# Patient Record
Sex: Female | Born: 1980 | Race: Black or African American | Hispanic: No | Marital: Married | Smoking: Never smoker
Health system: Southern US, Community
[De-identification: ages and names within clinical notes are randomized; demographics above are authoritative.]

## PROBLEM LIST (undated history)

## (undated) DIAGNOSIS — C50919 Malignant neoplasm of unspecified site of unspecified female breast: Secondary | ICD-10-CM

## (undated) DIAGNOSIS — K219 Gastro-esophageal reflux disease without esophagitis: Secondary | ICD-10-CM

## (undated) DIAGNOSIS — T148XXA Other injury of unspecified body region, initial encounter: Secondary | ICD-10-CM

## (undated) DIAGNOSIS — B019 Varicella without complication: Secondary | ICD-10-CM

## (undated) DIAGNOSIS — R112 Nausea with vomiting, unspecified: Secondary | ICD-10-CM

## (undated) DIAGNOSIS — D649 Anemia, unspecified: Secondary | ICD-10-CM

## (undated) DIAGNOSIS — B009 Herpesviral infection, unspecified: Secondary | ICD-10-CM

## (undated) DIAGNOSIS — Z923 Personal history of irradiation: Secondary | ICD-10-CM

## (undated) DIAGNOSIS — Z9889 Other specified postprocedural states: Secondary | ICD-10-CM

## (undated) DIAGNOSIS — C801 Malignant (primary) neoplasm, unspecified: Secondary | ICD-10-CM

## (undated) HISTORY — DX: Varicella without complication: B01.9

## (undated) HISTORY — PX: WISDOM TOOTH EXTRACTION: SHX21

---

## 2000-04-10 ENCOUNTER — Emergency Department (HOSPITAL_COMMUNITY): Admission: EM | Admit: 2000-04-10 | Discharge: 2000-04-10 | Payer: Self-pay | Admitting: Emergency Medicine

## 2000-04-11 ENCOUNTER — Emergency Department (HOSPITAL_COMMUNITY): Admission: EM | Admit: 2000-04-11 | Discharge: 2000-04-11 | Payer: Self-pay | Admitting: Emergency Medicine

## 2000-05-17 ENCOUNTER — Encounter: Payer: Self-pay | Admitting: Emergency Medicine

## 2000-05-17 ENCOUNTER — Emergency Department (HOSPITAL_COMMUNITY): Admission: EM | Admit: 2000-05-17 | Discharge: 2000-05-18 | Payer: Self-pay | Admitting: Emergency Medicine

## 2000-12-07 ENCOUNTER — Emergency Department (HOSPITAL_COMMUNITY): Admission: EM | Admit: 2000-12-07 | Discharge: 2000-12-07 | Payer: Self-pay

## 2000-12-07 ENCOUNTER — Encounter: Payer: Self-pay | Admitting: Emergency Medicine

## 2000-12-15 ENCOUNTER — Emergency Department (HOSPITAL_COMMUNITY): Admission: EM | Admit: 2000-12-15 | Discharge: 2000-12-15 | Payer: Self-pay | Admitting: Emergency Medicine

## 2000-12-18 ENCOUNTER — Encounter: Payer: Self-pay | Admitting: Orthopedic Surgery

## 2000-12-18 ENCOUNTER — Ambulatory Visit (HOSPITAL_COMMUNITY): Admission: RE | Admit: 2000-12-18 | Discharge: 2000-12-18 | Payer: Self-pay | Admitting: Orthopedic Surgery

## 2001-02-26 ENCOUNTER — Encounter: Admission: RE | Admit: 2001-02-26 | Discharge: 2001-05-27 | Payer: Self-pay | Admitting: Orthopedic Surgery

## 2003-03-30 ENCOUNTER — Other Ambulatory Visit: Admission: RE | Admit: 2003-03-30 | Discharge: 2003-03-30 | Payer: Self-pay | Admitting: Obstetrics and Gynecology

## 2003-07-12 ENCOUNTER — Emergency Department (HOSPITAL_COMMUNITY): Admission: EM | Admit: 2003-07-12 | Discharge: 2003-07-13 | Payer: Self-pay | Admitting: Emergency Medicine

## 2004-04-12 ENCOUNTER — Emergency Department (HOSPITAL_COMMUNITY): Admission: EM | Admit: 2004-04-12 | Discharge: 2004-04-12 | Payer: Self-pay

## 2006-04-09 ENCOUNTER — Emergency Department (HOSPITAL_COMMUNITY): Admission: EM | Admit: 2006-04-09 | Discharge: 2006-04-09 | Payer: Self-pay | Admitting: Family Medicine

## 2007-06-02 ENCOUNTER — Other Ambulatory Visit: Admission: RE | Admit: 2007-06-02 | Discharge: 2007-06-02 | Payer: Self-pay | Admitting: *Deleted

## 2009-09-19 ENCOUNTER — Emergency Department (HOSPITAL_COMMUNITY): Admission: EM | Admit: 2009-09-19 | Discharge: 2009-09-19 | Payer: Self-pay | Admitting: Emergency Medicine

## 2011-10-15 ENCOUNTER — Other Ambulatory Visit (HOSPITAL_COMMUNITY): Payer: Self-pay | Admitting: Obstetrics and Gynecology

## 2011-10-15 DIAGNOSIS — N979 Female infertility, unspecified: Secondary | ICD-10-CM

## 2011-10-21 ENCOUNTER — Ambulatory Visit (HOSPITAL_COMMUNITY)
Admission: RE | Admit: 2011-10-21 | Discharge: 2011-10-21 | Disposition: A | Discharge status: Home / Self Care | Payer: 59 | Source: Ambulatory Visit | Attending: Obstetrics and Gynecology | Admitting: Obstetrics and Gynecology

## 2011-10-21 DIAGNOSIS — N979 Female infertility, unspecified: Secondary | ICD-10-CM | POA: Insufficient documentation

## 2011-10-21 MED ORDER — IOHEXOL 300 MG/ML  SOLN
20.0000 mL | Freq: Once | INTRAMUSCULAR | Status: AC | PRN
  Administered 2011-10-21: 20 mL

## 2012-03-19 ENCOUNTER — Encounter (HOSPITAL_COMMUNITY): Payer: Self-pay | Admitting: *Deleted

## 2012-03-19 ENCOUNTER — Emergency Department (INDEPENDENT_AMBULATORY_CARE_PROVIDER_SITE_OTHER)
Admission: EM | Admit: 2012-03-19 | Discharge: 2012-03-19 | Disposition: A | Discharge status: Home / Self Care | Payer: 59 | Source: Home / Self Care | Attending: Emergency Medicine | Admitting: Emergency Medicine

## 2012-03-19 DIAGNOSIS — J209 Acute bronchitis, unspecified: Secondary | ICD-10-CM

## 2012-03-19 DIAGNOSIS — J019 Acute sinusitis, unspecified: Secondary | ICD-10-CM

## 2012-03-19 MED ORDER — BENZONATATE 200 MG PO CAPS: 200.0000 mg | ORAL_CAPSULE | Freq: Three times a day (TID) | ORAL | Status: DC | PRN

## 2012-03-19 MED ORDER — AMOXICILLIN 500 MG PO CAPS: 1000.0000 mg | ORAL_CAPSULE | Freq: Three times a day (TID) | ORAL | Status: DC

## 2012-03-19 NOTE — ED Notes (Signed)
Pt reports productive cough (yellow sputum), body aches, sinus pain and pressure - other family members sick with similar symptoms.

## 2012-03-19 NOTE — ED Provider Notes (Signed)
Chief Complaint  Patient presents with  . URI    History of Present Illness:   The patient is a 32 year old female who presents with a one half week history of cough productive yellow sputum, wheezing, nasal congestion, rhinorrhea, yellowish nasal drainage, headache, maxillary pressure, watering of her eyes, chills, and sore throat. She has not have fever and has had no GI complaints.  Review of Systems:  Other than noted above, the patient denies any of the following symptoms. Systemic:  No fever, chills, sweats, fatigue, myalgias, headache, or anorexia. Eye:  No redness, pain or drainage. ENT:  No earache, ear congestion, nasal congestion, sneezing, rhinorrhea, sinus pressure, sinus pain, post nasal drip, or sore throat. Lungs:  No cough, sputum production, wheezing, shortness of breath, or chest pain. GI:  No abdominal pain, nausea, vomiting, or diarrhea.  PMFSH:  Past medical history, family history, social history, meds, and allergies were reviewed.  Physical Exam:   Vital signs:  BP 109/78  Pulse 86  Temp 99.8 F (37.7 C) (Oral)  Resp 16  SpO2 100%  LMP 03/05/2012 General:  Alert, in no distress. Eye:  No conjunctival injection or drainage. Lids were normal. ENT:  TMs and canals were normal, without erythema or inflammation.  Nasal mucosa was clear and uncongested, without drainage.  Mucous membranes were moist.  Pharynx was clear, without exudate or drainage.  There were no oral ulcerations or lesions. Neck:  Supple, no adenopathy, tenderness or mass. Lungs:  No respiratory distress.  Lungs were clear to auscultation, without wheezes, rales or rhonchi.  Breath sounds were clear and equal bilaterally.  Heart:  Regular rhythm, without gallops, murmers or rubs. Skin:  Clear, warm, and dry, without rash or lesions.  Assessment:  The primary encounter diagnosis was Acute bronchitis. A diagnosis of Acute sinusitis was also pertinent to this visit.  Plan:   1.  The following meds  were prescribed:   New Prescriptions   AMOXICILLIN (AMOXIL) 500 MG CAPSULE    Take 2 capsules (1,000 mg total) by mouth 3 (three) times daily.   BENZONATATE (TESSALON) 200 MG CAPSULE    Take 1 capsule (200 mg total) by mouth 3 (three) times daily as needed for cough.   2.  The patient was instructed in symptomatic care and handouts were given. 3.  The patient was told to return if becoming worse in any way, if no better in 3 or 4 days, and given some red flag symptoms that would indicate earlier return.   Reuben Likes, MD 03/19/12 [id number]

## 2013-12-27 LAB — OB RESULTS CONSOLE HIV ANTIBODY (ROUTINE TESTING): HIV: NONREACTIVE

## 2013-12-27 LAB — OB RESULTS CONSOLE GC/CHLAMYDIA
CHLAMYDIA, DNA PROBE: NEGATIVE
GC PROBE AMP, GENITAL: NEGATIVE

## 2013-12-27 LAB — OB RESULTS CONSOLE RPR: RPR: NONREACTIVE

## 2013-12-27 LAB — OB RESULTS CONSOLE HEPATITIS B SURFACE ANTIGEN: HEP B S AG: NEGATIVE

## 2013-12-27 LAB — OB RESULTS CONSOLE ABO/RH: RH Type: POSITIVE

## 2013-12-27 LAB — OB RESULTS CONSOLE ANTIBODY SCREEN: ANTIBODY SCREEN: NEGATIVE

## 2013-12-27 LAB — OB RESULTS CONSOLE RUBELLA ANTIBODY, IGM: Rubella: IMMUNE

## 2014-03-18 NOTE — L&D Delivery Note (Signed)
Delivery Note At 11:42 PM a viable and healthy female was delivered via Vaginal, Spontaneous Delivery (Presentation: Right Occiput Anterior). Compound left hand  APGAR: 7, 9; weight 8 lb 4.6 oz (3760 g).   Placenta status: Intact, Spontaneous. Not sent Cord: 3 vessels with the following complications: .  Cord pH: n/a  Anesthesia: Epidural  Episiotomy: None Lacerations: 4th degree perineal; bilateral vaginal Sulcus, Suture Repair: 2.0 3.0 chromic vicryl Est. Blood Loss (mL): 1492 Distal fourth deg laceration repaired with )-O- vicryl x 4 figure of eight for sphincter, 4-0 Vicryl for serosa, 3-0 chromic for remaining perineal repair,  2-0 chromic for deep tissue. 3-0 chromic for remain skin closure  rectal exam intact mucosa Mom to postpartum.  Baby to skin to skin.  Hilton Saephan A 07/25/2014, 2:07 AM

## 2014-06-21 LAB — OB RESULTS CONSOLE GBS: GBS: NEGATIVE

## 2014-07-24 ENCOUNTER — Inpatient Hospital Stay (HOSPITAL_COMMUNITY): Payer: 59 | Admitting: Anesthesiology

## 2014-07-24 ENCOUNTER — Encounter (HOSPITAL_COMMUNITY): Payer: Self-pay | Admitting: *Deleted

## 2014-07-24 ENCOUNTER — Inpatient Hospital Stay (HOSPITAL_COMMUNITY)
Admission: AD | Admit: 2014-07-24 | Discharge: 2014-07-27 | DRG: 774 | Disposition: A | Discharge status: Home / Self Care | Payer: 59 | Source: Ambulatory Visit | Attending: Obstetrics and Gynecology | Admitting: Obstetrics and Gynecology

## 2014-07-24 DIAGNOSIS — O9832 Other infections with a predominantly sexual mode of transmission complicating childbirth: Secondary | ICD-10-CM | POA: Diagnosis present

## 2014-07-24 DIAGNOSIS — Z3A40 40 weeks gestation of pregnancy: Secondary | ICD-10-CM | POA: Diagnosis present

## 2014-07-24 DIAGNOSIS — O9081 Anemia of the puerperium: Secondary | ICD-10-CM | POA: Diagnosis not present

## 2014-07-24 DIAGNOSIS — D62 Acute posthemorrhagic anemia: Secondary | ICD-10-CM | POA: Diagnosis not present

## 2014-07-24 DIAGNOSIS — A6000 Herpesviral infection of urogenital system, unspecified: Secondary | ICD-10-CM | POA: Diagnosis present

## 2014-07-24 DIAGNOSIS — O480 Post-term pregnancy: Principal | ICD-10-CM | POA: Diagnosis present

## 2014-07-24 HISTORY — DX: Herpesviral infection, unspecified: B00.9

## 2014-07-24 LAB — TYPE AND SCREEN
ABO/RH(D): A POS
Antibody Screen: NEGATIVE

## 2014-07-24 LAB — CBC
HCT: 38.1 % (ref 36.0–46.0)
HEMOGLOBIN: 13 g/dL (ref 12.0–15.0)
MCH: 28 pg (ref 26.0–34.0)
MCHC: 34.1 g/dL (ref 30.0–36.0)
MCV: 81.9 fL (ref 78.0–100.0)
Platelets: 241 10*3/uL (ref 150–400)
RBC: 4.65 MIL/uL (ref 3.87–5.11)
RDW: 15.2 % (ref 11.5–15.5)
WBC: 15 10*3/uL — ABNORMAL HIGH (ref 4.0–10.5)

## 2014-07-24 LAB — ABO/RH: ABO/RH(D): A POS

## 2014-07-24 MED ORDER — DIPHENHYDRAMINE HCL 50 MG/ML IJ SOLN: 12.5000 mg | INTRAMUSCULAR | Status: DC | PRN

## 2014-07-24 MED ORDER — OXYCODONE-ACETAMINOPHEN 5-325 MG PO TABS: 1.0000 | ORAL_TABLET | ORAL | Status: DC | PRN

## 2014-07-24 MED ORDER — FLEET ENEMA 7-19 GM/118ML RE ENEM: 1.0000 | ENEMA | RECTAL | Status: DC | PRN

## 2014-07-24 MED ORDER — PHENYLEPHRINE 40 MCG/ML (10ML) SYRINGE FOR IV PUSH (FOR BLOOD PRESSURE SUPPORT)
PREFILLED_SYRINGE | INTRAVENOUS | Status: AC
  Filled 2014-07-24: qty 20

## 2014-07-24 MED ORDER — TERBUTALINE SULFATE 1 MG/ML IJ SOLN: 0.2500 mg | Freq: Once | INTRAMUSCULAR | Status: AC | PRN

## 2014-07-24 MED ORDER — ACETAMINOPHEN 325 MG PO TABS
650.0000 mg | ORAL_TABLET | ORAL | Status: DC | PRN
Start: 2014-07-24 — End: 2014-07-25

## 2014-07-24 MED ORDER — OXYTOCIN 40 UNITS IN LACTATED RINGERS INFUSION - SIMPLE MED
62.5000 mL/h | INTRAVENOUS | Status: DC
  Filled 2014-07-24: qty 1000

## 2014-07-24 MED ORDER — FENTANYL 2.5 MCG/ML BUPIVACAINE 1/10 % EPIDURAL INFUSION (WH - ANES)
INTRAMUSCULAR | Status: AC
  Filled 2014-07-24: qty 125

## 2014-07-24 MED ORDER — LACTATED RINGERS IV SOLN
INTRAVENOUS | Status: DC
  Administered 2014-07-24 (×3): via INTRAVENOUS

## 2014-07-24 MED ORDER — PHENYLEPHRINE 40 MCG/ML (10ML) SYRINGE FOR IV PUSH (FOR BLOOD PRESSURE SUPPORT): 80.0000 ug | PREFILLED_SYRINGE | INTRAVENOUS | Status: DC | PRN

## 2014-07-24 MED ORDER — EPHEDRINE 5 MG/ML INJ: 10.0000 mg | INTRAVENOUS | Status: DC | PRN

## 2014-07-24 MED ORDER — LACTATED RINGERS IV SOLN
INTRAVENOUS | Status: DC
  Administered 2014-07-24 (×2): via INTRAUTERINE

## 2014-07-24 MED ORDER — BUPIVACAINE HCL (PF) 0.25 % IJ SOLN
INTRAMUSCULAR | Status: DC | PRN
  Administered 2014-07-24 (×2): 4 mL

## 2014-07-24 MED ORDER — CITRIC ACID-SODIUM CITRATE 334-500 MG/5ML PO SOLN
30.0000 mL | ORAL | Status: DC | PRN
Start: 2014-07-24 — End: 2014-07-25

## 2014-07-24 MED ORDER — ONDANSETRON HCL 4 MG/2ML IJ SOLN
4.0000 mg | Freq: Four times a day (QID) | INTRAMUSCULAR | Status: DC | PRN
  Administered 2014-07-25: 4 mg via INTRAVENOUS
  Filled 2014-07-24: qty 2

## 2014-07-24 MED ORDER — LIDOCAINE-EPINEPHRINE (PF) 2 %-1:200000 IJ SOLN
INTRAMUSCULAR | Status: DC | PRN
  Administered 2014-07-24: 3 mL

## 2014-07-24 MED ORDER — OXYTOCIN BOLUS FROM INFUSION
500.0000 mL | INTRAVENOUS | Status: DC
  Administered 2014-07-24 – 2014-07-25 (×2): 500 mL via INTRAVENOUS
  Administered 2014-07-25: 200 mL via INTRAVENOUS

## 2014-07-24 MED ORDER — MISOPROSTOL 25 MCG QUARTER TABLET: 25.0000 ug | ORAL_TABLET | ORAL | Status: DC | PRN

## 2014-07-24 MED ORDER — LACTATED RINGERS IV SOLN
500.0000 mL | INTRAVENOUS | Status: DC | PRN
  Administered 2014-07-24 (×3): 500 mL via INTRAVENOUS

## 2014-07-24 MED ORDER — FENTANYL 2.5 MCG/ML BUPIVACAINE 1/10 % EPIDURAL INFUSION (WH - ANES)
14.0000 mL/h | INTRAMUSCULAR | Status: DC | PRN
  Administered 2014-07-24: 14 mL/h via EPIDURAL
  Administered 2014-07-24: 12 mL/h via EPIDURAL
  Administered 2014-07-25: 14 mL/h via EPIDURAL
  Filled 2014-07-24: qty 125

## 2014-07-24 MED ORDER — OXYTOCIN 40 UNITS IN LACTATED RINGERS INFUSION - SIMPLE MED
1.0000 m[IU]/min | INTRAVENOUS | Status: DC
  Administered 2014-07-24: 4 m[IU]/min via INTRAVENOUS
  Administered 2014-07-24: 2 m[IU]/min via INTRAVENOUS
  Filled 2014-07-24: qty 1000

## 2014-07-24 MED ORDER — OXYCODONE-ACETAMINOPHEN 5-325 MG PO TABS: 2.0000 | ORAL_TABLET | ORAL | Status: DC | PRN

## 2014-07-24 MED ORDER — LIDOCAINE HCL (PF) 1 % IJ SOLN
30.0000 mL | INTRAMUSCULAR | Status: DC | PRN
  Administered 2014-07-24: 30 mL via SUBCUTANEOUS
  Filled 2014-07-24: qty 30

## 2014-07-24 NOTE — Progress Notes (Signed)
S. No complaint  O:  Pitocin Epidural  VE LOP 5/90/-1 Mod mec (+) bloody show  Tracing: cat 1 baseline 130's Ctx 1 1/2- 48mins Up to 170 MVU  IMP protracted active phase due to sub optimal ctx and posterior presentation P) cont pitocin cont exaggerated sims position

## 2014-07-24 NOTE — H&P (Signed)
Brianna James is a 34 y.o. female presenting @ term in active labor. GBS cx neg. Intact membrane  Maternal Medical History:  Reason for admission: Contractions.   Contractions: Onset was 6-12 hours ago.   Frequency: regular.   Perceived severity is moderate.    Fetal activity: Perceived fetal activity is normal.    Prenatal complications: no prenatal complications   OB History    Gravida Para Term Preterm AB TAB SAB Ectopic Multiple Living   1              Past Medical History  Diagnosis Date  . HSV infection    Past Surgical History  Procedure Laterality Date  . Wisdom tooth extraction     Family History: Family history is unknown by patient. Social History:  reports that she has never smoked. She does not have any smokeless tobacco history on file. She reports that she does not drink alcohol or use illicit drugs.   Prenatal Transfer Tool  Maternal Diabetes: No Genetic Screening: Normal Maternal Ultrasounds/Referrals: Abnormal:  Findings:   Isolated EIF (echogenic intracardiac focus) Fetal Ultrasounds or other Referrals:  None Maternal Substance Abuse:  No Significant Maternal Medications:  Meds include: Other: valtrex Significant Maternal Lab Results:  Lab values include: Group B Strep negative Other Comments:  None  Review of Systems  All other systems reviewed and are negative.   Dilation: 4.5 Effacement (%): 80 Station: -1, -2 Exam by:: Millie Forde Blood pressure 113/64, pulse 104, temperature 98.5 F (36.9 C), temperature source Oral, resp. rate 22, height 5\' 7"  (1.702 m), weight 110.678 kg (244 lb). Maternal Exam:  Uterine Assessment: Contraction strength is moderate.  Abdomen: Patient reports no abdominal tenderness. Estimated fetal weight is 8 lb.   Fetal presentation: vertex  Introitus: Normal vulva.   Physical Exam  Vitals reviewed. Constitutional: She is oriented to person, place, and time. She appears well-developed and well-nourished.  HENT:   Head: Atraumatic.  Eyes: EOM are normal.  Neck: Neck supple.  Cardiovascular: Regular rhythm.   Musculoskeletal: She exhibits edema.  Neurological: She is alert and oriented to person, place, and time.  Skin: Skin is warm and dry.  Psychiatric: She has a normal mood and affect.    Prenatal labs: ABO, Rh: --/--/A POS (05/08 1640) Antibody: NEG (05/08 1640) Rubella: Immune (10/12 0000) RPR: Nonreactive (10/12 0000)  HBsAg: Negative (10/12 0000)  HIV: Non-reactive (10/12 0000)  GBS: Negative (04/05 0000)   Assessment/Plan: Active labor Term gestation P) admit routine labs. Epidural. Pitocin prn. amniotomy   Jamon Hayhurst A 07/24/2014, 5:44 PM

## 2014-07-24 NOTE — Progress Notes (Signed)
S: breathing with ctx Epidural completed  O: VE 4-5/80/-2/-1 AROM  Med mec IUPC placed LOP/LOt  Tracing: baseline 130 (+) accel to 145 Ctx q 2-3 mins( 20-40 mvu  IMP: Active labor P) right exaggerated sims. Amnioinfusion. Pitocin prn

## 2014-07-24 NOTE — Anesthesia Procedure Notes (Signed)
Epidural Patient location during procedure: OB  Staffing Anesthesiologist: Deddrick Saindon, CHRIS Performed by: anesthesiologist   Preanesthetic Checklist Completed: patient identified, surgical consent, pre-op evaluation, timeout performed, IV checked, risks and benefits discussed and monitors and equipment checked  Epidural Patient position: sitting Prep: site prepped and draped and DuraPrep Patient monitoring: heart rate, cardiac monitor, continuous pulse ox and blood pressure Approach: midline Location: L3-L4 Injection technique: LOR saline  Needle:  Needle type: Tuohy  Needle gauge: 17 G Needle length: 9 cm Needle insertion depth: 6 cm Catheter type: closed end flexible Catheter size: 19 Gauge Catheter at skin depth: 13 cm Test dose: negative and 2% lidocaine with Epi 1:200 K  Assessment Events: blood not aspirated, injection not painful, no injection resistance, negative IV test and no paresthesia  Additional Notes H+P and labs checked, risks and benefits discussed with the patient, consent obtained, procedure tolerated well and without complications.  Reason for block:procedure for pain

## 2014-07-24 NOTE — Anesthesia Preprocedure Evaluation (Signed)
Anesthesia Evaluation  Patient identified by MRN, date of birth, ID band Patient awake    Reviewed: Allergy & Precautions, NPO status , Patient's Chart, lab work & pertinent test results  History of Anesthesia Complications Negative for: history of anesthetic complications  Airway Mallampati: II  TM Distance: >3 FB Neck ROM: Full    Dental  (+) Teeth Intact   Pulmonary neg pulmonary ROS,  breath sounds clear to auscultation        Cardiovascular negative cardio ROS  Rhythm:Regular     Neuro/Psych negative neurological ROS  negative psych ROS   GI/Hepatic negative GI ROS, Neg liver ROS,   Endo/Other  Morbid obesity  Renal/GU negative Renal ROS     Musculoskeletal   Abdominal   Peds  Hematology   Anesthesia Other Findings   Reproductive/Obstetrics (+) Pregnancy                             Anesthesia Physical Anesthesia Plan  ASA: II  Anesthesia Plan: Epidural   Post-op Pain Management:    Induction:   Airway Management Planned:   Additional Equipment:   Intra-op Plan:   Post-operative Plan:   Informed Consent: I have reviewed the patients History and Physical, chart, labs and discussed the procedure including the risks, benefits and alternatives for the proposed anesthesia with the patient or authorized representative who has indicated his/her understanding and acceptance.     Plan Discussed with: Anesthesiologist  Anesthesia Plan Comments:         Anesthesia Quick Evaluation

## 2014-07-25 ENCOUNTER — Encounter (HOSPITAL_COMMUNITY): Payer: Self-pay | Admitting: *Deleted

## 2014-07-25 LAB — CBC WITH DIFFERENTIAL/PLATELET
BASOS ABS: 0 10*3/uL (ref 0.0–0.1)
BASOS PCT: 0 % (ref 0–1)
Eosinophils Absolute: 0.1 10*3/uL (ref 0.0–0.7)
Eosinophils Relative: 0 % (ref 0–5)
HCT: 30.3 % — ABNORMAL LOW (ref 36.0–46.0)
Hemoglobin: 10 g/dL — ABNORMAL LOW (ref 12.0–15.0)
Lymphocytes Relative: 15 % (ref 12–46)
Lymphs Abs: 2.9 10*3/uL (ref 0.7–4.0)
MCH: 27 pg (ref 26.0–34.0)
MCHC: 33 g/dL (ref 30.0–36.0)
MCV: 81.7 fL (ref 78.0–100.0)
Monocytes Absolute: 1.1 10*3/uL — ABNORMAL HIGH (ref 0.1–1.0)
Monocytes Relative: 6 % (ref 3–12)
Neutro Abs: 14.6 10*3/uL — ABNORMAL HIGH (ref 1.7–7.7)
Neutrophils Relative %: 79 % — ABNORMAL HIGH (ref 43–77)
PLATELETS: 198 10*3/uL (ref 150–400)
RBC: 3.71 MIL/uL — AB (ref 3.87–5.11)
RDW: 15 % (ref 11.5–15.5)
WBC: 18.6 10*3/uL — ABNORMAL HIGH (ref 4.0–10.5)

## 2014-07-25 LAB — CBC
HEMATOCRIT: 34.7 % — AB (ref 36.0–46.0)
Hemoglobin: 11.6 g/dL — ABNORMAL LOW (ref 12.0–15.0)
MCH: 27.1 pg (ref 26.0–34.0)
MCHC: 33.4 g/dL (ref 30.0–36.0)
MCV: 81.1 fL (ref 78.0–100.0)
Platelets: 219 10*3/uL (ref 150–400)
RBC: 4.28 MIL/uL (ref 3.87–5.11)
RDW: 15.1 % (ref 11.5–15.5)
WBC: 22.8 10*3/uL — AB (ref 4.0–10.5)

## 2014-07-25 LAB — RPR: RPR Ser Ql: NONREACTIVE

## 2014-07-25 MED ORDER — METHYLERGONOVINE MALEATE 0.2 MG/ML IJ SOLN
0.2000 mg | Freq: Once | INTRAMUSCULAR | Status: AC
  Administered 2014-07-25: 0.2 mg via INTRAMUSCULAR

## 2014-07-25 MED ORDER — METHYLERGONOVINE MALEATE 0.2 MG/ML IJ SOLN
INTRAMUSCULAR | Status: AC
  Administered 2014-07-25: 0.2 mg via INTRAMUSCULAR
  Filled 2014-07-25: qty 1

## 2014-07-25 MED ORDER — SODIUM CHLORIDE 0.9 % IV SOLN: 250.0000 mL | INTRAVENOUS | Status: DC | PRN

## 2014-07-25 MED ORDER — METHYLERGONOVINE MALEATE 0.2 MG/ML IJ SOLN: 0.2000 mg | Freq: Four times a day (QID) | INTRAMUSCULAR | Status: DC

## 2014-07-25 MED ORDER — DIPHENHYDRAMINE HCL 25 MG PO CAPS: 25.0000 mg | ORAL_CAPSULE | Freq: Four times a day (QID) | ORAL | Status: DC | PRN

## 2014-07-25 MED ORDER — MAGNESIUM OXIDE 400 (241.3 MG) MG PO TABS
400.0000 mg | ORAL_TABLET | Freq: Every day | ORAL | Status: DC
  Administered 2014-07-25 – 2014-07-27 (×3): 400 mg via ORAL
  Filled 2014-07-25 (×5): qty 1

## 2014-07-25 MED ORDER — OXYCODONE-ACETAMINOPHEN 5-325 MG PO TABS
2.0000 | ORAL_TABLET | ORAL | Status: DC | PRN
  Administered 2014-07-25 – 2014-07-27 (×10): 2 via ORAL
  Filled 2014-07-25 (×10): qty 2

## 2014-07-25 MED ORDER — ACETAMINOPHEN 325 MG PO TABS: 650.0000 mg | ORAL_TABLET | ORAL | Status: DC | PRN

## 2014-07-25 MED ORDER — DOCUSATE SODIUM 100 MG PO CAPS
100.0000 mg | ORAL_CAPSULE | Freq: Two times a day (BID) | ORAL | Status: DC
  Administered 2014-07-25 – 2014-07-27 (×5): 100 mg via ORAL
  Filled 2014-07-25 (×5): qty 1

## 2014-07-25 MED ORDER — SODIUM CHLORIDE 0.9 % IJ SOLN: 3.0000 mL | INTRAMUSCULAR | Status: DC | PRN

## 2014-07-25 MED ORDER — SODIUM CHLORIDE 0.9 % IJ SOLN: 3.0000 mL | Freq: Two times a day (BID) | INTRAMUSCULAR | Status: DC

## 2014-07-25 MED ORDER — WITCH HAZEL-GLYCERIN EX PADS: 1.0000 "application " | MEDICATED_PAD | CUTANEOUS | Status: DC | PRN

## 2014-07-25 MED ORDER — ONDANSETRON HCL 4 MG/2ML IJ SOLN: 4.0000 mg | INTRAMUSCULAR | Status: DC | PRN

## 2014-07-25 MED ORDER — ZOLPIDEM TARTRATE 5 MG PO TABS: 5.0000 mg | ORAL_TABLET | Freq: Every evening | ORAL | Status: DC | PRN

## 2014-07-25 MED ORDER — LANOLIN HYDROUS EX OINT: TOPICAL_OINTMENT | CUTANEOUS | Status: DC | PRN

## 2014-07-25 MED ORDER — POLYSACCHARIDE IRON COMPLEX 150 MG PO CAPS: 150.0000 mg | ORAL_CAPSULE | Freq: Two times a day (BID) | ORAL | Status: DC

## 2014-07-25 MED ORDER — OXYCODONE-ACETAMINOPHEN 5-325 MG PO TABS
1.0000 | ORAL_TABLET | ORAL | Status: DC | PRN
  Administered 2014-07-25 (×2): 1 via ORAL
  Filled 2014-07-25 (×2): qty 1

## 2014-07-25 MED ORDER — BENZOCAINE-MENTHOL 20-0.5 % EX AERO
1.0000 "application " | INHALATION_SPRAY | CUTANEOUS | Status: DC | PRN
  Administered 2014-07-26 – 2014-07-27 (×2): 1 via TOPICAL
  Filled 2014-07-25 (×4): qty 56

## 2014-07-25 MED ORDER — IBUPROFEN 600 MG PO TABS
600.0000 mg | ORAL_TABLET | Freq: Four times a day (QID) | ORAL | Status: DC
  Administered 2014-07-25 – 2014-07-27 (×10): 600 mg via ORAL
  Filled 2014-07-25 (×10): qty 1

## 2014-07-25 MED ORDER — PRENATAL MULTIVITAMIN CH
1.0000 | ORAL_TABLET | Freq: Every day | ORAL | Status: DC
  Administered 2014-07-25 – 2014-07-27 (×3): 1 via ORAL
  Filled 2014-07-25 (×3): qty 1

## 2014-07-25 MED ORDER — POLYSACCHARIDE IRON COMPLEX 150 MG PO CAPS
150.0000 mg | ORAL_CAPSULE | Freq: Every day | ORAL | Status: DC
  Administered 2014-07-26 – 2014-07-27 (×2): 150 mg via ORAL
  Filled 2014-07-25 (×2): qty 1

## 2014-07-25 MED ORDER — SENNOSIDES-DOCUSATE SODIUM 8.6-50 MG PO TABS
2.0000 | ORAL_TABLET | ORAL | Status: DC
  Administered 2014-07-26 (×2): 2 via ORAL
  Filled 2014-07-25 (×2): qty 2

## 2014-07-25 MED ORDER — SIMETHICONE 80 MG PO CHEW
80.0000 mg | CHEWABLE_TABLET | ORAL | Status: DC | PRN
  Administered 2014-07-25 – 2014-07-26 (×2): 80 mg via ORAL
  Filled 2014-07-25: qty 1

## 2014-07-25 MED ORDER — DIBUCAINE 1 % RE OINT
1.0000 "application " | TOPICAL_OINTMENT | RECTAL | Status: DC | PRN
  Filled 2014-07-25: qty 28

## 2014-07-25 MED ORDER — ONDANSETRON HCL 4 MG PO TABS: 4.0000 mg | ORAL_TABLET | ORAL | Status: DC | PRN

## 2014-07-25 MED ORDER — METHYLERGONOVINE MALEATE 0.2 MG PO TABS
0.2000 mg | ORAL_TABLET | Freq: Four times a day (QID) | ORAL | Status: DC
  Administered 2014-07-25: 0.2 mg via ORAL
  Filled 2014-07-25: qty 1

## 2014-07-25 NOTE — Progress Notes (Signed)
Pt's Epidural cath still in. Artelia Laroche CNM called. Message left to call RN to see if we could remove it.  Marland Kitchen

## 2014-07-25 NOTE — Lactation Note (Signed)
This note was copied from the chart of [place]. Lactation Consultation Note; Lactation Brochure given with information on [facility] services. Assist mother with latching infant . Infant sustained latch for 25 mins and then latched to alternate breast in football hold for another 10 mins. Observed audible swallows. Basic teaching from [place] and me book. Advised mother to feed infant 8-12 times in 24 hours. Reviewed cue card and cluster feeding. Mother taught hand expression. Observed colostrum. Mother is very sleepy. She has had pain meds. Father of baby at bedside for teaching. Mother had a PP blood loss of 1400 cc. Will discuss possible  post pumping to protect mother's milk supply.   Patient Name: Brianna James [id number] Date: 07/25/2014 Reason for consult: Initial assessment   Maternal Data    Feeding Feeding Type: Breast Fed Length of feed: 25 min  LATCH Score/Interventions Latch: Grasps breast easily, tongue down, lips flanged, rhythmical sucking.  Audible Swallowing: Spontaneous and intermittent  Type of Nipple: Everted at rest and after stimulation  Comfort (Breast/Nipple): Soft / non-tender     Hold (Positioning): Assistance needed to correctly position infant at breast and maintain latch. Intervention(s): Breastfeeding basics reviewed;Support Pillows;Position options;Skin to skin  LATCH Score: 9  Lactation Tools Discussed/Used     Consult Status Consult Status: Follow-up Date: 07/25/14 Follow-up type: In-patient    Jess Barters [facility] 07/25/2014, 3:44 PM

## 2014-07-25 NOTE — Anesthesia Postprocedure Evaluation (Signed)
Anesthesia Post Note  Patient: Brianna James  Procedure(s) Performed: * No procedures listed *  Anesthesia type: Epidural  Patient location: Mother/Baby  Post pain: Pain level controlled  Post assessment: Post-op Vital signs reviewed  Last Vitals:  Filed Vitals:   07/25/14 1812  BP: 99/46  Pulse: 78  Temp: 36.9 C  Resp: 18    Post vital signs: Reviewed  Level of consciousness:alert  Complications: No apparent anesthesia complications

## 2014-07-25 NOTE — Addendum Note (Signed)
Addendum  created 07/25/14 2051 by Flossie Dibble, CRNA   Modules edited: Charges VN, Notes Section   Notes Section:  File: [id number]

## 2014-07-25 NOTE — Anesthesia Postprocedure Evaluation (Signed)
  Anesthesia Post-op Note  Patient: Brianna James  Procedure(s) Performed: * No procedures listed *  Patient Location: Mother/Baby  Anesthesia Type:Epidural  Level of Consciousness: awake, alert , oriented and patient cooperative  Airway and Oxygen Therapy: Patient Spontanous Breathing  Post-op Pain: moderate  Post-op Assessment: Post-op Vital signs reviewed, Patient's Cardiovascular Status Stable, Respiratory Function Stable, Patent Airway, No headache, No backache, No residual numbness and No residual motor weakness  Post-op Vital Signs: Reviewed and stable  Last Vitals:  Filed Vitals:   07/25/14 0930  BP: 112/57  Pulse: 97  Temp: 36.9 C  Resp: 18    Complications: No apparent anesthesia complications

## 2014-07-26 NOTE — Progress Notes (Signed)
PPD #1- SVD  Subjective:   Reports feeling ok, bottom sore Tolerating po/ No nausea or vomiting Bleeding is light Pain controlled with Motrin and Percocet Up ad lib / ambulatory / voiding without problems Newborn: breastfeeding  / Circumcision: done   Objective:   VS:  VS:  Filed Vitals:   07/25/14 0550 07/25/14 0930 07/25/14 1812 07/26/14 0524  BP: 112/64 112/57 99/46 107/53  Pulse: 106 97 78 78  Temp: 98.6 F (37 C) 98.4 F (36.9 C) 98.4 F (36.9 C) 97.9 F (36.6 C)  TempSrc: Oral Oral Oral Oral  Resp: 18 18 18 18   Height:      Weight:      SpO2:        LABS:  Recent Labs  07/25/14 0214 07/25/14 0822  WBC 22.8* 18.6*  HGB 11.6* 10.0*  PLT 219 198   Blood type: --/--/A POS, A POS (05/08 1640) Rubella: Immune (10/12 0000)   I&O: Intake/Output      05/09 0701 - 05/10 0700 05/10 0701 - 05/11 0700   Urine (mL/kg/hr)     Emesis/NG output     Blood     Total Output       Net              Physical Exam: Alert and oriented x3 Abdomen: soft, non-tender, non-distended  Fundus: firm, non-tender, U-2 Perineum: Well approximated, no significant erythema, edema, or drainage; healing well. Ice pk in place. Lochia: small Extremities: 1+ BLE edema, no calf pain or tenderness; TEDs on    Assessment:  PPD #1 G1P1001/ S/P:spontaneous vaginal, 4th degree laceration PPH-stable  ABL anemia Doing well    Plan: Increase water intake, avoid cafeinated beverages, increase dietary fiber (fruits and veggies) Continue routine post partum orders Anticipate D/C home tomorrow Bowel regimen x8 wks   Denaly Gatling, N MSN, CNM 07/26/2014, 9:21 AM

## 2014-07-26 NOTE — Progress Notes (Signed)
Called Brianna James, CNM requesting information about if epidural can be removed yet. Patient stated this morning that she was told that decision would be made once Dr. Garwin Brothers came in to see the patient; however, Dr. Garwin Brothers did the circumcision on the baby this morning but has not yet seen the patient. Patient asking about when it could come out. [name] stated that epidural and NSL could be removed since the patient is having no issues or need for them any longer. [name], Leretha Dykes [place]

## 2014-07-26 NOTE — Lactation Note (Signed)
This note was copied from the chart of Brianna James. Lactation Consultation Note: Mother states she is slightly sore. She is asking for lanolin. Advised mother in the use of hand expressed colostrum after each feeding and the use of comfort gels.  Discussed use of a hand pump or an electric pump after breastfeeding to protect mother's milk supply due to mother's loss of blood at delivery. Mother receptive to teaching. Staff nurse to sat up DEBP / handpump at patients request. Mother does have an electric pump at home. Advised mother to continue to feed infant 8-12 times daily.   Patient Name: [name] Date: 07/26/2014 Reason for consult: Follow-up assessment   Maternal Data    Feeding    LATCH Score/Interventions                      Lactation Tools Discussed/Used     Consult Status Consult Status: Follow-up Date: 07/26/14 Follow-up type: In-patient    Jess Barters [facility] 07/26/2014, 10:29 AM

## 2014-07-26 NOTE — Progress Notes (Signed)
In to see patient - patient requests to leave epidural catheter in place until in AM CRNA in x 2 to see patient - no plan or recommendation for removal noted.  Nurse notified - ok to leave in place until in am - anesthesia to remove in am  Artelia Laroche CNM [facility]

## 2014-07-27 ENCOUNTER — Encounter (HOSPITAL_COMMUNITY): Payer: Self-pay | Admitting: Obstetrics and Gynecology

## 2014-07-27 MED ORDER — POLYETHYLENE GLYCOL 3350 17 GM/SCOOP PO POWD: 1.0000 | Freq: Once | ORAL | Status: DC

## 2014-07-27 MED ORDER — OXYCODONE-ACETAMINOPHEN 5-325 MG PO TABS: 1.0000 | ORAL_TABLET | ORAL | Status: DC | PRN

## 2014-07-27 MED ORDER — DOCUSATE SODIUM 100 MG PO CAPS: 100.0000 mg | ORAL_CAPSULE | Freq: Two times a day (BID) | ORAL | Status: DC

## 2014-07-27 MED ORDER — IBUPROFEN 600 MG PO TABS: 600.0000 mg | ORAL_TABLET | Freq: Four times a day (QID) | ORAL | Status: DC

## 2014-07-27 MED ORDER — MAGNESIUM OXIDE 400 (241.3 MG) MG PO TABS: 400.0000 mg | ORAL_TABLET | Freq: Every day | ORAL | Status: DC

## 2014-07-27 MED ORDER — POLYSACCHARIDE IRON COMPLEX 150 MG PO CAPS: 150.0000 mg | ORAL_CAPSULE | Freq: Every day | ORAL | Status: DC

## 2014-07-27 NOTE — Discharge Summary (Signed)
Obstetric Discharge Summary  Reason for Admission: Pt is a G1P1001 at [redacted]w[redacted]d admitted in active labor.  Patient has received care at [facility] since 8.4 wks, with Dr. Garwin Brothers as primary provider.  Medications on Admission: Prescriptions prior to admission  Medication Sig Dispense Refill Last Dose  . Prenatal Vit-Fe Fumarate-FA (PRENATAL MULTIVITAMIN) TABS tablet Take 1 tablet by mouth daily.   07/23/2014 at Unknown time  . [DISCONTINUED] amoxicillin (AMOXIL) 500 MG capsule Take 2 capsules (1,000 mg total) by mouth 3 (three) times daily. (Patient not taking: Reported on 07/24/2014) 60 capsule 0   . [DISCONTINUED] benzonatate (TESSALON) 200 MG capsule Take 1 capsule (200 mg total) by mouth 3 (three) times daily as needed for cough. (Patient not taking: Reported on 07/24/2014) 30 capsule 0     Prenatal Labs: ABO, Rh: A POS (05/08 1640)  Antibody: NEG (05/08 1640) Rubella: Immune (10/12 0000)   RPR: Non Reactive (05/08 1640)  HBsAg: Negative (10/12 0000)  HIV: Non-reactive (10/12 0000)  GTT : Normal - 94 mg/dL GBS: Negative (04/05 0000)   Prenatal Procedures: ultrasound Intrapartum Course: Admitted in active labor / epidural for pain management / slow progression to complete dilation d/t LOP position of fetal head / SVD of viable female with extensive 4th degree repair by Dr. Garwin Brothers / epidural catheter indwelling x 48+ hrs - pulled 5/10 without difficulty / no immediate postpartum complications Intrapartum Procedures: spontaneous vaginal delivery Postpartum Procedures: none Complications-Operative and Postpartum: 4th degree perineal laceration and hemorrhage  Labs: HEMOGLOBIN  Date Value Ref Range Status  07/25/2014 10.0* 12.0 - 15.0 g/dL Final   HCT  Date Value Ref Range Status  07/25/2014 30.3* 36.0 - 46.0 % Final   Lab Results  Component Value Date   PLT 198 07/25/2014    Newborn Data: Live born female  Birth Weight: 8 lb 4.6 oz (3760 g) APGAR: 7, 9  Home with  mother.   Discharge Information: Date: 07/27/2014 Discharge Diagnoses:  Pt is a G1P1001 at [redacted]w[redacted]d S/P Term Pregnancy-delivered on 07/25/2014  Condition: stable Activity: pelvic rest Diet: routine Medications:    Medication List    TAKE these medications        docusate sodium 100 MG capsule  Commonly known as:  COLACE  Take 1 capsule (100 mg total) by mouth 2 (two) times daily.     ibuprofen 600 MG tablet  Commonly known as:  ADVIL,MOTRIN  Take 1 tablet (600 mg total) by mouth every 6 (six) hours.     iron polysaccharides 150 MG capsule  Commonly known as:  NIFEREX  Take 1 capsule (150 mg total) by mouth daily.     magnesium oxide 400 (241.3 MG) MG tablet  Commonly known as:  MAG-OX  Take 1 tablet (400 mg total) by mouth daily.     oxyCODONE-acetaminophen 5-325 MG per tablet  Commonly known as:  PERCOCET/ROXICET  Take 1 tablet by mouth every 4 (four) hours as needed (for pain scale 4-7).     polyethylene glycol powder powder  Commonly known as:  GLYCOLAX/MIRALAX  Take 255 g by mouth once.     prenatal multivitamin Tabs tablet  Take 1 tablet by mouth daily.       Instructions: [facility] OB[facility]GYN instruction booklet has been given and reviewed Discharge to: home     Follow-up Information    Follow up with COUSINS,SHERONETTE A, MD. Schedule an appointment as soon as possible for a visit in 2 weeks.   Specialty:  Obstetrics and  Gynecology   Why:  For wound re-check   Contact information:   [address] [place] [address] [phone]       Follow up with COUSINS,SHERONETTE A, MD. Schedule an appointment as soon as possible for a visit in 6 weeks.   Specialty:  Obstetrics and Gynecology   Why:  postpartum visit   Contact information:   [address] [place] [address] [place], [place], Jerilynn Mages, MSN, CNM 07/27/2014, 10:35 AM

## 2014-07-27 NOTE — Discharge Instructions (Signed)
Nutrition for the New Mother  °A new mother needs good health and nutrition so she can have energy to take care of a new baby. Whether a mother breastfeeds or formula feeds the baby, it is important to have a well-balanced diet. Foods from all the food groups should be chosen to meet the new mother's energy needs and to give her the nutrients needed for repair and healing.  °A HEALTHY EATING PLAN °The My Pyramid plan for Moms outlines what you should eat to help you and your baby stay healthy. The energy and amount of food you need depends on whether or not you are breastfeeding. If you are breastfeeding you will need more nutrients. If you choose not to breastfeed, your nutrition goal should be to return to a healthy weight. Limiting calories may be needed if you are not breastfeeding.  °HOME CARE INSTRUCTIONS  °· For a personal plan based on your unique needs, see your Registered Dietitian or visit www.mypyramid.gov. °· Eat a variety of foods. The plan below will help guide you. The following chart has a suggested daily meal plan from the My Pyramid for Moms. °· Eat a variety of fruits and vegetables. °· Eat more dark green and orange vegetables and cooked dried beans. °· Make half your grains whole grains. Choose whole instead of refined grains. °· Choose low-fat or lean meats and poultry. °· Choose low-fat or fat-free dairy products like milk, cheese, or yogurt. °Fruits °· Breastfeeding: 2 cups °· Non-Breastfeeding: 2 cups °· What Counts as a serving? °¨ 1 cup of fruit or juice. °¨ ½ cup dried fruit. °Vegetables °· Breastfeeding: 3 cups °· Non-Breastfeeding: 2 ½ cups °· What Counts as a serving? °¨ 1 cup raw or cooked vegetables. °¨ Juice or 2 cups raw leafy vegetables. °Grains °· Breastfeeding: 8 oz °· Non-Breastfeeding: 6 oz °· What Counts as a serving? °¨ 1 slice bread. °¨ 1 oz ready-to-eat cereal. °¨ ½ cup cooked pasta, rice, or cereal. °Meat and Beans °· Breastfeeding: 6 ½ oz °· Non-Breastfeeding: 5 ½  oz °· What Counts as a serving? °¨ 1 oz lean meat, poultry, or fish °¨ ¼ cup cooked dry beans °¨ ½ oz nuts or 1 egg °¨ 1 tbs peanut butter °Milk °· Breastfeeding: 3 cups °· Non-Breastfeeding: 3 cups °· What Counts as a serving? °¨ 1 cup milk. °¨ 8 oz yogurt. °¨ 1 ½ oz cheese. °¨ 2 oz processed cheese. °TIPS FOR THE BREASTFEEDING MOM °· Rapid weight loss is not suggested when you are breastfeeding. By simply breastfeeding, you will be able to lose the weight gained during your pregnancy. Your caregiver can keep track of your weight and tell you if your weight loss is appropriate. °· Be sure to drink fluids. You may notice that you are thirstier than usual. A suggestion is to drink a glass of water or other beverage whenever you breastfeed. °· Avoid alcohol as it can be passed into your breast milk. °· Limit caffeine drinks to no more than 2 to 3 cups per day. °· You may need to keep taking your prenatal vitamin while you are breastfeeding. Talk with your caregiver about taking a vitamin or supplement. °RETURING TO A HEALTHY WEIGHT °· The My Pyramid Plan for Moms will help you return to a healthy weight. It will also provide the nutrients you need. °· You may need to limit "empty" calories. These include: °¨ High fat foods like fried foods, fatty meats, fast food, butter, and mayonnaise. °¨ High   sugar foods like sodas, jelly, candy, and sweets.  Be physically active. Include 30 minutes of exercise or more each day. Choose an activity you like such as walking, swimming, biking, or aerobics. Check with your caregiver before you start to exercise. Document Released: 06/11/2007 Document Revised: 05/27/2011 Document Reviewed: 06/11/2007 [facility] Patient Information 2015 [place], [place]. This information is not intended to replace advice given to you by your health care provider. Make sure you discuss any questions you have with your health care provider. Postpartum Depression and Baby Blues The postpartum period  begins right after the birth of a baby. During this time, there is often a great amount of joy and excitement. It is also a time of many changes in the life of the parents. Regardless of how many times a mother gives birth, each child brings new challenges and dynamics to the family. It is not unusual to have feelings of excitement along with confusing shifts in moods, emotions, and thoughts. All mothers are at risk of developing postpartum depression or the "baby blues." These mood changes can occur right after giving birth, or they may occur many months after giving birth. The baby blues or postpartum depression can be mild or severe. Additionally, postpartum depression can go away rather quickly, or it can be a long-term condition.  CAUSES Raised hormone levels and the rapid drop in those levels are thought to be a main cause of postpartum depression and the baby blues. A number of hormones change during and after pregnancy. Estrogen and progesterone usually decrease right after the delivery of your baby. The levels of thyroid hormone and various cortisol steroids also rapidly drop. Other factors that play a role in these mood changes include major life events and genetics.  RISK FACTORS If you have any of the following risks for the baby blues or postpartum depression, know what symptoms to watch out for during the postpartum period. Risk factors that may increase the likelihood of getting the baby blues or postpartum depression include:  Having a personal or family history of depression.   Having depression while being pregnant.   Having premenstrual mood issues or mood issues related to oral contraceptives.  Having a lot of life stress.   Having marital conflict.   Lacking a social support network.   Having a baby with special needs.   Having health problems, such as diabetes.  SIGNS AND SYMPTOMS Symptoms of baby blues include:  Brief changes in mood, such as going from extreme  happiness to sadness.  Decreased concentration.   Difficulty sleeping.   Crying spells, tearfulness.   Irritability.   Anxiety.  Symptoms of postpartum depression typically begin within the first month after giving birth. These symptoms include:  Difficulty sleeping or excessive sleepiness.   Marked weight loss.   Agitation.   Feelings of worthlessness.   Lack of interest in activity or food.  Postpartum psychosis is a very serious condition and can be dangerous. Fortunately, it is rare. Displaying any of the following symptoms is cause for immediate medical attention. Symptoms of postpartum psychosis include:   Hallucinations and delusions.   Bizarre or disorganized behavior.   Confusion or disorientation.  DIAGNOSIS  A diagnosis is made by an evaluation of your symptoms. There are no medical or lab tests that lead to a diagnosis, but there are various questionnaires that a health care provider may use to identify those with the baby blues, postpartum depression, or psychosis. Often, a screening tool called the [place] Postnatal Depression  Scale is used to diagnose depression in the postpartum period.  °TREATMENT °The baby blues usually goes away on its own in 1-2 weeks. Social support is often all that is needed. You will be encouraged to get adequate sleep and rest. Occasionally, you may be given medicines to help you sleep.  °Postpartum depression requires treatment because it can last several months or longer if it is not treated. Treatment may include individual or group therapy, medicine, or both to address any social, physiological, and psychological factors that may play a role in the depression. Regular exercise, a healthy diet, rest, and social support may also be strongly recommended.  °Postpartum psychosis is more serious and needs treatment right away. Hospitalization is often needed. °HOME CARE INSTRUCTIONS °· Get as much rest as you can. Nap when the baby  sleeps.   °· Exercise regularly. Some women find yoga and walking to be beneficial.   °· Eat a balanced and nourishing diet.   °· Do little things that you enjoy. Have a cup of tea, take a bubble bath, read your favorite magazine, or listen to your favorite music. °· Avoid alcohol.   °· Ask for help with household chores, cooking, grocery shopping, or running errands as needed. Do not try to do everything.   °· Talk to people close to you about how you are feeling. Get support from your partner, family members, friends, or other new moms. °· Try to stay positive in how you think. Think about the things you are grateful for.   °· Do not spend a lot of time alone.   °· Only take over-the-counter or prescription medicine as directed by your health care provider. °· Keep all your postpartum appointments.   °· Let your health care provider know if you have any concerns.   °SEEK MEDICAL CARE IF: °You are having a reaction to or problems with your medicine. °SEEK IMMEDIATE MEDICAL CARE IF: °· You have suicidal feelings.   °· You think you may harm the baby or someone else. °MAKE SURE YOU: °· Understand these instructions. °· Will watch your condition. °· Will get help right away if you are not doing well or get worse. °Document Released: 12/07/2003 Document Revised: 03/09/2013 Document Reviewed: 12/14/2012 °[employer]® Patient Information ©2015 [employer]. This information is not intended to replace advice given to you by your health care provider. Make sure you discuss any questions you have with your health care provider. °Breastfeeding and Mastitis °Mastitis is inflammation of the breast tissue. It can occur in women who are breastfeeding. This can make breastfeeding painful. Mastitis will sometimes go away on its own. Your health care provider will help determine if treatment is needed. °CAUSES °Mastitis is often associated with a blocked milk (lactiferous) duct. This can happen when too much milk builds up in the  breast. Causes of excess milk in the breast can include: °· Poor latch-on. If your baby is not latched onto the breast properly, she or he may not empty your breast completely while breastfeeding. °· Allowing too much time to pass between feedings. °· Wearing a bra or other clothing that is too tight. This puts extra pressure on the lactiferous ducts so milk does not flow through them as it should. °Mastitis can also be caused by a bacterial infection. Bacteria may enter the breast tissue through cuts or openings in the skin. In women who are breastfeeding, this may occur because of cracked or irritated skin. Cracks in the skin are often caused when your baby does not latch on properly to the   breast. °SIGNS AND SYMPTOMS °· Swelling, redness, tenderness, and pain in an area of the breast. °· Swelling of the glands under the arm on the same side. °· Fever may or may not accompany mastitis. °If an infection is allowed to progress, a collection of pus (abscess) may develop. °DIAGNOSIS  °Your health care provider can usually diagnose mastitis based on your symptoms and a physical exam. Tests may be done to help confirm the diagnosis. These may include: °· Removal of pus from the breast by applying pressure to the area. This pus can be examined in the lab to determine which bacteria are present. If an abscess has developed, the fluid in the abscess can be removed with a needle. This can also be used to confirm the diagnosis and determine the bacteria present. In most cases, pus will not be present. °· Blood tests to determine if your body is fighting a bacterial infection. °· Mammogram or ultrasound tests to rule out other problems or diseases. °TREATMENT  °Mastitis that occurs with breastfeeding will sometimes go away on its own. Your health care provider may choose to wait 24 hours after first seeing you to decide whether a prescription medicine is needed. If your symptoms are worse after 24 hours, your health care  provider will likely prescribe an antibiotic medicine to treat the mastitis. He or she will determine which bacteria are most likely causing the infection and will then select an appropriate antibiotic medicine. This is sometimes changed based on the results of tests performed to identify the bacteria, or if there is no response to the antibiotic medicine selected. Antibiotic medicines are usually given by mouth. You may also be given medicine for pain. °HOME CARE INSTRUCTIONS °· Only take over-the-counter or prescription medicines for pain, fever, or discomfort as directed by your health care provider. °· If your health care provider prescribed an antibiotic medicine, take the medicine as directed. Make sure you finish it even if you start to feel better. °· Do not wear a tight or underwire bra. Wear a soft, supportive bra. °· Increase your fluid intake, especially if you have a fever. °· Continue to empty the breast. Your health care provider can tell you whether this milk is safe for your infant or needs to be thrown out. You may be told to stop nursing until your health care provider thinks it is safe for your baby. Use a breast pump if you are advised to stop nursing. °· Keep your nipples clean and dry. °· Empty the first breast completely before going to the other breast. If your baby is not emptying your breasts completely for some reason, use a breast pump to empty your breasts. °· If you go back to work, pump your breasts while at work to stay in time with your nursing schedule. °· Avoid allowing your breasts to become overly filled with milk (engorged). °SEEK MEDICAL CARE IF: °· You have pus-like discharge from the breast. °· Your symptoms do not improve with the treatment prescribed by your health care provider within 2 days. °SEEK IMMEDIATE MEDICAL CARE IF: °· Your pain and swelling are getting worse. °· You have pain that is not controlled with medicine. °· You have a red line extending from the breast  toward your armpit. °· You have a fever or persistent symptoms for more than 2-3 days. °· You have a fever and your symptoms suddenly get worse. °MAKE SURE YOU:  °· Understand these instructions. °· Will watch your condition. °· Will   get help right away if you are not doing well or get worse. Document Released: 06/29/2004 Document Revised: 03/09/2013 Document Reviewed: 10/08/2012 [facility] Patient Information 2015 [place], [place]. This information is not intended to replace advice given to you by your health care provider. Make sure you discuss any questions you have with your health care provider. Breastfeeding Deciding to breastfeed is one of the best choices you can make for you and your baby. A change in hormones during pregnancy causes your breast tissue to grow and increases the number and size of your milk ducts. These hormones also allow proteins, sugars, and fats from your blood supply to make breast milk in your milk-producing glands. Hormones prevent breast milk from being released before your baby is born as well as prompt milk flow after birth. Once breastfeeding has begun, thoughts of your baby, as well as his or her sucking or crying, can stimulate the release of milk from your milk-producing glands.  BENEFITS OF BREASTFEEDING For Your Baby  Your first milk (colostrum) helps your baby's digestive system function better.   There are antibodies in your milk that help your baby fight off infections.   Your baby has a lower incidence of asthma, allergies, and sudden infant death syndrome.   The nutrients in breast milk are better for your baby than infant formulas and are designed uniquely for your baby's needs.   Breast milk improves your baby's brain development.   Your baby is less likely to develop other conditions, such as childhood obesity, asthma, or type 2 diabetes mellitus.  For You   Breastfeeding helps to create a very special bond between you and your baby.    Breastfeeding is convenient. Breast milk is always available at the correct temperature and costs nothing.   Breastfeeding helps to burn calories and helps you lose the weight gained during pregnancy.   Breastfeeding makes your uterus contract to its prepregnancy size faster and slows bleeding (lochia) after you give birth.   Breastfeeding helps to lower your risk of developing type 2 diabetes mellitus, osteoporosis, and breast or ovarian cancer later in life. SIGNS THAT YOUR BABY IS HUNGRY Early Signs of Hunger  Increased alertness or activity.  Stretching.  Movement of the head from side to side.  Movement of the head and opening of the mouth when the corner of the mouth or cheek is stroked (rooting).  Increased sucking sounds, smacking lips, cooing, sighing, or squeaking.  Hand-to-mouth movements.  Increased sucking of fingers or hands. Late Signs of Hunger  Fussing.  Intermittent crying. Extreme Signs of Hunger Signs of extreme hunger will require calming and consoling before your baby will be able to breastfeed successfully. Do not wait for the following signs of extreme hunger to occur before you initiate breastfeeding:   Restlessness.  A loud, strong cry.   Screaming. BREASTFEEDING BASICS Breastfeeding Initiation  Find a comfortable place to sit or lie down, with your neck and back well supported.  Place a pillow or rolled up blanket under your baby to bring him or her to the level of your breast (if you are seated). Nursing pillows are specially designed to help support your arms and your baby while you breastfeed.  Make sure that your baby's abdomen is facing your abdomen.   Gently massage your breast. With your fingertips, massage from your chest wall toward your nipple in a circular motion. This encourages milk flow. You may need to continue this action during the feeding if your milk flows  slowly.  Support your breast with 4 fingers underneath and  your thumb above your nipple. Make sure your fingers are well away from your nipple and your baby's mouth.   Stroke your baby's lips gently with your finger or nipple.   When your baby's mouth is open wide enough, quickly bring your baby to your breast, placing your entire nipple and as much of the colored area around your nipple (areola) as possible into your baby's mouth.   More areola should be visible above your baby's upper lip than below the lower lip.   Your baby's tongue should be between his or her lower gum and your breast.   Ensure that your baby's mouth is correctly positioned around your nipple (latched). Your baby's lips should create a seal on your breast and be turned out (everted).  It is common for your baby to suck about 2-3 minutes in order to start the flow of breast milk. Latching Teaching your baby how to latch on to your breast properly is very important. An improper latch can cause nipple pain and decreased milk supply for you and poor weight gain in your baby. Also, if your baby is not latched onto your nipple properly, he or she may swallow some air during feeding. This can make your baby fussy. Burping your baby when you switch breasts during the feeding can help to get rid of the air. However, teaching your baby to latch on properly is still the best way to prevent fussiness from swallowing air while breastfeeding. Signs that your baby has successfully latched on to your nipple:    Silent tugging or silent sucking, without causing you pain.   Swallowing heard between every 3-4 sucks.    Muscle movement above and in front of his or her ears while sucking.  Signs that your baby has not successfully latched on to nipple:   Sucking sounds or smacking sounds from your baby while breastfeeding.  Nipple pain. If you think your baby has not latched on correctly, slip your finger into the corner of your baby's mouth to break the suction and place it between  your baby's gums. Attempt breastfeeding initiation again. Signs of Successful Breastfeeding Signs from your baby:   A gradual decrease in the number of sucks or complete cessation of sucking.   Falling asleep.   Relaxation of his or her body.   Retention of a small amount of milk in his or her mouth.   Letting go of your breast by himself or herself. Signs from you:  Breasts that have increased in firmness, weight, and size 1-3 hours after feeding.   Breasts that are softer immediately after breastfeeding.  Increased milk volume, as well as a change in milk consistency and color by the fifth day of breastfeeding.   Nipples that are not sore, cracked, or bleeding. Signs That Your [employer] is Getting Enough Milk  Wetting at least 3 diapers in a 24-hour period. The urine should be clear and pale yellow by age 81 days.  At least 3 stools in a 24-hour period by age 81 days. The stool should be soft and yellow.  At least 3 stools in a 24-hour period by age 37 days. The stool should be seedy and yellow.  No loss of weight greater than 10% of birth weight during the first 23 days of age.  Average weight gain of 4-7 ounces (113-198 g) per week after age 50 days.  Consistent daily weight gain by age 31  days, without weight loss after the age of 2 weeks. After a feeding, your baby may spit up a small amount. This is common. BREASTFEEDING FREQUENCY AND DURATION Frequent feeding will help you make more milk and can prevent sore nipples and breast engorgement. Breastfeed when you feel the need to reduce the fullness of your breasts or when your baby shows signs of hunger. This is called "breastfeeding on demand." Avoid introducing a pacifier to your baby while you are working to establish breastfeeding (the first 4-6 weeks after your baby is born). After this time you may choose to use a pacifier. Research has shown that pacifier use during the first year of a baby's life decreases the risk of  sudden infant death syndrome (SIDS). Allow your baby to feed on each breast as long as he or she wants. Breastfeed until your baby is finished feeding. When your baby unlatches or falls asleep while feeding from the first breast, offer the second breast. Because newborns are often sleepy in the first few weeks of life, you may need to awaken your baby to get him or her to feed. Breastfeeding times will vary from baby to baby. However, the following rules can serve as a guide to help you ensure that your baby is properly fed:  Newborns (babies 79 weeks of age or younger) may breastfeed every 1-3 hours.  Newborns should not go longer than 3 hours during the day or 5 hours during the night without breastfeeding.  You should breastfeed your baby a minimum of 8 times in a 24-hour period until you begin to introduce solid foods to your baby at around 27 months of age. BREAST MILK PUMPING Pumping and storing breast milk allows you to ensure that your baby is exclusively fed your breast milk, even at times when you are unable to breastfeed. This is especially important if you are going back to work while you are still breastfeeding or when you are not able to be present during feedings. Your lactation consultant can give you guidelines on how long it is safe to store breast milk.  A breast pump is a machine that allows you to pump milk from your breast into a sterile bottle. The pumped breast milk can then be stored in a refrigerator or freezer. Some breast pumps are operated by hand, while others use electricity. Ask your lactation consultant which type will work best for you. Breast pumps can be purchased, but some hospitals and breastfeeding support groups lease breast pumps on a monthly basis. A lactation consultant can teach you how to hand express breast milk, if you prefer not to use a pump.  CARING FOR YOUR BREASTS WHILE YOU BREASTFEED Nipples can become dry, cracked, and sore while breastfeeding. The  following recommendations can help keep your breasts moisturized and healthy:  Avoid using soap on your nipples.   Wear a supportive bra. Although not required, special nursing bras and tank tops are designed to allow access to your breasts for breastfeeding without taking off your entire bra or top. Avoid wearing underwire-style bras or extremely tight bras.  Air dry your nipples for 3-79minutes after each feeding.   Use only cotton bra pads to absorb leaked breast milk. Leaking of breast milk between feedings is normal.   Use lanolin on your nipples after breastfeeding. Lanolin helps to maintain your skin's normal moisture barrier. If you use pure lanolin, you do not need to wash it off before feeding your baby again. Pure lanolin is not  toxic to your baby. You may also hand express a few drops of breast milk and gently massage that milk into your nipples and allow the milk to air dry. In the first few weeks after giving birth, some women experience extremely full breasts (engorgement). Engorgement can make your breasts feel heavy, warm, and tender to the touch. Engorgement peaks within 3-5 days after you give birth. The following recommendations can help ease engorgement:  Completely empty your breasts while breastfeeding or pumping. You may want to start by applying warm, moist heat (in the shower or with warm water-soaked hand towels) just before feeding or pumping. This increases circulation and helps the milk flow. If your baby does not completely empty your breasts while breastfeeding, pump any extra milk after he or she is finished.  Wear a snug bra (nursing or regular) or tank top for 1-2 days to signal your body to slightly decrease milk production.  Apply ice packs to your breasts, unless this is too uncomfortable for you.  Make sure that your baby is latched on and positioned properly while breastfeeding. If engorgement persists after 48 hours of following these recommendations,  contact your health care provider or a Science writer. OVERALL HEALTH CARE RECOMMENDATIONS WHILE BREASTFEEDING  Eat healthy foods. Alternate between meals and snacks, eating 3 of each per day. Because what you eat affects your breast milk, some of the foods may make your baby more irritable than usual. Avoid eating these foods if you are sure that they are negatively affecting your baby.  Drink milk, fruit juice, and water to satisfy your thirst (about 10 glasses a day).   Rest often, relax, and continue to take your prenatal vitamins to prevent fatigue, stress, and anemia.  Continue breast self-awareness checks.  Avoid chewing and smoking tobacco.  Avoid alcohol and drug use. Some medicines that may be harmful to your baby can pass through breast milk. It is important to ask your health care provider before taking any medicine, including all over-the-counter and prescription medicine as well as vitamin and herbal supplements. It is possible to become pregnant while breastfeeding. If birth control is desired, ask your health care provider about options that will be safe for your baby. SEEK MEDICAL CARE IF:   You feel like you want to stop breastfeeding or have become frustrated with breastfeeding.  You have painful breasts or nipples.  Your nipples are cracked or bleeding.  Your breasts are red, tender, or warm.  You have a swollen area on either breast.  You have a fever or chills.  You have nausea or vomiting.  You have drainage other than breast milk from your nipples.  Your breasts do not become full before feedings by the fifth day after you give birth.  You feel sad and depressed.  Your baby is too sleepy to eat well.  Your baby is having trouble sleeping.   Your baby is wetting less than 3 diapers in a 24-hour period.  Your baby has less than 3 stools in a 24-hour period.  Your baby's skin or the white part of his or her eyes becomes yellow.   Your baby  is not gaining weight by 38 days of age. SEEK IMMEDIATE MEDICAL CARE IF:   Your baby is overly tired (lethargic) and does not want to wake up and feed.  Your baby develops an unexplained fever. Document Released: 03/04/2005 Document Revised: 03/09/2013 Document Reviewed: 08/26/2012 [facility] Patient Information 2015 [place], [place]. This information is not intended to  replace advice given to you by your health care provider. Make sure you discuss any questions you have with your health care provider. ° °

## 2014-07-27 NOTE — Lactation Note (Signed)
This note was copied from the chart of [place]. Lactation Consultation Note  Follow up visit made prior to discharge.  Mom is having difficulty with latch on right breast.  Mom massaging breast and using hand expression prior to latch attempt. Small drop of colostrum visible. Parents shown how to compress areola to assist with easier and deeper latch.  Baby latched easily with good compression and nursed actively.  Few swallows noted.  Discharge instructions given including engorgement treatment.  Outpatient lactation services and support encouraged.  Patient Name: Boy Kierstynn Babich YELYH'T Date: 07/27/2014 Reason for consult: Follow-up assessment   Maternal Data    Feeding Feeding Type: Breast Fed Length of feed: 20 min  LATCH Score/Interventions Latch: Grasps breast easily, tongue down, lips flanged, rhythmical sucking. Intervention(s): Skin to skin;Teach feeding cues;Waking techniques Intervention(s): Breast compression;Assist with latch;Adjust position  Audible Swallowing: A few with stimulation Intervention(s): Hand expression;Skin to skin Intervention(s): Skin to skin;Hand expression;Alternate breast massage  Type of Nipple: Everted at rest and after stimulation Intervention(s): Reverse pressure (will get another nipple shield/pt. misplaced)  Comfort (Breast/Nipple): Soft / non-tender  Problem noted: Mild/Moderate discomfort Interventions (Mild/moderate discomfort): Comfort gels;Hand massage;Hand expression  Hold (Positioning): Assistance needed to correctly position infant at breast and maintain latch. Intervention(s): Breastfeeding basics reviewed;Support Pillows;Position options;Skin to skin  LATCH Score: 8  Lactation Tools Discussed/Used     Consult Status Consult Status: Complete    Ave Filter 07/27/2014, 9:45 AM

## 2014-07-27 NOTE — Progress Notes (Signed)
PPD #2 - SVD  Subjective:   Reports feeling ok, bottom sore Tolerating po/ No nausea or vomiting Bleeding is light Pain controlled with Motrin and Percocet Up ad lib / ambulatory / voiding without problems Newborn: breastfeeding  / Circumcision: done   Objective:   VS:  VS:  Filed Vitals:   07/25/14 1812 07/26/14 0524 07/26/14 1825 07/27/14 0515  BP: 99/46 107/53 115/61 111/53  Pulse: 78 78 93 91  Temp: 98.4 F (36.9 C) 97.9 F (36.6 C) 98.4 F (36.9 C) 98.4 F (36.9 C)  TempSrc: Oral Oral Oral Oral  Resp: 18 18 18 18   Height:      Weight:      SpO2:        LABS:   Recent Labs  07/25/14 0214 07/25/14 0822  WBC 22.8* 18.6*  HGB 11.6* 10.0*  PLT 219 198   Blood type: A POS (05/08 1640) Rubella: Immune (10/12 0000)   I&O: Intake/Output    None     Physical Exam: Alert and oriented x3 Abdomen: soft, non-tender, non-distended  Fundus: firm, non-tender, U-2 Perineum: Well approximated, no significant erythema, edema, or drainage; healing well. Ice pk in place. Lochia: small Extremities: 1+ BLE edema, no calf pain or tenderness; TEDs on    Assessment:  PPD #2 / G1P1001 / S/P:spontaneous vaginal, 4th degree laceration PPH-stable  ABL anemia  Doing well    Plan: Increase water intake, avoid cafeinated beverages, increase dietary fiber (fruits and veggies) Continue ambulation prn after d/c home Continue routine post partum orders Anticipate D/C home today Bowel regimen x 8 wks   Laury Deep, M MSN, CNM 07/27/2014, 8:46 AM

## 2014-07-28 ENCOUNTER — Inpatient Hospital Stay (HOSPITAL_COMMUNITY): Payer: 59

## 2015-03-19 HISTORY — PX: BREAST LUMPECTOMY: SHX2

## 2015-05-01 ENCOUNTER — Encounter: Payer: Self-pay | Admitting: Internal Medicine

## 2015-05-01 ENCOUNTER — Ambulatory Visit (INDEPENDENT_AMBULATORY_CARE_PROVIDER_SITE_OTHER): Payer: 59 | Admitting: Internal Medicine

## 2015-05-01 VITALS — BP 112/76 | HR 81 | Temp 97.9°F | Ht 66.75 in | Wt 216.5 lb

## 2015-05-01 DIAGNOSIS — Z23 Encounter for immunization: Secondary | ICD-10-CM | POA: Diagnosis not present

## 2015-05-01 DIAGNOSIS — B009 Herpesviral infection, unspecified: Secondary | ICD-10-CM

## 2015-05-01 DIAGNOSIS — D1720 Benign lipomatous neoplasm of skin and subcutaneous tissue of unspecified limb: Secondary | ICD-10-CM

## 2015-05-01 NOTE — Progress Notes (Signed)
HPI  Pt presents to the clinic today to establish care and for management of the conditions listed below. She has not had a PCP in many years.  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 She does not take anything daily for this.  She c/o a lump under her right arm. She noticed this 6 months ago. It is not painful. It has not drained. She has not tried anything to make it go away.  Flu: 2015 Tetanus: 01/2014 Pap Smear: 09/2014, normal Dentist: as needed  Past Medical History  Diagnosis Date  . HSV infection   . Chicken pox     Current Outpatient Prescriptions  Medication Sig Dispense Refill  . Prenatal Vit-Fe Fumarate-FA (PRENATAL MULTIVITAMIN) TABS tablet Take 1 tablet by mouth daily.     No current facility-administered medications for this visit.    Allergies  Allergen Reactions  . Peanuts [Peanut Oil] Hives    Family History  Problem Relation Age of Onset  . Arthritis Mother   . Stroke Mother   . Stroke Maternal Aunt   . Stroke Maternal Uncle   . Breast cancer Maternal Grandmother   . Lung cancer Maternal Grandfather   . Stroke Paternal Grandmother   . Lung cancer Paternal Grandfather     Social History   Social History  . Marital Status: Married    Spouse Name: N/A  . Number of Children: N/A  . Years of Education: N/A   Occupational History  . Not on file.   Social History Main Topics  . Smoking status: Never Smoker   . Smokeless tobacco: Never Used  . Alcohol Use: 0.0 oz/week    0 Standard drinks or equivalent per week     Comment: rare  . Drug Use: No  . Sexual Activity: No   Other Topics Concern  . Not on file   Social History Narrative    ROS:  Constitutional: Denies fever, malaise, fatigue, headache or abrupt weight changes.  Respiratory: Denies difficulty breathing, shortness of breath, cough or sputum production.   Cardiovascular: Denies chest pain, chest tightness, palpitations or swelling in the hands or feet.  Skin:  Pt reports a lump under her right arm. Denies redness, rashes, lesions or ulcercations.  Neurological: Denies dizziness, difficulty with memory, difficulty with speech or problems with balance and coordination.  Psych: Denies anxiety, depression, SI/HI.  No other specific complaints in a complete review of systems (except as listed in HPI above).  PE:  BP 112/76 mmHg  Pulse 81  Temp(Src) 97.9 F (36.6 C) (Oral)  Ht 5' 6.75" (1.695 m)  Wt 216 lb 8 oz (98.204 kg)  BMI 34.18 kg/m2  SpO2 98%  LMP 04/26/2015 Wt Readings from Last 3 Encounters:  05/01/15 216 lb 8 oz (98.204 kg)  07/24/14 244 lb (110.678 kg)    General: Appears her stated age, obese in NAD. Skin: Buildup of subcutaneous fat noted under right armpit, no discrete mass noted. Cardiovascular: Normal rate and rhythm. S1,S2 noted.  No murmur, rubs or gallops noted. Pulmonary/Chest: Normal effort and positive vesicular breath sounds. No respiratory distress. No wheezes, rales or ronchi noted.  Neurological: Alert and oriented.  Psychiatric: Mood and affect normal. Behavior is normal. Judgment and thought content normal.   CBC    Component Value Date/Time   WBC 18.6* 07/25/2014 0822   RBC 3.71* 07/25/2014 0822   HGB 10.0* 07/25/2014 0822   HCT 30.3* 07/25/2014 0822   PLT 198 07/25/2014 0822   MCV  81.7 07/25/2014 0822   MCH 27.0 07/25/2014 0822   MCHC 33.0 07/25/2014 0822   RDW 15.0 07/25/2014 0822   LYMPHSABS 2.9 07/25/2014 0822   MONOABS 1.1* 07/25/2014 0822   EOSABS 0.1 07/25/2014 0822   BASOSABS 0.0 07/25/2014 0822     Assessment and Plan:  Lipoma:  Advised her this is benign No intervention needed Will continue to monitor  HSV 2 infection:  Rare flares She does not want suppressive therapy at this time  Need for influenza vaccine:  Flu shot today  Make an appt for your annual exam

## 2015-05-01 NOTE — Patient Instructions (Signed)

## 2015-05-01 NOTE — Progress Notes (Signed)
Pre visit review using our clinic review tool, if applicable. No additional management support is needed unless otherwise documented below in the visit note. 

## 2015-08-08 ENCOUNTER — Encounter: Payer: 59 | Admitting: Internal Medicine

## 2015-12-19 ENCOUNTER — Other Ambulatory Visit: Payer: Self-pay | Admitting: Radiology

## 2015-12-27 ENCOUNTER — Encounter: Payer: Self-pay | Admitting: Genetic Counselor

## 2015-12-28 ENCOUNTER — Other Ambulatory Visit: Payer: Self-pay | Admitting: General Surgery

## 2015-12-28 DIAGNOSIS — C50412 Malignant neoplasm of upper-outer quadrant of left female breast: Secondary | ICD-10-CM

## 2015-12-29 ENCOUNTER — Telehealth: Payer: Self-pay | Admitting: *Deleted

## 2015-12-29 ENCOUNTER — Other Ambulatory Visit: Payer: Self-pay | Admitting: General Surgery

## 2015-12-29 ENCOUNTER — Encounter: Payer: Self-pay | Admitting: Hematology and Oncology

## 2015-12-29 ENCOUNTER — Telehealth: Payer: Self-pay | Admitting: Hematology and Oncology

## 2015-12-29 ENCOUNTER — Encounter: Payer: Self-pay | Admitting: Genetic Counselor

## 2015-12-29 NOTE — Telephone Encounter (Signed)
Discussed prognostic panel with pt and gave education. Informed pt she will discuss plan of care with Dr. Lindi Adie on 01/03/16 at 3:30pm. Gave navigation resources, contact information, directions and instructions. Encourage pt to call with questions or needs. Received verbal understanding.

## 2015-12-29 NOTE — Telephone Encounter (Signed)
Genetics appt scheduled for 10/19@11am  w/Kayla and onc appt [place] 10/18 @ 330pm. Pt agreed. Demographics verified. Letter mailed to the patient.

## 2016-01-03 ENCOUNTER — Ambulatory Visit (HOSPITAL_BASED_OUTPATIENT_CLINIC_OR_DEPARTMENT_OTHER): Payer: 59 | Admitting: Hematology and Oncology

## 2016-01-03 ENCOUNTER — Encounter: Payer: Self-pay | Admitting: *Deleted

## 2016-01-03 DIAGNOSIS — C50412 Malignant neoplasm of upper-outer quadrant of left female breast: Secondary | ICD-10-CM | POA: Diagnosis not present

## 2016-01-03 DIAGNOSIS — Z171 Estrogen receptor negative status [ER-]: Principal | ICD-10-CM

## 2016-01-03 NOTE — Progress Notes (Signed)
[facility] NOTE  Patient Care Team: Jearld Fenton, NP as PCP - General (Internal Medicine) Servando Salina, MD as Consulting Physician (Obstetrics and Gynecology)  CHIEF COMPLAINTS/PURPOSE OF CONSULTATION:  Newly diagnosed breast cancer  HISTORY OF PRESENTING ILLNESS:  Brianna James 35 y.o. female is here because of recent diagnosis of Left breast cancer. She was playing with her son, when she hurt the breast and felt a lesion. 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  I reviewed her records extensively and collaborated the history with the patient.  SUMMARY OF ONCOLOGIC HISTORY:   Breast cancer of upper-outer quadrant of left female breast ([place])   12/19/2015 Initial Diagnosis    Left breast biopsy: IDC with DCI S, grade 3, ER 0%, PR 0%, KIC system 40%, her 2 positive ratio 6.15, copy number 16.5; palpable lump left breast 1.6 cm irregular oval mass, T1cN0 stage I a clinical stage      MEDICAL HISTORY:  Past Medical History:  Diagnosis Date  . Chicken pox   . HSV infection     SURGICAL HISTORY: Past Surgical History:  Procedure Laterality Date  . WISDOM TOOTH EXTRACTION      SOCIAL HISTORY: Social History   Social History  . Marital status: Married    Spouse name: N/A  . Number of children: N/A  . Years of education: N/A   Occupational History  . Not on file.   Social History Main Topics  . Smoking status: Never Smoker  . Smokeless tobacco: Never Used  . Alcohol use 0.0 oz/week     Comment: rare  . Drug use: No  . Sexual activity: Yes    Birth control/ protection: None   Other Topics Concern  . Not on file   Social History Narrative  . No narrative on file    FAMILY HISTORY: Family History  Problem Relation Age of Onset  . Arthritis Mother   . Stroke Mother   . Stroke Maternal Aunt   . Stroke Maternal Uncle   . Breast cancer Maternal Grandmother   . Lung cancer Maternal Grandfather   .  Stroke Paternal Grandmother   . Lung cancer Paternal Grandfather     ALLERGIES:  is allergic to peanuts [peanut oil].  MEDICATIONS:  Current Outpatient Prescriptions  Medication Sig Dispense Refill  . Prenatal Vit-Fe Fumarate-FA (PRENATAL MULTIVITAMIN) TABS tablet Take 1 tablet by mouth daily.     No current facility-administered medications for this visit.     REVIEW OF SYSTEMS:   Constitutional: Denies fevers, chills or abnormal night sweats Eyes: Denies blurriness of vision, double vision or watery eyes Ears, nose, mouth, throat, and face: Denies mucositis or sore throat Respiratory: Denies cough, dyspnea or wheezes Cardiovascular: Denies palpitation, chest discomfort or lower extremity swelling Gastrointestinal:  Denies nausea, heartburn or change in bowel habits Skin: Denies abnormal skin rashes Lymphatics: Denies new lymphadenopathy or easy bruising Neurological:Denies numbness, tingling or new weaknesses Behavioral/Psych: Mood is stable, no new changes  Breast:Rt lump All other systems were reviewed with the patient and are negative.  PHYSICAL EXAMINATION: ECOG PERFORMANCE STATUS: 0  Vitals:   01/03/16 1543  BP: 114/68  Pulse: 77  Resp: 18  Temp: 98.3 F (36.8 C)   Filed Weights   01/03/16 1543  Weight: 217 lb 4.8 oz (98.6 kg)    GENERAL:alert, no distress and comfortable SKIN: skin color, texture, turgor are normal, no rashes or significant lesions EYES:  normal, conjunctiva are pink and non-injected, sclera clear OROPHARYNX:no exudate, no erythema and lips, buccal mucosa, and tongue normal  NECK: supple, thyroid normal size, non-tender, without nodularity LYMPH:  no palpable lymphadenopathy in the cervical, axillary or inguinal LUNGS: clear to auscultation and percussion with normal breathing effort HEART: regular rate & rhythm and no murmurs and no lower extremity edema ABDOMEN:abdomen soft, non-tender and normal bowel sounds Musculoskeletal:no cyanosis  of digits and no clubbing  PSYCH: alert & oriented x 3 with fluent speech NEURO: no focal motor/sensory deficits BREAST:Retro areolar lesion measuring   LABORATORY DATA:  I have reviewed the data as listed Lab Results  Component Value Date   WBC 18.6 (H) 07/25/2014   HGB 10.0 (L) 07/25/2014   HCT 30.3 (L) 07/25/2014   MCV 81.7 07/25/2014   PLT 198 07/25/2014   No results found for: NA, K, CL, CO2  RADIOGRAPHIC STUDIES: I have personally reviewed the radiological reports and agreed with the findings in the report.  ASSESSMENT AND PLAN:  Breast cancer of upper-outer quadrant of left female breast ([place]) Left breast biopsy: IDC with DCI S, grade 3, ER 0%, PR 0%, KIC system 40%, her 2 positive ratio 6.15, copy number 16.5; palpable lump left breast 1.6 cm irregular oval mass, T1cN0 stage I a clinical stage  Pathology and radiology counseling: Discussed with the patient, the details of pathology including the type of breast cancer,the clinical staging, the significance of ER, PR and HER-2/neu receptors and the implications for treatment. After reviewing the pathology in detail, we proceeded to discuss the different treatment options between surgery, radiation, chemotherapy, antiestrogen therapies.  Recommendation: 1. Genetic testing 2. Breast conserving surgery with lumpectomy (assuming she does not have a mutation that might require bilateral mastectomies) 3. Adjuvant chemotherapy with TCH X 6 cycles followed by Herceptin maintenance for one year 4. Followed by adjuvant radiation  Chemotherapy Counseling: I discussed the risks and benefits of chemotherapy including the risks of nausea/ vomiting, risk of infection from low WBC count, fatigue due to chemo or anemia, bruising or bleeding due to low platelets, mouth sores, loss/ change in taste and decreased appetite. Liver and kidney function will be monitored through out chemotherapy as abnormalities in liver and kidney function may be a  side effect of treatment. Cardiac dysfunction due to Herceptin was discussed in detail. Risk of permanent bone marrow dysfunction and leukemia due to chemo were also discussed.      All questions were answered. The patient knows to call the clinic with any problems, questions or concerns.    Rulon Eisenmenger, MD 01/03/16

## 2016-01-03 NOTE — Assessment & Plan Note (Signed)
Left breast biopsy: IDC with DCI S, grade 3, ER 0%, PR 0%, KIC system 40%, her 2 positive ratio 6.15, copy number 16.5; palpable lump left breast 1.6 cm irregular oval mass, T1cN0 stage I a clinical stage  Pathology and radiology counseling: Discussed with the patient, the details of pathology including the type of breast cancer,the clinical staging, the significance of ER, PR and HER-2/neu receptors and the implications for treatment. After reviewing the pathology in detail, we proceeded to discuss the different treatment options between surgery, radiation, chemotherapy, antiestrogen therapies.  Recommendation: 1. Genetic testing 2. Breast conserving surgery with lumpectomy (assuming she does not have a mutation that might require bilateral mastectomies) 3. Adjuvant chemotherapy with TCH X 6 cycles followed by Herceptin maintenance for one year 4. Followed by adjuvant radiation  Chemotherapy Counseling: I discussed the risks and benefits of chemotherapy including the risks of nausea/ vomiting, risk of infection from low WBC count, fatigue due to chemo or anemia, bruising or bleeding due to low platelets, mouth sores, loss/ change in taste and decreased appetite. Liver and kidney function will be monitored through out chemotherapy as abnormalities in liver and kidney function may be a side effect of treatment. Cardiac dysfunction due to Herceptin was discussed in detail. Risk of permanent bone marrow dysfunction and leukemia due to chemo were also discussed.

## 2016-01-04 ENCOUNTER — Encounter: Payer: Self-pay | Admitting: Genetic Counselor

## 2016-01-04 ENCOUNTER — Other Ambulatory Visit: Payer: 59

## 2016-01-04 ENCOUNTER — Telehealth: Payer: Self-pay

## 2016-01-04 ENCOUNTER — Ambulatory Visit (HOSPITAL_BASED_OUTPATIENT_CLINIC_OR_DEPARTMENT_OTHER): Payer: 59 | Admitting: Genetic Counselor

## 2016-01-04 DIAGNOSIS — Z315 Encounter for genetic counseling: Secondary | ICD-10-CM | POA: Diagnosis not present

## 2016-01-04 DIAGNOSIS — Z803 Family history of malignant neoplasm of breast: Secondary | ICD-10-CM | POA: Diagnosis not present

## 2016-01-04 DIAGNOSIS — Z8049 Family history of malignant neoplasm of other genital organs: Secondary | ICD-10-CM

## 2016-01-04 DIAGNOSIS — C50412 Malignant neoplasm of upper-outer quadrant of left female breast: Secondary | ICD-10-CM | POA: Diagnosis not present

## 2016-01-04 DIAGNOSIS — Z171 Estrogen receptor negative status [ER-]: Principal | ICD-10-CM

## 2016-01-04 NOTE — Progress Notes (Signed)
REFERRING PROVIDER: Nicholas Lose, MD  PRIMARY PROVIDER:  Webb Silversmith, NP  PRIMARY REASON FOR VISIT:  1. Malignant neoplasm of upper-outer quadrant of left breast in female, estrogen receptor negative ([place])   2. Family history of breast cancer in female   3. Family history of uterine cancer      HISTORY OF PRESENT ILLNESS:   Brianna James, a 35 y.o. female, was seen for a Brianna James consultation at the request of Dr. Lindi Adie due to a personal history of breast cancer at 31 and family history of breast and other cancer.  Brianna James presents to clinic today with her husband to discuss the possibility of a hereditary predisposition to cancer, genetic testing, and to further clarify her future cancer risks, as well as potential cancer risks for family members.   In October 2017, at the age of 28, Brianna James was diagnosed with invasive ductal carcinoma with DCIS of the left breast.  Hormone receptor status was ER/PR-, Her2+. Genetic testing will help inform surgical and treatment decisions.  Surgery is scheduled for January 17, 2016.    CANCER HISTORY:    Breast cancer of upper-outer quadrant of left female breast ([place])   12/19/2015 Initial Diagnosis    Left breast biopsy: IDC with DCI S, grade 3, ER 0%, PR 0%, KIC system 40%, her 2 positive ratio 6.15, copy number 16.5; palpable lump left breast 1.6 cm irregular oval mass, T1cN0 stage I a clinical stage        HORMONAL RISK FACTORS:  Menarche was at age 45.  First live birth at age 37.  OCP use for approximately 2 months.  Ovaries intact: yes.  Hysterectomy: no.  Menopausal status: premenopausal.  HRT use: 0 years; history of one round of IVF. Colonoscopy: no; not examined. Mammogram within the last year: this was her first mammogram. Number of breast biopsies: 1. Up to date with pelvic exams:  yes. Any excessive radiation exposure/other exposures in the past:  Maybe some secondhand smoke exposure, but not  much  Past Medical History:  Diagnosis Date  . Chicken pox   . HSV infection     Past Surgical History:  Procedure Laterality Date  . WISDOM TOOTH EXTRACTION      Social History   Social History  . Marital status: Married    Spouse name: N/A  . Number of children: N/A  . Years of education: N/A   Social History Main Topics  . Smoking status: Never Smoker  . Smokeless tobacco: Never Used     Comment: "tried smoking" but was not a smoker  . Alcohol use 0.0 oz/week     Comment: rare - wine maybe 2x per yr  . Drug use: No  . Sexual activity: Yes    Birth control/ protection: None   Other Topics Concern  . None   Social History Narrative  . None     FAMILY HISTORY:  We obtained a detailed, 4-generation family history.  Significant diagnoses are listed below: Family History  Problem Relation Age of Onset  . Arthritis Mother   . Stroke Mother   . Stroke Maternal Aunt   . Stroke Maternal Uncle   . Breast cancer Maternal Grandmother     dx at 47 or younger; d. 73y  . Lung cancer Maternal Grandfather   . Stroke Paternal Grandmother   . Uterine cancer Paternal Grandmother     dx. 61-62  . Other Paternal Grandmother     colostomy bag  in her 7s; unspecified reason  . Lung cancer Paternal Grandfather   . Uterine cancer Maternal Aunt     dx unspecified age  . Breast cancer Other     maternal great grandmother (MGM's mother) d. 44    Brianna James has a one-year old son.  She has one full brother who is 63.  He has one 18-year old daughter.  Brianna James also has two paternal half-sisters, ages 54 and 39.  Brianna James mother is currently 35 and has never had cancer.  Her father is 51 and has never had cancer.  Brianna James mother has five full sisters and two full brothers, ages 70 to mid- to late-60s.  She believes that one aunt was diagnosed with uterine cancer, but is not sure at what age.  She is unaware of any other history of cancer in her other aunts and uncles.  She has  some limited information for her maternal first cousins.  Her maternal grandmother died of breast cancer at 43.  She has no information for her grandmother's siblings.  Her grandmother's mother died of breast cancer at 93.  Brianna James maternal grandfather is currently in his late 27s and has not had cancer.  She has no information for his siblings or parents.  Brianna James father has four maternal half-siblings--one sister and three brothers--all of whom are in their late 57s-late 60s.  She reports no history of cancer for these relatives or for any of their children.  Her paternal grandmother was diagnosed with uterine cancer at 58-62 and she passed away at 63. Brianna James also recalls her having a colostomy, but is unsure as to the cause for this.  Brianna James paternal grandfather died when her father was only 64 years old; she has no further information for him or for any other paternal relatives.  Brianna James is unaware of any previous family history of genetic testing for hereditary cancer.  Patient's maternal ancestors are of [place] American and Native American descent, and paternal ancestors are of [place] American and Caucasian descent. There is no reported Ashkenazi Jewish ancestry. There is no known consanguinity.  GENETIC COUNSELING ASSESSMENT: Brianna James is a 35 y.o. female with a personal and family history of breast cancer which is somewhat suggestive of a hereditary cancer syndrome and predisposition to cancer. We, therefore, discussed and recommended the following at today's visit.   DISCUSSION: We reviewed the characteristics, features and inheritance patterns of hereditary cancer syndromes, particularly those caused by mutations within the BRCA1/2 genes. We also discussed genetic testing, including the appropriate family members to test, the process of testing, insurance coverage and turn-around-time for results. We discussed the implications of a negative, positive and/or variant of  uncertain significant result. We recommended Ms. Len pursue genetic testing for the 20-gene Breast/Ovarian Cancer Panel and MSH2 Exons 1-7 Inversion Analysis.  The Breast/Ovarian Cancer Panel offered by [facility] Hope Pigeon, MD) includes sequencing and deletion/duplication analysis for the following 19 genes:  ATM, BARD1, BRCA1, BRCA2, BRIP1, CDH1, CHEK2, FANCC, MLH1, MSH2, MSH6, NBN, PALB2, PMS2, PTEN, RAD51C, RAD51D, TP53, and XRCC2.  This panel also includes deletion/duplication analysis (without sequencing) for one gene, EPCAM.  Based on Ms. Dapolito personal and family history of cancer, she meets medical criteria for genetic testing. Despite that she meets criteria, she may still have an out of pocket cost. We discussed that if her out of pocket cost for testing is over $100, the laboratory will call and confirm whether she  wants to proceed with testing.  If the out of pocket cost of testing is less than $100 she will be billed by the genetic testing laboratory.   PLAN: After considering the risks, benefits, and limitations, Ms. Luddy  provided informed consent to pursue genetic testing and the blood sample was sent to [facility] for analysis of the 20-gene Breast/Ovarian Cancer Panel with MSH2 Exons 1-7 Inversion Analysis. Results should be available within approximately 2 weeks' time, at which point they will be disclosed by telephone to Ms. Talkington, as will any additional recommendations warranted by these results. Ms. Khaimov will receive a summary of her genetic counseling visit and a copy of her results once available. This information will also be available in Epic. We encouraged Ms. Hottinger to remain in contact with cancer James annually so that we can continuously update the family history and inform her of any changes in cancer James and testing that may be of benefit for her family. Ms. Bouie questions were answered to her satisfaction today. Our contact information was  provided should additional questions or concerns arise.  Thank you for the referral and allowing [place] to share in the care of your patient.   Jeanine Luz, MS, [facility] Certified Genetic Counselor [email or web address] Phone: [phone]  The patient was seen for a total of 60 minutes in face-to-face genetic counseling.  This patient was discussed with Drs. Magrinat, Lindi Adie and/or Burr Medico who agrees with the above.    _______________________________________________________________________ For Office Staff:  Number of people involved in session: 2 Was an Intern/ student involved with case: no

## 2016-01-04 NOTE — Telephone Encounter (Signed)
Referral made per Dr. Geralyn Flash request for fertility specialist at [facility].  Appointment made for 01/08/16 at 9:00 with Dr. Ulice Brilliant.  I left VM with pt letting her know if this appointment information and encouraged her to let me know if she does not wish to go this avenue or if she has any questions or concerns.

## 2016-01-04 NOTE — Addendum Note (Signed)
Addended by: Cheree Ditto on: 01/04/2016 04:42 PM   Modules accepted: Orders

## 2016-01-05 NOTE — Telephone Encounter (Signed)
Pt returned call, confirmed appt with fertility specialist on 10/23.

## 2016-01-09 ENCOUNTER — Ambulatory Visit: Payer: 59

## 2016-01-09 ENCOUNTER — Ambulatory Visit
Admission: RE | Admit: 2016-01-09 | Discharge: 2016-01-09 | Disposition: A | Discharge status: Home / Self Care | Payer: 59 | Source: Ambulatory Visit | Attending: Radiation Oncology | Admitting: Radiation Oncology

## 2016-01-09 DIAGNOSIS — Z823 Family history of stroke: Secondary | ICD-10-CM | POA: Insufficient documentation

## 2016-01-09 DIAGNOSIS — Z803 Family history of malignant neoplasm of breast: Secondary | ICD-10-CM | POA: Insufficient documentation

## 2016-01-09 DIAGNOSIS — Z801 Family history of malignant neoplasm of trachea, bronchus and lung: Secondary | ICD-10-CM | POA: Insufficient documentation

## 2016-01-09 DIAGNOSIS — Z8261 Family history of arthritis: Secondary | ICD-10-CM | POA: Insufficient documentation

## 2016-01-09 DIAGNOSIS — Z170 Estrogen receptor positive status [ER+]: Secondary | ICD-10-CM | POA: Insufficient documentation

## 2016-01-09 DIAGNOSIS — Z888 Allergy status to other drugs, medicaments and biological substances status: Secondary | ICD-10-CM | POA: Insufficient documentation

## 2016-01-09 DIAGNOSIS — Z9221 Personal history of antineoplastic chemotherapy: Secondary | ICD-10-CM | POA: Insufficient documentation

## 2016-01-09 DIAGNOSIS — C50412 Malignant neoplasm of upper-outer quadrant of left female breast: Secondary | ICD-10-CM | POA: Insufficient documentation

## 2016-01-09 NOTE — Progress Notes (Signed)
Location of Breast Cancer:  Left Breast  Histology per Pathology Report:  12/19/15 Diagnosis Breast, left, needle core biopsy - INVASIVE DUCTAL CARCINOMA, SEE COMMENT. - DUCTAL CARCINOMA IN SITU.  Receptor Status: ER(NEG), PR (NEG), Her2-neu (POS), Ki-(40%)  Did patient present with symptoms or was this found on screening mammography?: She was playing with her son, when she hurt the breast and felt a lesion.   Past/Anticipated interventions by surgeon, if any: She is scheduled for Lumpectomy 01/17/16 Dr. Barry Dienes.  Past/Anticipated interventions by medical oncology, if any:  Dr. Lindi Adie 01/03/16 Recommendation: 1. Genetic testing (appointment scheduled 01/04/16) 2. Breast conserving surgery with lumpectomy (assuming she does not have a mutation that might require bilateral mastectomies) (surgery scheduled 01/17/16 per EPIC) 3. Adjuvant chemotherapy with TCH X 6 cycles followed by Herceptin maintenance for one year 4. Followed by adjuvant radiation  Lymphedema issues, if any:  N/A  Pain issues, if any:  She denies  SAFETY ISSUES:  Prior radiation? No  Pacemaker/ICD? No  Possible current pregnancy? No  Is the patient on methotrexate? No  Current Complaints / other details:   She had an appointment with a fertility specialist at [facility] with Dr. Ulice Brilliant 01/08/16: Counseling: We discussed oocyte or embryo freezing as a method of fertility preservation given likely need for cytotoxic chemotherapy. Timing of oocyte retrieval in the setting of cancer treatment was discussed, we could do the procedure in the two weeks between lumpectomy and start of chemotherapy.     Menarche was at age 49.  First live birth at age 76.  OCP use for approximately 2 months.  Ovaries intact: yes.  Hysterectomy: no.  Menopausal status: premenopausal.  HRT use: 0 years; history of one round of IVF.  BP 109/73   Pulse 94   Temp 98.9 F (37.2 C)   Ht '5\' 7"'  (1.702 m)   Wt 219 lb 3.2 oz (99.4  kg)   LMP 12/28/2015 (Exact Date)   SpO2 97% Comment: room air  BMI 34.33 kg/m    Wt Readings from Last 3 Encounters:  01/10/16 219 lb 3.2 oz (99.4 kg)  01/03/16 217 lb 4.8 oz (98.6 kg)  05/01/15 216 lb 8 oz (98.2 kg)        Malayasia Mirkin, Stephani Police, RN 01/09/2016,10:08 AM

## 2016-01-10 ENCOUNTER — Encounter: Payer: Self-pay | Admitting: Radiation Oncology

## 2016-01-10 ENCOUNTER — Telehealth: Payer: Self-pay | Admitting: *Deleted

## 2016-01-10 ENCOUNTER — Ambulatory Visit
Admission: RE | Admit: 2016-01-10 | Discharge: 2016-01-10 | Disposition: A | Discharge status: Home / Self Care | Payer: 59 | Source: Ambulatory Visit | Attending: Radiation Oncology | Admitting: Radiation Oncology

## 2016-01-10 ENCOUNTER — Encounter (HOSPITAL_BASED_OUTPATIENT_CLINIC_OR_DEPARTMENT_OTHER): Payer: Self-pay | Admitting: *Deleted

## 2016-01-10 DIAGNOSIS — Z170 Estrogen receptor positive status [ER+]: Secondary | ICD-10-CM | POA: Diagnosis not present

## 2016-01-10 DIAGNOSIS — C50412 Malignant neoplasm of upper-outer quadrant of left female breast: Secondary | ICD-10-CM

## 2016-01-10 DIAGNOSIS — Z801 Family history of malignant neoplasm of trachea, bronchus and lung: Secondary | ICD-10-CM | POA: Diagnosis not present

## 2016-01-10 DIAGNOSIS — Z8261 Family history of arthritis: Secondary | ICD-10-CM | POA: Diagnosis not present

## 2016-01-10 DIAGNOSIS — Z823 Family history of stroke: Secondary | ICD-10-CM | POA: Diagnosis not present

## 2016-01-10 DIAGNOSIS — Z171 Estrogen receptor negative status [ER-]: Principal | ICD-10-CM

## 2016-01-10 DIAGNOSIS — Z803 Family history of malignant neoplasm of breast: Secondary | ICD-10-CM | POA: Diagnosis not present

## 2016-01-10 DIAGNOSIS — Z9221 Personal history of antineoplastic chemotherapy: Secondary | ICD-10-CM | POA: Diagnosis not present

## 2016-01-10 DIAGNOSIS — Z888 Allergy status to other drugs, medicaments and biological substances status: Secondary | ICD-10-CM | POA: Diagnosis not present

## 2016-01-10 NOTE — Telephone Encounter (Signed)
"  Daren with [employer]  HR [id number]) calling to confirm the Health Care Provider Certification form we received came from your office by Dr. Lindi Adie.  We have two forms with different dates.  Form reads she is covered from November 30 th."   No copt in the electronic record at this time.  Asked he fax these forms to Triage fax to confirm.

## 2016-01-10 NOTE — Progress Notes (Signed)
Radiation Oncology         [phone] ________________________________  Initial Outpatient Consultation  Name: Brianna James MRN: [id number]  Date: 01/10/2016  DOB: 24-Apr-1980  YQ:IHKVQ, REGINA, NP  [name], MD   REFERRING PHYSICIAN: [name], MD  DIAGNOSIS:    ICD-9-CM ICD-10-CM   1. Malignant neoplasm of upper-outer quadrant of left breast in female, estrogen receptor negative ([place]) 174.4 C50.412    V86.1 Z17.1    Stage T1cN0M0 Left Breast UOQ Invasive Ductal Carcinoma w/ DCIS, ER- / PR- / Her2+, Grade III  HISTORY OF PRESENT ILLNESS::Brianna James is a 35 y.o. female who palpated a lesion on her left breast when she was playing with her son, and had some trauma to the breast.  She underwent diagnostic imaging which revealed a 1.6 cm mass in the upper outer quadrant of the left breast. Biopsy on 12/19/15 of the left breast revealed invasive ductal carcinoma with DCIS, grade III. Receptor status is ER (0%), PR (0%), Her2 (+), Ki67 (40%). She is scheduled for a lumpectomy on 01/17/16 with Dr. Barry Dienes. She was seen by Dr. Lindi Adie on 01/03/16. He recommended genetic testing (she saw Jeanine Luz of genetic counseling last week and results are pending.), breast conserving surgery with lumpectomy, adjuvant chemotherapy with TCH X 6 cycles followed by Herceptin maintenance for one year, followed by adjuvant radiation.   Patient denies lymphedema issues or pain at this time.  Of note, the patient was seen by a fertility specialist at [facility] with Dr. Ulice Brilliant on 01/08/16. They discussed oocyte or embryo freezing as a method of fertility preservation given the likely need for cytotoxic chemotherapy. Timing of oocyte retrieval in the setting of cancer treatment was discussed. It was recommended to do the procedure in the two weeks between lumpectomy and start of chemotherapy.  Her son is 106 yrs old. She is here with her husband. 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  PREVIOUS  RADIATION THERAPY: No  PAST MEDICAL HISTORY:  has a past medical history of Cancer ([place]); Chicken pox; and HSV infection.    PAST SURGICAL HISTORY: Past Surgical History:  Procedure Laterality Date  . WISDOM TOOTH EXTRACTION      FAMILY HISTORY: family history includes Arthritis in her mother; Breast cancer in her maternal grandmother and other; Lung cancer in her maternal grandfather and paternal grandfather; Other in her paternal grandmother; Stroke in her maternal aunt, maternal uncle, mother, and paternal grandmother; Uterine cancer in her maternal aunt and paternal grandmother.  SOCIAL HISTORY:  reports that she has never smoked. She has never used smokeless tobacco. She reports that she drinks alcohol. She reports that she does not use drugs.  ALLERGIES: Peanuts [peanut oil]  MEDICATIONS:  No current outpatient prescriptions on file.   No current facility-administered medications for this encounter.     REVIEW OF SYSTEMS:  Notable for that above. Otherwise in her USOH.   PHYSICAL EXAM:  height is '5\' 7"'  (1.702 m) and weight is 219 lb 3.2 oz (99.4 kg). Her temperature is 98.9 F (37.2 C). Her blood pressure is 109/73 and her pulse is 94. Her oxygen saturation is 97%.   General: Alert and oriented, in no acute distress HEENT: Head is normocephalic. Extraocular movements are intact. Oropharynx is clear.    Neck: Neck is supple, no palpable cervical or supraclavicular lymphadenopathy. Heart: Regular in rate and rhythm with no murmurs, rubs, or gallops. Chest: Clear to auscultation bilaterally, with no rhonchi, wheezes, or rales. Abdomen:  Soft, nontender, nondistended, with no rigidity or guarding. Extremities: No cyanosis or edema. Lymphatics: see Neck Exam Skin: No concerning lesions. Musculoskeletal: symmetric strength and muscle tone throughout. Neurologic: Cranial nerves II through XII are grossly intact. No obvious focalities. Speech is fluent. Coordination is  intact. Psychiatric: Judgment and insight are intact. Affect is appropriate. Breasts: In upper outer quadrant of left breast, there is a palpable mass that is about 3 cm in greatest dimension. It is difficult to tell because of post-biopsy fluid. No other palpable masses appreciated in the breasts or axillae.    ECOG = 0  0 - Asymptomatic (Fully active, able to carry on all predisease activities without restriction)  1 - Symptomatic but completely ambulatory (Restricted in physically strenuous activity but ambulatory and able to carry out work of a light or sedentary nature. For example, light housework, office work)  2 - Symptomatic, <50% in bed during the day (Ambulatory and capable of all self care but unable to carry out any work activities. Up and about more than 50% of waking hours)  3 - Symptomatic, >50% in bed, but not bedbound (Capable of only limited self-care, confined to bed or chair 50% or more of waking hours)  4 - Bedbound (Completely disabled. Cannot carry on any self-care. Totally confined to bed or chair)  5 - Death   Eustace Pen MM, Creech RH, Tormey DC, et al. [phone]). "Toxicity and response criteria of the [facility] Group". [place] Oncol. 5 (6): 649-55   LABORATORY DATA:  Lab Results  Component Value Date   WBC 18.6 (H) 07/25/2014   HGB 10.0 (L) 07/25/2014   HCT 30.3 (L) 07/25/2014   MCV 81.7 07/25/2014   PLT 198 07/25/2014   CMP  No results found for: NA, K, CL, CO2, GLUCOSE, BUN, CREATININE, CALCIUM, PROT, ALBUMIN, AST, ALT, ALKPHOS, BILITOT, GFRNONAA, GFRAA      RADIOGRAPHY: as above    IMPRESSION/PLAN:  Stage IA left breast cancer, ER negative It was a pleasure meeting the patient today. We discussed the risks, benefits, and side effects of radiotherapy. I recommend radiotherapy to the left breast to reduce her risk of locoregional recurrence by 2/3.  We discussed that radiation would take approximately 6-7 weeks to complete.  Radiation  would start 3-4 weeks after completing chemotherapy. We spoke about acute effects including skin irritation and fatigue as well as much less common late effects including internal organ injury or irritation. We spoke about the latest technology that is used to minimize the risk of late effects for patients undergoing radiotherapy to the breast or chest wall. No guarantees of treatment were given. The patient is enthusiastic about proceeding with treatment. I look forward to participating in the patient's care. Consent signed today.    Eppie Gibson, MD  This document serves as a record of services personally performed by Eppie Gibson, MD. It was created on her behalf by Bethann Humble, a trained medical scribe. The creation of this record is based on the scribe's personal observations and the provider's statements to them. This document has been checked and approved by the attending provider.

## 2016-01-11 NOTE — Telephone Encounter (Signed)
Attempted to call Daren with [employer] HR back- number is not in service. No other numbers noted to attempt to call back.

## 2016-01-15 NOTE — Pre-Procedure Instructions (Signed)
Patient given boost drink for 01-17-16 with instruction to drink by 0730. Pt voiced understanding.

## 2016-01-16 NOTE — Telephone Encounter (Signed)
Brianna James called from [employer] to follow up on paperwork. Clarified pt needs leave from 01/03/2016 to 01/02/2017 form signed by MD and re-faxed to [facility].  Callback # [phone] fax# [phone]

## 2016-01-17 ENCOUNTER — Ambulatory Visit (HOSPITAL_BASED_OUTPATIENT_CLINIC_OR_DEPARTMENT_OTHER): Payer: 59 | Admitting: Anesthesiology

## 2016-01-17 ENCOUNTER — Ambulatory Visit (HOSPITAL_COMMUNITY): Payer: 59

## 2016-01-17 ENCOUNTER — Encounter (HOSPITAL_BASED_OUTPATIENT_CLINIC_OR_DEPARTMENT_OTHER)
Admission: RE | Disposition: A | Discharge status: Home / Self Care | Payer: Self-pay | Source: Ambulatory Visit | Attending: General Surgery

## 2016-01-17 ENCOUNTER — Ambulatory Visit (HOSPITAL_COMMUNITY)
Admission: RE | Admit: 2016-01-17 | Discharge: 2016-01-17 | Disposition: A | Discharge status: Home / Self Care | Payer: 59 | Source: Ambulatory Visit | Attending: General Surgery | Admitting: General Surgery

## 2016-01-17 ENCOUNTER — Ambulatory Visit (HOSPITAL_BASED_OUTPATIENT_CLINIC_OR_DEPARTMENT_OTHER)
Admission: RE | Admit: 2016-01-17 | Discharge: 2016-01-17 | Disposition: A | Discharge status: Home / Self Care | Payer: 59 | Source: Ambulatory Visit | Attending: General Surgery | Admitting: General Surgery

## 2016-01-17 ENCOUNTER — Encounter (HOSPITAL_BASED_OUTPATIENT_CLINIC_OR_DEPARTMENT_OTHER): Payer: Self-pay | Admitting: *Deleted

## 2016-01-17 DIAGNOSIS — C50912 Malignant neoplasm of unspecified site of left female breast: Secondary | ICD-10-CM | POA: Diagnosis present

## 2016-01-17 DIAGNOSIS — C773 Secondary and unspecified malignant neoplasm of axilla and upper limb lymph nodes: Secondary | ICD-10-CM | POA: Insufficient documentation

## 2016-01-17 DIAGNOSIS — Z8049 Family history of malignant neoplasm of other genital organs: Secondary | ICD-10-CM | POA: Insufficient documentation

## 2016-01-17 DIAGNOSIS — Z833 Family history of diabetes mellitus: Secondary | ICD-10-CM | POA: Insufficient documentation

## 2016-01-17 DIAGNOSIS — Z8041 Family history of malignant neoplasm of ovary: Secondary | ICD-10-CM | POA: Insufficient documentation

## 2016-01-17 DIAGNOSIS — D0512 Intraductal carcinoma in situ of left breast: Secondary | ICD-10-CM | POA: Insufficient documentation

## 2016-01-17 DIAGNOSIS — Z803 Family history of malignant neoplasm of breast: Secondary | ICD-10-CM | POA: Insufficient documentation

## 2016-01-17 DIAGNOSIS — Z171 Estrogen receptor negative status [ER-]: Secondary | ICD-10-CM | POA: Diagnosis not present

## 2016-01-17 DIAGNOSIS — C801 Malignant (primary) neoplasm, unspecified: Secondary | ICD-10-CM

## 2016-01-17 DIAGNOSIS — K5900 Constipation, unspecified: Secondary | ICD-10-CM | POA: Diagnosis not present

## 2016-01-17 DIAGNOSIS — Z823 Family history of stroke: Secondary | ICD-10-CM | POA: Diagnosis not present

## 2016-01-17 DIAGNOSIS — R1310 Dysphagia, unspecified: Secondary | ICD-10-CM | POA: Insufficient documentation

## 2016-01-17 DIAGNOSIS — R079 Chest pain, unspecified: Secondary | ICD-10-CM | POA: Diagnosis not present

## 2016-01-17 DIAGNOSIS — Z95828 Presence of other vascular implants and grafts: Secondary | ICD-10-CM

## 2016-01-17 DIAGNOSIS — Z836 Family history of other diseases of the respiratory system: Secondary | ICD-10-CM | POA: Diagnosis not present

## 2016-01-17 DIAGNOSIS — C50412 Malignant neoplasm of upper-outer quadrant of left female breast: Secondary | ICD-10-CM

## 2016-01-17 DIAGNOSIS — Z8249 Family history of ischemic heart disease and other diseases of the circulatory system: Secondary | ICD-10-CM | POA: Diagnosis not present

## 2016-01-17 HISTORY — DX: Malignant (primary) neoplasm, unspecified: C80.1

## 2016-01-17 HISTORY — PX: BREAST LUMPECTOMY WITH RADIOACTIVE SEED AND SENTINEL LYMPH NODE BIOPSY: SHX6550

## 2016-01-17 HISTORY — PX: PORTACATH PLACEMENT: SHX2246

## 2016-01-17 SURGERY — BREAST LUMPECTOMY WITH RADIOACTIVE SEED AND SENTINEL LYMPH NODE BIOPSY
Anesthesia: General | Site: Chest | Laterality: Right

## 2016-01-17 MED ORDER — CEFAZOLIN SODIUM-DEXTROSE 2-4 GM/100ML-% IV SOLN
INTRAVENOUS | Status: AC
  Filled 2016-01-17: qty 100

## 2016-01-17 MED ORDER — FENTANYL CITRATE (PF) 100 MCG/2ML IJ SOLN
INTRAMUSCULAR | Status: AC
  Filled 2016-01-17: qty 2

## 2016-01-17 MED ORDER — LIDOCAINE 2% (20 MG/ML) 5 ML SYRINGE
INTRAMUSCULAR | Status: DC | PRN
  Administered 2016-01-17: 60 mg via INTRAVENOUS

## 2016-01-17 MED ORDER — METHYLENE BLUE 0.5 % INJ SOLN
INTRAVENOUS | Status: AC
  Filled 2016-01-17: qty 10

## 2016-01-17 MED ORDER — HEPARIN (PORCINE) IN NACL 2-0.9 UNIT/ML-% IJ SOLN
INTRAMUSCULAR | Status: AC
  Filled 2016-01-17: qty 500

## 2016-01-17 MED ORDER — OXYCODONE HCL 5 MG PO TABS
ORAL_TABLET | ORAL | Status: AC
  Filled 2016-01-17: qty 1

## 2016-01-17 MED ORDER — LIDOCAINE-EPINEPHRINE (PF) 1 %-1:200000 IJ SOLN
INTRAMUSCULAR | Status: DC | PRN
  Administered 2016-01-17: 19 mL

## 2016-01-17 MED ORDER — GLYCOPYRROLATE 0.2 MG/ML IJ SOLN: 0.2000 mg | Freq: Once | INTRAMUSCULAR | Status: DC | PRN

## 2016-01-17 MED ORDER — SODIUM CHLORIDE 0.9 % IJ SOLN
INTRAVENOUS | Status: DC | PRN
  Administered 2016-01-17: 5 mL

## 2016-01-17 MED ORDER — PROPOFOL 10 MG/ML IV BOLUS
INTRAVENOUS | Status: DC | PRN
  Administered 2016-01-17: 170 mg via INTRAVENOUS

## 2016-01-17 MED ORDER — SCOPOLAMINE 1 MG/3DAYS TD PT72
1.0000 | MEDICATED_PATCH | Freq: Once | TRANSDERMAL | Status: DC | PRN
  Administered 2016-01-17: 1.5 mg via TRANSDERMAL

## 2016-01-17 MED ORDER — LIDOCAINE 2% (20 MG/ML) 5 ML SYRINGE
INTRAMUSCULAR | Status: AC
  Filled 2016-01-17: qty 5

## 2016-01-17 MED ORDER — LACTATED RINGERS IV SOLN
INTRAVENOUS | Status: DC
  Administered 2016-01-17 (×4): via INTRAVENOUS

## 2016-01-17 MED ORDER — OXYCODONE HCL 5 MG PO TABS
5.0000 mg | ORAL_TABLET | Freq: Once | ORAL | Status: AC | PRN
  Administered 2016-01-17: 5 mg via ORAL

## 2016-01-17 MED ORDER — FENTANYL CITRATE (PF) 100 MCG/2ML IJ SOLN
50.0000 ug | INTRAMUSCULAR | Status: AC | PRN
  Administered 2016-01-17: 25 ug via INTRAVENOUS
  Administered 2016-01-17 (×2): 100 ug via INTRAVENOUS
  Administered 2016-01-17 (×3): 25 ug via INTRAVENOUS

## 2016-01-17 MED ORDER — TECHNETIUM TC 99M SULFUR COLLOID FILTERED
1.0000 | Freq: Once | INTRAVENOUS | Status: AC | PRN
  Administered 2016-01-17: 1 via INTRADERMAL

## 2016-01-17 MED ORDER — ACETAMINOPHEN 500 MG PO TABS
ORAL_TABLET | ORAL | Status: AC
  Filled 2016-01-17: qty 2

## 2016-01-17 MED ORDER — HEPARIN (PORCINE) IN NACL 2-0.9 UNIT/ML-% IJ SOLN
INTRAMUSCULAR | Status: DC | PRN
  Administered 2016-01-17: 500 mL via INTRAVENOUS

## 2016-01-17 MED ORDER — CHLORHEXIDINE GLUCONATE CLOTH 2 % EX PADS: 6.0000 | MEDICATED_PAD | Freq: Once | CUTANEOUS | Status: DC

## 2016-01-17 MED ORDER — CEFAZOLIN SODIUM-DEXTROSE 2-4 GM/100ML-% IV SOLN
2.0000 g | INTRAVENOUS | Status: AC
  Administered 2016-01-17: 2 g via INTRAVENOUS

## 2016-01-17 MED ORDER — GABAPENTIN 300 MG PO CAPS
300.0000 mg | ORAL_CAPSULE | ORAL | Status: AC
  Administered 2016-01-17: 300 mg via ORAL

## 2016-01-17 MED ORDER — HYDROMORPHONE HCL 1 MG/ML IJ SOLN
INTRAMUSCULAR | Status: AC
  Filled 2016-01-17: qty 1

## 2016-01-17 MED ORDER — GABAPENTIN 300 MG PO CAPS
ORAL_CAPSULE | ORAL | Status: AC
  Filled 2016-01-17: qty 1

## 2016-01-17 MED ORDER — PROMETHAZINE HCL 25 MG/ML IJ SOLN
6.2500 mg | INTRAMUSCULAR | Status: DC | PRN
Start: 2016-01-17 — End: 2016-01-17
  Administered 2016-01-17: 6.25 mg via INTRAVENOUS

## 2016-01-17 MED ORDER — DEXAMETHASONE SODIUM PHOSPHATE 10 MG/ML IJ SOLN
INTRAMUSCULAR | Status: AC
  Filled 2016-01-17: qty 1

## 2016-01-17 MED ORDER — MIDAZOLAM HCL 2 MG/2ML IJ SOLN
1.0000 mg | INTRAMUSCULAR | Status: DC | PRN
  Administered 2016-01-17 (×2): 2 mg via INTRAVENOUS

## 2016-01-17 MED ORDER — SODIUM CHLORIDE 0.9 % IJ SOLN
INTRAMUSCULAR | Status: AC
  Filled 2016-01-17: qty 10

## 2016-01-17 MED ORDER — ACETAMINOPHEN 500 MG PO TABS
1000.0000 mg | ORAL_TABLET | ORAL | Status: AC
  Administered 2016-01-17: 1000 mg via ORAL

## 2016-01-17 MED ORDER — SCOPOLAMINE 1 MG/3DAYS TD PT72
MEDICATED_PATCH | TRANSDERMAL | Status: AC
  Filled 2016-01-17: qty 1

## 2016-01-17 MED ORDER — HYDROMORPHONE HCL 1 MG/ML IJ SOLN
0.2500 mg | INTRAMUSCULAR | Status: DC | PRN
  Administered 2016-01-17: 0.5 mg via INTRAVENOUS
  Administered 2016-01-17: 0.25 mg via INTRAVENOUS
  Administered 2016-01-17: 0.5 mg via INTRAVENOUS
  Administered 2016-01-17 (×2): 0.25 mg via INTRAVENOUS

## 2016-01-17 MED ORDER — ONDANSETRON HCL 4 MG/2ML IJ SOLN
INTRAMUSCULAR | Status: AC
  Filled 2016-01-17: qty 2

## 2016-01-17 MED ORDER — MIDAZOLAM HCL 2 MG/2ML IJ SOLN
INTRAMUSCULAR | Status: AC
  Filled 2016-01-17: qty 2

## 2016-01-17 MED ORDER — ONDANSETRON HCL 4 MG/2ML IJ SOLN
INTRAMUSCULAR | Status: DC | PRN
  Administered 2016-01-17: 4 mg via INTRAVENOUS

## 2016-01-17 MED ORDER — OXYCODONE HCL 5 MG PO TABS: 5.0000 mg | ORAL_TABLET | Freq: Four times a day (QID) | ORAL | 0 refills | Status: DC | PRN

## 2016-01-17 MED ORDER — HEPARIN SOD (PORK) LOCK FLUSH 100 UNIT/ML IV SOLN
INTRAVENOUS | Status: DC | PRN
  Administered 2016-01-17: 500 [IU] via INTRAVENOUS

## 2016-01-17 MED ORDER — DEXAMETHASONE SODIUM PHOSPHATE 4 MG/ML IJ SOLN
INTRAMUSCULAR | Status: DC | PRN
  Administered 2016-01-17: 10 mg via INTRAVENOUS

## 2016-01-17 MED ORDER — BUPIVACAINE HCL (PF) 0.5 % IJ SOLN
INTRAMUSCULAR | Status: DC | PRN
  Administered 2016-01-17: 30 mL

## 2016-01-17 MED ORDER — HEPARIN SOD (PORK) LOCK FLUSH 100 UNIT/ML IV SOLN
INTRAVENOUS | Status: AC
  Filled 2016-01-17: qty 5

## 2016-01-17 MED ORDER — PROMETHAZINE HCL 25 MG/ML IJ SOLN
INTRAMUSCULAR | Status: AC
  Filled 2016-01-17: qty 1

## 2016-01-17 SURGICAL SUPPLY — 79 items
APPLIER CLIP 9.375 MED OPEN (MISCELLANEOUS)
BAG DECANTER FOR FLEXI CONT (MISCELLANEOUS) ×4 IMPLANT
BINDER BREAST LRG (GAUZE/BANDAGES/DRESSINGS) IMPLANT
BINDER BREAST MEDIUM (GAUZE/BANDAGES/DRESSINGS) IMPLANT
BINDER BREAST XLRG (GAUZE/BANDAGES/DRESSINGS) ×4 IMPLANT
BINDER BREAST XXLRG (GAUZE/BANDAGES/DRESSINGS) IMPLANT
BLADE HEX COATED 2.75 (ELECTRODE) ×4 IMPLANT
BLADE SURG 10 STRL SS (BLADE) ×4 IMPLANT
BLADE SURG 11 STRL SS (BLADE) ×4 IMPLANT
BLADE SURG 15 STRL LF DISP TIS (BLADE) ×2 IMPLANT
BLADE SURG 15 STRL SS (BLADE) ×2
BNDG COHESIVE 4X5 TAN STRL (GAUZE/BANDAGES/DRESSINGS) ×4 IMPLANT
CANISTER SUC SOCK COL 7IN (MISCELLANEOUS) IMPLANT
CANISTER SUCT 1200ML W/VALVE (MISCELLANEOUS) ×4 IMPLANT
CHLORAPREP W/TINT 26ML (MISCELLANEOUS) ×4 IMPLANT
CLIP APPLIE 9.375 MED OPEN (MISCELLANEOUS) IMPLANT
CLIP TI LARGE 6 (CLIP) ×4 IMPLANT
CLIP TI MEDIUM 6 (CLIP) ×8 IMPLANT
CLIP TI WIDE RED SMALL 6 (CLIP) IMPLANT
CLOSURE STERI-STRIP 1/2X4 (GAUZE/BANDAGES/DRESSINGS) ×1
CLOSURE WOUND 1/2 X4 (GAUZE/BANDAGES/DRESSINGS) ×1
CLSR STERI-STRIP ANTIMIC 1/2X4 (GAUZE/BANDAGES/DRESSINGS) ×3 IMPLANT
COVER BACK TABLE 60X90IN (DRAPES) ×4 IMPLANT
COVER MAYO STAND STRL (DRAPES) ×4 IMPLANT
COVER PROBE W GEL 5X96 (DRAPES) ×4 IMPLANT
DECANTER SPIKE VIAL GLASS SM (MISCELLANEOUS) IMPLANT
DERMABOND ADVANCED (GAUZE/BANDAGES/DRESSINGS) ×4
DERMABOND ADVANCED .7 DNX12 (GAUZE/BANDAGES/DRESSINGS) ×4 IMPLANT
DEVICE DUBIN W/COMP PLATE 8390 (MISCELLANEOUS) ×4 IMPLANT
DRAPE C-ARM 42X72 X-RAY (DRAPES) ×4 IMPLANT
DRAPE LAPAROTOMY TRNSV 102X78 (DRAPE) ×4 IMPLANT
DRAPE UTILITY XL STRL (DRAPES) ×4 IMPLANT
DRSG PAD ABDOMINAL 8X10 ST (GAUZE/BANDAGES/DRESSINGS) ×4 IMPLANT
DRSG TEGADERM 4X4.75 (GAUZE/BANDAGES/DRESSINGS) ×4 IMPLANT
ELECT COATED BLADE 2.86 ST (ELECTRODE) ×4 IMPLANT
ELECT REM PT RETURN 9FT ADLT (ELECTROSURGICAL) ×4
ELECTRODE REM PT RTRN 9FT ADLT (ELECTROSURGICAL) ×2 IMPLANT
GLOVE BIO SURGEON STRL SZ 6 (GLOVE) ×4 IMPLANT
GLOVE BIOGEL PI IND STRL 6.5 (GLOVE) ×2 IMPLANT
GLOVE BIOGEL PI INDICATOR 6.5 (GLOVE) ×2
GOWN STRL REUS W/ TWL LRG LVL3 (GOWN DISPOSABLE) ×2 IMPLANT
GOWN STRL REUS W/TWL 2XL LVL3 (GOWN DISPOSABLE) ×4 IMPLANT
GOWN STRL REUS W/TWL LRG LVL3 (GOWN DISPOSABLE) ×2
ILLUMINATOR WAVEGUIDE N/F (MISCELLANEOUS) IMPLANT
KIT MARKER MARGIN INK (KITS) ×4 IMPLANT
KIT PORT POWER ISP 8FR (Catheter) ×4 IMPLANT
LIGHT WAVEGUIDE WIDE FLAT (MISCELLANEOUS) ×4 IMPLANT
NDL SAFETY ECLIPSE 18X1.5 (NEEDLE) IMPLANT
NEEDLE HYPO 18GX1.5 SHARP (NEEDLE)
NEEDLE HYPO 25X1 1.5 SAFETY (NEEDLE) ×4 IMPLANT
NS IRRIG 1000ML POUR BTL (IV SOLUTION) ×4 IMPLANT
PACK BASIN DAY SURGERY FS (CUSTOM PROCEDURE TRAY) ×4 IMPLANT
PACK UNIVERSAL I (CUSTOM PROCEDURE TRAY) ×4 IMPLANT
PENCIL BUTTON HOLSTER BLD 10FT (ELECTRODE) ×4 IMPLANT
SLEEVE SCD COMPRESS KNEE MED (MISCELLANEOUS) ×4 IMPLANT
SPONGE GAUZE 4X4 12PLY STER LF (GAUZE/BANDAGES/DRESSINGS) ×4 IMPLANT
SPONGE LAP 18X18 X RAY DECT (DISPOSABLE) ×8 IMPLANT
STAPLER VISISTAT 35W (STAPLE) IMPLANT
STOCKINETTE IMPERVIOUS LG (DRAPES) ×4 IMPLANT
STRIP CLOSURE SKIN 1/2X4 (GAUZE/BANDAGES/DRESSINGS) ×3 IMPLANT
SUT ETHILON 2 0 FS 18 (SUTURE) IMPLANT
SUT MNCRL AB 4-0 PS2 18 (SUTURE) ×4 IMPLANT
SUT MON AB 5-0 PS2 18 (SUTURE) IMPLANT
SUT PROLENE 2 0 SH DA (SUTURE) ×8 IMPLANT
SUT SILK 2 0 SH (SUTURE) IMPLANT
SUT VIC AB 2-0 SH 27 (SUTURE) ×2
SUT VIC AB 2-0 SH 27XBRD (SUTURE) ×2 IMPLANT
SUT VIC AB 3-0 SH 27 (SUTURE) ×2
SUT VIC AB 3-0 SH 27X BRD (SUTURE) ×2 IMPLANT
SUT VIC AB 5-0 PS2 18 (SUTURE) IMPLANT
SUT VICRYL 3-0 CR8 SH (SUTURE) ×4 IMPLANT
SYR 10ML LL (SYRINGE) ×4 IMPLANT
SYR 5ML LUER SLIP (SYRINGE) ×4 IMPLANT
SYR CONTROL 10ML LL (SYRINGE) ×4 IMPLANT
TOWEL OR 17X24 6PK STRL BLUE (TOWEL DISPOSABLE) ×4 IMPLANT
TOWEL OR NON WOVEN STRL DISP B (DISPOSABLE) ×4 IMPLANT
TUBE CONNECTING 20'X1/4 (TUBING) ×1
TUBE CONNECTING 20X1/4 (TUBING) ×3 IMPLANT
YANKAUER SUCT BULB TIP NO VENT (SUCTIONS) ×4 IMPLANT

## 2016-01-17 NOTE — H&P (Signed)
Brianna James 12/28/2015 9:46 AM Location: [facility] Patient #: [id number] DOB: 08-28-1980 Married / Language: English / Race: Black or African American Female   History of Present Illness Brianna Klein MD; 12/29/2015 11:00 AM) Patient words: Left breast cancer.  The patient is a 35 year old female who presents with breast cancer. Pt is a 35 yo F referred by Dr. Isaiah James for consultation regarding a new diagnosis of left breast cancer. The patient presented with a palpable mass in the upper outer quadrant of the left breast. She says she was horseplaying with her 84 year old son and he struck her in the breast. When it was sore, she rubbed it and it felt like there was something there. She got diagnostic imaging performed and a 1.6 cm irregular oval mass was identified in the posterior depth of the upper outer quadrant on the left. Core needle biopsy was performed and the path showed invasive ductal carcinoma, grade 3 wtih high grade DCIS. Prognostic panel not available at the time of discussion with the patient.   Prognostic panel is not available in print at the time of this report, but verbally is ER/PR negative, Her 2 positive, with Ki 67 at 40%.   Images of mammo/ultrasound as well as reports were reviewed.    Other Problems Brianna James, Brianna James; 12/28/2015 9:47 AM) No pertinent past medical history  Past Surgical History Brianna James, Brianna James; 12/28/2015 9:47 AM) Breast Biopsy Left.  Diagnostic Studies History Brianna James, Brianna James; 12/28/2015 9:47 AM) Colonoscopy never Mammogram within last year Pap Smear 1-5 years ago  Allergies Brianna James, Brianna James; 12/28/2015 9:47 AM) Peanuts  Medication History Brianna James, Brianna James; 12/28/2015 9:47 AM) No Current Medications Medications Reconciled  Social History Brianna James, Brianna James; 12/28/2015 9:47 AM) Alcohol use Occasional alcohol use. Caffeine use Carbonated beverages, Coffee, Tea. No drug use Tobacco  use Never smoker.  Family History Brianna James, Brianna James; 12/28/2015 9:47 AM) Breast Cancer Family Members In General. Cerebrovascular Accident Family Members In General, Mother. Cervical Cancer Family Members In General. Diabetes Mellitus Family Members In General. Hypertension Family Members In General. Ovarian Cancer Family Members In General. Respiratory Condition Family Members In General.  Pregnancy / Birth History Brianna James, Brianna James; 12/28/2015 9:47 AM) Age at menarche 70 years. Contraceptive History Oral contraceptives. Gravida 1 Length (months) of breastfeeding 3-6 Maternal age 14-35 Para 1 Regular periods    Review of Systems Brianna James Brianna James; 12/28/2015 9:47 AM) General Present- Fatigue and Weight Gain. Not Present- Appetite Loss, Chills, Fever, Night Sweats and Weight Loss. Skin Present- Hives. Not Present- Change in Wart/Mole, Dryness, Jaundice, New Lesions, Non-Healing Wounds, Rash and Ulcer. HEENT Present- Seasonal Allergies. Not Present- Earache, Hearing Loss, Hoarseness, Nose Bleed, Oral Ulcers, Ringing in the Ears, Sinus Pain, Sore Throat, Visual Disturbances, Wears glasses/contact lenses and Yellow Eyes. Respiratory Not Present- Bloody sputum, Chronic Cough, Difficulty Breathing, Snoring and Wheezing. Breast Present- Breast Mass and Breast Pain. Not Present- Nipple Discharge and Skin Changes. Cardiovascular Present- Chest Pain. Not Present- Difficulty Breathing Lying Down, Leg Cramps, Palpitations, Rapid Heart Rate, Shortness of Breath and Swelling of Extremities. Gastrointestinal Present- Bloating. Not Present- Abdominal Pain, Bloody Stool, Change in Bowel Habits, Chronic diarrhea, Constipation, Difficulty Swallowing, Excessive gas, Gets full quickly at meals, Hemorrhoids, Indigestion, Nausea, Rectal Pain and Vomiting. Female Genitourinary Not Present- Frequency, Nocturia, Painful Urination, Pelvic Pain and Urgency. Musculoskeletal Not Present- Back  Pain, Joint Pain, Joint Stiffness, Muscle Pain, Muscle Weakness and Swelling of Extremities. Neurological Not Present- Decreased Memory, Fainting,  Headaches, Numbness, Seizures, Tingling, Tremor, Trouble walking and Weakness. Psychiatric Present- Frequent crying. Not Present- Anxiety, Bipolar, Change in Sleep Pattern, Depression and Fearful. Endocrine Not Present- Cold Intolerance, Excessive Hunger, Hair Changes, Heat Intolerance, Hot flashes and New Diabetes. Hematology Present- Easy Bruising. Not Present- Blood Thinners, Excessive bleeding, Gland problems, HIV and Persistent Infections.  Vitals Brianna James Brianna James; 12/28/2015 9:48 AM) 12/28/2015 9:47 AM Weight: 218.2 lb Height: 67in Height was reported by patient. Body Surface Area: 2.1 m Body Mass Index: 34.17 kg/m  Temp.: 98.84F  Pulse: 74 (Regular)  BP: 118/72 (Sitting, Left Arm, Standard)       Physical Exam Brianna Klein MD; 12/29/2015 11:02 AM) General Mental Status-Alert. General Appearance-Consistent with stated age. Hydration-Well hydrated. Voice-Normal.  Head and Neck Head-normocephalic, atraumatic with no lesions or palpable masses. Trachea-midline. Thyroid Gland Characteristics - normal size and consistency.  Eye Eyeball - Bilateral-Extraocular movements intact. Sclera/Conjunctiva - Bilateral-No scleral icterus.  Chest and Lung Exam Chest and lung exam reveals -quiet, even and easy respiratory effort with no use of accessory muscles and on auscultation, normal breath sounds, no adventitious sounds and normal vocal resonance. Inspection Chest Wall - Normal. Back - normal.  Breast Note: upper outer quadrant with palpable mass on left. Feels around 2 cm, but a little vague due to the posterior depth. No palpable LN. No nipple retraction or skin dimpling. Faint bruising at the biopsy site. right breast without abnormal findings. breasts denser laterally, but medially are not very  dense.   Cardiovascular Cardiovascular examination reveals -normal heart sounds, regular rate and rhythm with no murmurs and normal pedal pulses bilaterally.  Abdomen Inspection Inspection of the abdomen reveals - No Hernias. Palpation/Percussion Palpation and Percussion of the abdomen reveal - Soft, Non Tender, No Rebound tenderness, No Rigidity (guarding) and No hepatosplenomegaly. Auscultation Auscultation of the abdomen reveals - Bowel sounds normal.  Neurologic Neurologic evaluation reveals -alert and oriented x 3 with no impairment of recent or remote memory. Mental Status-Normal.  Musculoskeletal Global Assessment -Note: no gross deformities.  Normal Exam - Left-Upper Extremity Strength Normal and Lower Extremity Strength Normal. Normal Exam - Right-Upper Extremity Strength Normal and Lower Extremity Strength Normal.  Lymphatic Head & Neck  General Head & Neck Lymphatics: Bilateral - Description - Normal. Axillary  General Axillary Region: Bilateral - Description - Normal. Tenderness - Non Tender. Femoral & Inguinal  Generalized Femoral & Inguinal Lymphatics: Bilateral - Description - No Generalized lymphadenopathy.    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 She is agreeable to that. We discussed that if a known genetic defect was found with high penetrance of breast cancer (such as BRCA 1-2, PTEN, Chek-2 etc), that we would recommend bilateral mastectomy at some point. If she did not pursue that at this point, she would need aggressive screening.  She would like to have lumpectomy unless cancer factors at this time require mastectomy due to desire to have another child and potentially breast  feed.  I advised her that there was a good likelihood that she would require chemotherapy due to her age and race, but without the prognostic panel, I could not say definitively. I would like to see the prognostic panel in writing prior to discussing this with her again. She may want to pursue neoadjuvant chemo and go ahead  and do genetics. If this is the case, she will need MRI.  She will need referrals to medical and radiation oncology. She lives in [place], but prefers to get her care in [place]. Assuming we stick with original plan of breast conservation therapy, she will need seed localized lumpectomy due to the posterior nature of her mass and indistinct border.  I discussed risks of surgery including bleeding, infection, pain, recurrent cancer, need for chemo, dissatisfaction of scar, heart or lung complications, time off work, Social research officer, government. The patient works from home.  She understands and wishes to proceed. 60 min spent in evaluation, examination, counseling, and coordination of care. >50% spent in counseling. Current Plans Referred to Genetic Counseling, for evaluation and follow up (Medical Genetics). Routine. Referred to Oncology, for evaluation and follow up (Oncology). Routine. Referred to Radiation Oncology, for evaluation and follow up (Radiation Oncology). Routine. Pt Education - [facility] Breast Cancer Information Given - [facility] "Breast Journey" Package You are being scheduled for surgery - Our schedulers will call you.  You should hear from our office's scheduling department within 5 working days about the location, date, and time of surgery. We try to make accommodations for patient's preferences in scheduling surgery, but sometimes the OR schedule or the surgeon's schedule prevents [place] from making those accommodations.  If you have not heard from our office [phone]) in 5 working days, call the office and ask for your surgeon's nurse.  If you have other questions about your  diagnosis, plan, or surgery, call the office and ask for your surgeon's nurse.  FAMILY HISTORY OF BREAST CANCER IN FEMALE (Z80.3) Impression: refer to genetics.    Signed by Brianna Klein, MD (12/29/2015 11:12 AM)

## 2016-01-17 NOTE — Progress Notes (Signed)
Assisted Dr. Massagee with left, ultrasound guided, pectoralis block. Side rails up, monitors on throughout procedure. See vital signs in flow sheet. Tolerated Procedure well. 

## 2016-01-17 NOTE — Transfer of Care (Signed)
Immediate Anesthesia Transfer of Care Note  Patient: Brianna James  Procedure(s) Performed: Procedure(s): LEFT BREAST LUMPECTOMY WITH RADIOACTIVE SEED AND SENTINEL LYMPH NODE BIOPSY (Left) INSERTION PORT-A-CATH WITH [place] (Right)  Patient Location: PACU  Anesthesia Type:GA combined with regional for post-op pain  Level of Consciousness: awake, sedated and patient cooperative  Airway & Oxygen Therapy: Patient Spontanous Breathing and Patient connected to face mask oxygen  Post-op Assessment: Report given to RN and Post -op Vital signs reviewed and stable  Post vital signs: Reviewed and stable  Last Vitals:  Vitals:   01/17/16 1015 01/17/16 1030  BP: 121/86 (!) 117/57  Pulse: 81 (!) 106  Resp: 16 (!) 32  Temp:      Last Pain:  Vitals:   01/17/16 0948  TempSrc: Oral         Complications: No apparent anesthesia complications

## 2016-01-17 NOTE — Anesthesia Postprocedure Evaluation (Signed)
Anesthesia Post Note  Patient: Kenard Gower  Procedure(s) Performed: Procedure(s) (LRB): LEFT BREAST LUMPECTOMY WITH RADIOACTIVE SEED AND SENTINEL LYMPH NODE BIOPSY (Left) INSERTION PORT-A-CATH WITH [place] (Right)  Patient location during evaluation: PACU Anesthesia Type: General Level of consciousness: awake and alert Pain management: pain level controlled Vital Signs Assessment: post-procedure vital signs reviewed and stable Respiratory status: spontaneous breathing, nonlabored ventilation, respiratory function stable and patient connected to nasal cannula oxygen Cardiovascular status: blood pressure returned to baseline and stable Postop Assessment: no signs of nausea or vomiting Anesthetic complications: no    Last Vitals:  Vitals:   01/17/16 1445 01/17/16 1500  BP: 119/75 117/74  Pulse: 84 87  Resp: 14 14  Temp:      Last Pain:  Vitals:   01/17/16 1500  TempSrc:   PainSc: 3                  Merrie Epler,JAMES TERRILL

## 2016-01-17 NOTE — Anesthesia Procedure Notes (Signed)
Anesthesia Regional Block:  Pectoralis block  Pre-Anesthetic Checklist: ,, timeout performed, Correct Patient, Correct Site, Correct Laterality, Correct Procedure, Correct Position, site marked, Risks and benefits discussed,  Surgical consent,  Pre-op evaluation,  At surgeon's request and post-op pain management  Laterality: Left and Upper  Prep: chloraprep       Needles:      Needle Length: 9cm 9 cm   Needle insertion depth: 6 cm   Additional Needles:  Procedures: ultrasound guided (picture in chart) Pectoralis block Narrative:  Start time: 01/17/2016 10:10 AM End time: 01/17/2016 10:26 AM Injection made incrementally with aspirations every 5 mL.  Performed by: Personally  Anesthesiologist: Kayzen Kendzierski  Additional Notes: Tolerated well

## 2016-01-17 NOTE — Op Note (Signed)
Right subclavian Port placement, left Breast Lumpectomy with Sentinel Node Mapping and Biopsy Procedure Note (8Fr [employer]ue)  Indications: This patient presents with history of left breast cancer, upper outer quadrant with clinically negative axillary lymph node exam, ER/PR negative, her 2 positive.    Pre-operative Diagnosis: left breast cancer  Post-operative Diagnosis: left breast cancer  Surgeon: [name]   Assistants: n/a  Anesthesia: General LMA anesthesia and local, and pec block  ASA Class: 1  Procedure Details  The patient was seen in the Holding Room. The risks, benefits, complications, treatment options, and expected outcomes were discussed with the patient. The possibilities of reaction to medication, pulmonary aspiration, bleeding, infection, the need for additional procedures, failure to diagnose a condition, and creating a complication requiring transfusion or operation were discussed with the patient. The patient concurred with the proposed plan, giving informed consent.  The site of surgery properly noted/marked. The patient was taken to Operating Room # 1, identified as Brianna James and the procedure verified as Breast Lumpectomy and Sentinel Node Biopsy. A Time Out was held and the above information confirmed.  After induction of anesthesia, the bilateral chest and left arm were prepped and draped in standard fashion.     Local anesthetic was administered over this   area at the angle of the clavicle.  The vein was accessed with 1 pass of the needle. There was good venous return and the wire passed easily with no ectopy.   Fluoroscopy was used to confirm that the wire was in the vena cava.      The patient was placed back level and the area for the pocket was anethetized   with local anesthetic.  A 3-cm transverse incision was made with a #15   blade.  Cautery was used to divide the subcutaneous tissues down to the   pectoralis muscle.  An [employer]  retractor was used to elevate the skin   while a pocket was created on top of the pectoralis fascia.  The port   was placed into the pocket to confirm that it was of adequate size.  The   catheter was preattached to the port.  The port was then secured to the   pectoralis fascia with four 2-0 Prolene sutures.  These were clamped and   not tied down yet.    The catheter was tunneled through to the wire exit   site.  The catheter was placed along the wire to determine what length it should be to be in the SVC.  The catheter was cut at 22 cm.  The tunneler sheath and dilator were passed over the wire and the dilator and wire were removed.  The catheter was advanced through the tunneler sheath and the tunneler sheath was pulled away.  Care was taken to keep the catheter in the tunneler sheath as this occurred. This was advanced and the tunneler sheath was removed. Fluoro showed the catheter crossing midline, so the catheter was unattached and the wire was used to redirect the catheter into the SVC. The Prolene sutures were tied down to the pectoral fascia.  The skin was reapproximated using 3-0   Vicryl interrupted deep dermal sutures.    Fluoroscopy was used to re-confirm good position of the catheter.  The skin   was then closed using 4-0 Monocryl in a subcuticular fashion.  The port was flushed with concentrated heparin flush as well.  The wounds were then cleaned, dried, and dressed with Dermabond.  The lumpectomy was performed by creating an oblique incision over the upper outer quadrant of the breast over the radioactive seed.  Dissection was carried down to the pectoral fascia.  The specimen was inked with the MarginMarker paint kit.   Specimen radiography confirmed inclusion of the mammographic lesion and seed.  Hemostasis was achieved with cautery.  The wound was irrigated and closed with a 3-0 Vicryl deep dermal interrupted and a 4-0 Monocryl subcuticular closure in layers.  Using a  hand-held gamma probe, axillary sentinel nodes were identified transcutaneously.  An oblique incision was created below the axillary hairline.  Dissection was carried through the clavipectoral fascia.  2 level 2 axillary sentinel nodes were removed and submitted to pathology.  Cps were 220 and 70. The axillary incision was closed with 3-0 vicryl deep dermal interrupted sutures and 4-0 monocryl subcuticular closure in layers.      Sterile dressings were applied. At the end of the operation, all sponge, instrument, and needle counts were correct.  Findings: grossly clear surgical margins.  Posterior margin is pectoralis, lateral and anterior margin is skin, superolateral margin is axilla.    Estimated Blood Loss:  Minimal         Specimens: left lumpectomy and two left axillary sentinel nodes                Complications:  None; patient tolerated the procedure well.         Disposition: PACU - hemodynamically stable.         Condition: stable      

## 2016-01-17 NOTE — Anesthesia Preprocedure Evaluation (Signed)
Anesthesia Evaluation  Patient identified by MRN, date of birth, ID band Patient awake    Reviewed: Allergy & Precautions, NPO status , Patient's Chart, lab work & pertinent test results  History of Anesthesia Complications Negative for: history of anesthetic complications  Airway Mallampati: I  TM Distance: >3 FB Neck ROM: Full    Dental  (+) Teeth Intact   Pulmonary neg pulmonary ROS,    breath sounds clear to auscultation       Cardiovascular  Rhythm:Regular Rate:Normal     Neuro/Psych negative neurological ROS     GI/Hepatic negative GI ROS, Neg liver ROS,   Endo/Other  negative endocrine ROS  Renal/GU negative Renal ROS     Musculoskeletal   Abdominal   Peds  Hematology negative hematology ROS (+)   Anesthesia Other Findings   Reproductive/Obstetrics                             Anesthesia Physical Anesthesia Plan  ASA: I  Anesthesia Plan: General   Post-op Pain Management: GA combined w/ Regional for post-op pain   Induction:   Airway Management Planned: LMA  Additional Equipment:   Intra-op Plan:   Post-operative Plan: Extubation in OR  Informed Consent: I have reviewed the patients History and Physical, chart, labs and discussed the procedure including the risks, benefits and alternatives for the proposed anesthesia with the patient or authorized representative who has indicated his/her understanding and acceptance.   Dental advisory given  Plan Discussed with:   Anesthesia Plan Comments:         Anesthesia Quick Evaluation

## 2016-01-17 NOTE — Interval H&P Note (Signed)
History and Physical Interval Note:  01/17/2016 10:28 AM  Brianna James L Kreuser  has presented today for surgery, with the diagnosis of LEFT BREAST CANCER  The various methods of treatment have been discussed with the patient and family. After consideration of risks, benefits and other options for treatment, the patient has consented to  Procedure(s): LEFT BREAST LUMPECTOMY WITH RADIOACTIVE SEED AND SENTINEL LYMPH NODE BIOPSY (Left) INSERTION PORT-A-CATH WITH [place] (N/A) as a surgical intervention .  The patient's history has been reviewed, patient examined, no change in status, stable for surgery.  I have reviewed the patient's chart and labs.  Questions were answered to the patient's satisfaction.     Manu Rubey

## 2016-01-17 NOTE — Discharge Instructions (Addendum)
[facility] °Office Phone Number [phone] ° °BREAST BIOPSY/ PARTIAL MASTECTOMY: POST OP INSTRUCTIONS ° °Always review your discharge instruction sheet given to you by the facility where your surgery was performed. ° °IF YOU HAVE DISABILITY OR FAMILY LEAVE FORMS, YOU MUST BRING THEM TO THE OFFICE FOR PROCESSING.  DO NOT GIVE THEM TO YOUR DOCTOR. ° °1. A prescription for pain medication may be given to you upon discharge.  Take your pain medication as prescribed, if needed.  If narcotic pain medicine is not needed, then you may take acetaminophen (Tylenol) or ibuprofen (Advil) as needed. °2. Take your usually prescribed medications unless otherwise directed °3. If you need a refill on your pain medication, please contact your pharmacy.  They will contact our office to request authorization.  Prescriptions will not be filled after 5pm or on week-ends. °4. You should eat very light the first 24 hours after surgery, such as soup, crackers, pudding, etc.  Resume your normal diet the day after surgery. °5. Most patients will experience some swelling and bruising in the breast.  Ice packs and a good support bra will help.  Swelling and bruising can take several days to resolve.  °6. It is common to experience some constipation if taking pain medication after surgery.  Increasing fluid intake and taking a stool softener will usually help or prevent this problem from occurring.  A mild laxative (Milk of Magnesia or Miralax) should be taken according to package directions if there are no bowel movements after 48 hours. °7. Unless discharge instructions indicate otherwise, you may remove your bandages 48 hours after surgery, and you may shower at that time.  You may have steri-strips (small skin tapes) in place directly over the incision.  These strips should be left on the skin for 7-10 days.   Any sutures or staples will be removed at the office during your follow-up visit. °8. ACTIVITIES:  You may resume  regular daily activities (gradually increasing) beginning the next day.  Wearing a good support bra or sports bra (or the breast binder) minimizes pain and swelling.  You may have sexual intercourse when it is comfortable. °a. You may drive when you no longer are taking prescription pain medication, you can comfortably wear a seatbelt, and you can safely maneuver your car and apply brakes. °b. RETURN TO WORK:  __________1 week_______________ °9. You should see your doctor in the office for a follow-up appointment approximately two weeks after your surgery.  Your doctor’s nurse will typically make your follow-up appointment when she calls you with your pathology report.  Expect your pathology report 2-3 business days after your surgery.  You may call to check if you do not hear from us after three days. ° ° °WHEN TO CALL YOUR DOCTOR: °1. Fever over 101.0 °2. Nausea and/or vomiting. °3. Extreme swelling or bruising. °4. Continued bleeding from incision. °5. Increased pain, redness, or drainage from the incision. ° °The clinic staff is available to answer your questions during regular business hours.  Please don’t hesitate to call and ask to speak to one of the nurses for clinical concerns.  If you have a medical emergency, go to the nearest emergency room or call 911.  A surgeon from [facility] is always on call at the hospital. ° °For further questions, please visit [email or web address]  ° ° °Post Anesthesia Home Care Instructions ° °Activity: °Get plenty of rest for the remainder of the day. A responsible adult should stay with you for 24   hours following the procedure.  °For the next 24 hours, DO NOT: °-Drive a car °-Operate machinery °-Drink alcoholic beverages °-Take any medication unless instructed by your physician °-Make any legal decisions or sign important papers. ° °Meals: °Start with liquid foods such as gelatin or soup. Progress to regular foods as tolerated. Avoid greasy, spicy, heavy  foods. If nausea and/or vomiting occur, drink only clear liquids until the nausea and/or vomiting subsides. Call your physician if vomiting continues. ° °Special Instructions/Symptoms: °Your throat may feel dry or sore from the anesthesia or the breathing tube placed in your throat during surgery. If this causes discomfort, gargle with warm salt water. The discomfort should disappear within 24 hours. ° °If you had a scopolamine patch placed behind your ear for the management of post- operative nausea and/or vomiting: ° °1. The medication in the patch is effective for 72 hours, after which it should be removed.  Wrap patch in a tissue and discard in the trash. Wash hands thoroughly with soap and water. °2. You may remove the patch earlier than 72 hours if you experience unpleasant side effects which may include dry mouth, dizziness or visual disturbances. °3. Avoid touching the patch. Wash your hands with soap and water after contact with the patch. °  ° °

## 2016-01-17 NOTE — Anesthesia Procedure Notes (Signed)
Procedure Name: LMA Insertion Date/Time: 01/17/2016 11:10 AM Performed by: Lyndee Leo Pre-anesthesia Checklist: Patient identified, Emergency Drugs available, Suction available and Patient being monitored Patient Re-evaluated:Patient Re-evaluated prior to inductionOxygen Delivery Method: Circle system utilized Preoxygenation: Pre-oxygenation with 100% oxygen Intubation Type: IV induction Ventilation: Mask ventilation without difficulty LMA: LMA inserted LMA Size: 4.0 Number of attempts: 1 Airway Equipment and Method: Bite block Placement Confirmation: positive ETCO2 Tube secured with: Tape Dental Injury: Teeth and Oropharynx as per pre-operative assessment

## 2016-01-18 ENCOUNTER — Encounter (HOSPITAL_BASED_OUTPATIENT_CLINIC_OR_DEPARTMENT_OTHER): Payer: Self-pay | Admitting: General Surgery

## 2016-01-19 ENCOUNTER — Ambulatory Visit (HOSPITAL_COMMUNITY): Admission: RE | Admit: 2016-01-19 | Payer: 59 | Source: Ambulatory Visit | Admitting: Internal Medicine

## 2016-01-19 ENCOUNTER — Other Ambulatory Visit (HOSPITAL_COMMUNITY): Payer: 59

## 2016-01-19 ENCOUNTER — Ambulatory Visit (HOSPITAL_COMMUNITY)
Admission: RE | Admit: 2016-01-19 | Discharge: 2016-01-19 | Disposition: A | Discharge status: Home / Self Care | Payer: 59 | Source: Ambulatory Visit | Attending: Internal Medicine | Admitting: Internal Medicine

## 2016-01-19 ENCOUNTER — Encounter (HOSPITAL_COMMUNITY): Payer: Self-pay

## 2016-01-19 DIAGNOSIS — C50412 Malignant neoplasm of upper-outer quadrant of left female breast: Secondary | ICD-10-CM

## 2016-01-19 DIAGNOSIS — Z171 Estrogen receptor negative status [ER-]: Principal | ICD-10-CM

## 2016-01-23 ENCOUNTER — Other Ambulatory Visit: Payer: Self-pay | Admitting: General Surgery

## 2016-01-24 ENCOUNTER — Encounter (HOSPITAL_BASED_OUTPATIENT_CLINIC_OR_DEPARTMENT_OTHER): Payer: Self-pay | Admitting: *Deleted

## 2016-01-24 NOTE — Progress Notes (Signed)
Pt instructed npo pmn tonight.  To [facility] 11/9 @ 0730.  Needs hgb , urine hcg on arrival.  Pt aware to do hibiclens shower tonight and in am pta.

## 2016-01-25 ENCOUNTER — Ambulatory Visit (HOSPITAL_BASED_OUTPATIENT_CLINIC_OR_DEPARTMENT_OTHER): Payer: 59 | Admitting: Anesthesiology

## 2016-01-25 ENCOUNTER — Ambulatory Visit (HOSPITAL_BASED_OUTPATIENT_CLINIC_OR_DEPARTMENT_OTHER)
Admission: RE | Admit: 2016-01-25 | Discharge: 2016-01-25 | Disposition: A | Discharge status: Home / Self Care | Payer: 59 | Source: Ambulatory Visit | Attending: General Surgery | Admitting: General Surgery

## 2016-01-25 ENCOUNTER — Encounter (HOSPITAL_BASED_OUTPATIENT_CLINIC_OR_DEPARTMENT_OTHER): Payer: Self-pay | Admitting: *Deleted

## 2016-01-25 ENCOUNTER — Encounter (HOSPITAL_BASED_OUTPATIENT_CLINIC_OR_DEPARTMENT_OTHER)
Admission: RE | Disposition: A | Discharge status: Home / Self Care | Payer: Self-pay | Source: Ambulatory Visit | Attending: General Surgery

## 2016-01-25 DIAGNOSIS — Z823 Family history of stroke: Secondary | ICD-10-CM | POA: Diagnosis not present

## 2016-01-25 DIAGNOSIS — Y828 Other medical devices associated with adverse incidents: Secondary | ICD-10-CM | POA: Diagnosis not present

## 2016-01-25 DIAGNOSIS — Z833 Family history of diabetes mellitus: Secondary | ICD-10-CM | POA: Diagnosis not present

## 2016-01-25 DIAGNOSIS — Y838 Other surgical procedures as the cause of abnormal reaction of the patient, or of later complication, without mention of misadventure at the time of the procedure: Secondary | ICD-10-CM | POA: Diagnosis not present

## 2016-01-25 DIAGNOSIS — Z8049 Family history of malignant neoplasm of other genital organs: Secondary | ICD-10-CM | POA: Diagnosis not present

## 2016-01-25 DIAGNOSIS — Z803 Family history of malignant neoplasm of breast: Secondary | ICD-10-CM | POA: Insufficient documentation

## 2016-01-25 DIAGNOSIS — Z836 Family history of other diseases of the respiratory system: Secondary | ICD-10-CM | POA: Diagnosis not present

## 2016-01-25 DIAGNOSIS — L7632 Postprocedural hematoma of skin and subcutaneous tissue following other procedure: Secondary | ICD-10-CM | POA: Diagnosis not present

## 2016-01-25 HISTORY — DX: Other injury of unspecified body region, initial encounter: T14.8XXA

## 2016-01-25 HISTORY — DX: Other specified postprocedural states: R11.2

## 2016-01-25 HISTORY — DX: Other specified postprocedural states: Z98.890

## 2016-01-25 HISTORY — PX: EVACUATION BREAST HEMATOMA: SHX1537

## 2016-01-25 LAB — POCT HEMOGLOBIN-HEMACUE: Hemoglobin: 10.2 g/dL — ABNORMAL LOW (ref 12.0–15.0)

## 2016-01-25 LAB — POCT PREGNANCY, URINE: PREG TEST UR: NEGATIVE

## 2016-01-25 SURGERY — EVACUATION, HEMATOMA, BREAST
Anesthesia: General | Site: Breast | Laterality: Left

## 2016-01-25 MED ORDER — MIDAZOLAM HCL 2 MG/2ML IJ SOLN
INTRAMUSCULAR | Status: AC
  Filled 2016-01-25: qty 2

## 2016-01-25 MED ORDER — ACETAMINOPHEN 650 MG RE SUPP
650.0000 mg | RECTAL | Status: DC | PRN
  Filled 2016-01-25: qty 1

## 2016-01-25 MED ORDER — LIDOCAINE-EPINEPHRINE (PF) 1 %-1:200000 IJ SOLN
INTRAMUSCULAR | Status: AC
  Filled 2016-01-25: qty 30

## 2016-01-25 MED ORDER — LIDOCAINE 2% (20 MG/ML) 5 ML SYRINGE
INTRAMUSCULAR | Status: DC | PRN
  Administered 2016-01-25: 100 mg via INTRAVENOUS

## 2016-01-25 MED ORDER — FENTANYL CITRATE (PF) 100 MCG/2ML IJ SOLN
INTRAMUSCULAR | Status: AC
  Filled 2016-01-25: qty 2

## 2016-01-25 MED ORDER — ACETAMINOPHEN 325 MG PO TABS
650.0000 mg | ORAL_TABLET | ORAL | Status: DC | PRN
  Filled 2016-01-25: qty 2

## 2016-01-25 MED ORDER — LACTATED RINGERS IV SOLN
INTRAVENOUS | Status: DC
  Administered 2016-01-25 (×3): via INTRAVENOUS
  Filled 2016-01-25: qty 1000

## 2016-01-25 MED ORDER — SODIUM CHLORIDE 0.9% FLUSH
3.0000 mL | Freq: Two times a day (BID) | INTRAVENOUS | Status: DC
  Filled 2016-01-25: qty 3

## 2016-01-25 MED ORDER — CHLORHEXIDINE GLUCONATE CLOTH 2 % EX PADS
6.0000 | MEDICATED_PAD | Freq: Once | CUTANEOUS | Status: DC
  Filled 2016-01-25: qty 6

## 2016-01-25 MED ORDER — PROPOFOL 10 MG/ML IV BOLUS
INTRAVENOUS | Status: DC | PRN
  Administered 2016-01-25: 200 mg via INTRAVENOUS

## 2016-01-25 MED ORDER — BUPIVACAINE HCL 0.25 % IJ SOLN
INTRAMUSCULAR | Status: DC | PRN
  Administered 2016-01-25: 20 mL

## 2016-01-25 MED ORDER — BUPIVACAINE HCL (PF) 0.25 % IJ SOLN
INTRAMUSCULAR | Status: AC
  Filled 2016-01-25: qty 30

## 2016-01-25 MED ORDER — PROPOFOL 10 MG/ML IV BOLUS
INTRAVENOUS | Status: AC
  Filled 2016-01-25: qty 40

## 2016-01-25 MED ORDER — HYDROCODONE-ACETAMINOPHEN 5-325 MG PO TABS
1.0000 | ORAL_TABLET | ORAL | Status: DC | PRN
  Administered 2016-01-25: 1 via ORAL
  Filled 2016-01-25: qty 2

## 2016-01-25 MED ORDER — SCOPOLAMINE 1 MG/3DAYS TD PT72
MEDICATED_PATCH | TRANSDERMAL | Status: DC | PRN
  Administered 2016-01-25: 1 via TRANSDERMAL

## 2016-01-25 MED ORDER — SCOPOLAMINE 1 MG/3DAYS TD PT72
MEDICATED_PATCH | TRANSDERMAL | Status: AC
  Filled 2016-01-25: qty 1

## 2016-01-25 MED ORDER — CEFAZOLIN SODIUM-DEXTROSE 2-4 GM/100ML-% IV SOLN
INTRAVENOUS | Status: AC
  Filled 2016-01-25: qty 100

## 2016-01-25 MED ORDER — SODIUM CHLORIDE 0.9 % IV SOLN
250.0000 mL | INTRAVENOUS | Status: DC | PRN
  Filled 2016-01-25: qty 250

## 2016-01-25 MED ORDER — DEXAMETHASONE SODIUM PHOSPHATE 10 MG/ML IJ SOLN
INTRAMUSCULAR | Status: AC
  Filled 2016-01-25: qty 1

## 2016-01-25 MED ORDER — ONDANSETRON HCL 4 MG/2ML IJ SOLN
INTRAMUSCULAR | Status: AC
  Filled 2016-01-25: qty 2

## 2016-01-25 MED ORDER — SODIUM CHLORIDE 0.9% FLUSH
3.0000 mL | INTRAVENOUS | Status: DC | PRN
  Filled 2016-01-25: qty 3

## 2016-01-25 MED ORDER — LIDOCAINE 2% (20 MG/ML) 5 ML SYRINGE
INTRAMUSCULAR | Status: AC
  Filled 2016-01-25: qty 5

## 2016-01-25 MED ORDER — HYDROCODONE-ACETAMINOPHEN 5-325 MG PO TABS
ORAL_TABLET | ORAL | Status: AC
  Filled 2016-01-25: qty 1

## 2016-01-25 MED ORDER — PHENYLEPHRINE HCL 10 MG/ML IJ SOLN
INTRAMUSCULAR | Status: DC | PRN
  Administered 2016-01-25 (×3): 80 ug via INTRAVENOUS

## 2016-01-25 MED ORDER — OXYCODONE HCL 5 MG PO TABS
5.0000 mg | ORAL_TABLET | ORAL | Status: DC | PRN
  Filled 2016-01-25: qty 2

## 2016-01-25 MED ORDER — MIDAZOLAM HCL 5 MG/5ML IJ SOLN
INTRAMUSCULAR | Status: DC | PRN
  Administered 2016-01-25: 2 mg via INTRAVENOUS

## 2016-01-25 MED ORDER — DEXAMETHASONE SODIUM PHOSPHATE 4 MG/ML IJ SOLN
INTRAMUSCULAR | Status: DC | PRN
  Administered 2016-01-25: 10 mg via INTRAVENOUS

## 2016-01-25 MED ORDER — FENTANYL CITRATE (PF) 100 MCG/2ML IJ SOLN
25.0000 ug | INTRAMUSCULAR | Status: DC | PRN
  Administered 2016-01-25: 25 ug via INTRAVENOUS
  Filled 2016-01-25: qty 1

## 2016-01-25 MED ORDER — CEFAZOLIN SODIUM-DEXTROSE 2-4 GM/100ML-% IV SOLN
2.0000 g | INTRAVENOUS | Status: AC
  Administered 2016-01-25: 2 g via INTRAVENOUS
  Filled 2016-01-25: qty 100

## 2016-01-25 MED ORDER — FENTANYL CITRATE (PF) 100 MCG/2ML IJ SOLN
INTRAMUSCULAR | Status: DC | PRN
  Administered 2016-01-25: 50 ug via INTRAVENOUS
  Administered 2016-01-25 (×2): 25 ug via INTRAVENOUS

## 2016-01-25 MED ORDER — ONDANSETRON HCL 4 MG/2ML IJ SOLN
INTRAMUSCULAR | Status: DC | PRN
  Administered 2016-01-25: 4 mg via INTRAVENOUS

## 2016-01-25 MED ORDER — HYDROCODONE-ACETAMINOPHEN 5-325 MG PO TABS: 1.0000 | ORAL_TABLET | ORAL | 0 refills | Status: DC | PRN

## 2016-01-25 SURGICAL SUPPLY — 48 items
BLADE HEX COATED 2.75 (ELECTRODE) ×3 IMPLANT
BLADE SURG 15 STRL LF DISP TIS (BLADE) ×1 IMPLANT
BLADE SURG 15 STRL SS (BLADE) ×2
CANISTER SUCTION 1200CC (MISCELLANEOUS) ×3 IMPLANT
CHLORAPREP W/TINT 26ML (MISCELLANEOUS) ×3 IMPLANT
CLIP TI WIDE RED SMALL 6 (CLIP) IMPLANT
CLOSURE WOUND 1/2 X4 (GAUZE/BANDAGES/DRESSINGS) ×1
CLOTH BEACON ORANGE TIMEOUT ST (SAFETY) ×3 IMPLANT
COVER BACK TABLE 60X90IN (DRAPES) ×3 IMPLANT
COVER MAYO STAND STRL (DRAPES) ×3 IMPLANT
DEVICE DUBIN W/COMP PLATE 8390 (MISCELLANEOUS) IMPLANT
DRAPE LAPAROTOMY 100X72 PEDS (DRAPES) ×3 IMPLANT
DRAPE UTILITY XL STRL (DRAPES) ×3 IMPLANT
ELECT REM PT RETURN 9FT ADLT (ELECTROSURGICAL) ×3
ELECTRODE REM PT RTRN 9FT ADLT (ELECTROSURGICAL) ×1 IMPLANT
GLOVE BIO SURGEON STRL SZ 6 (GLOVE) ×3 IMPLANT
GLOVE BIOGEL PI IND STRL 6.5 (GLOVE) ×1 IMPLANT
GLOVE BIOGEL PI INDICATOR 6.5 (GLOVE) ×2
GOWN STRL REUS W/ TWL XL LVL3 (GOWN DISPOSABLE) ×1 IMPLANT
GOWN STRL REUS W/TWL 2XL LVL3 (GOWN DISPOSABLE) ×3 IMPLANT
GOWN STRL REUS W/TWL XL LVL3 (GOWN DISPOSABLE) ×2
KIT MARKER MARGIN INK (KITS) IMPLANT
KIT ROOM TURNOVER WOR (KITS) ×3 IMPLANT
NEEDLE HYPO 25X1 1.5 SAFETY (NEEDLE) ×3 IMPLANT
NS IRRIG 500ML POUR BTL (IV SOLUTION) ×9 IMPLANT
PACK BASIN DAY SURGERY FS (CUSTOM PROCEDURE TRAY) ×3 IMPLANT
PENCIL BUTTON HOLSTER BLD 10FT (ELECTRODE) ×3 IMPLANT
SLEEVE SCD COMPRESS KNEE MED (MISCELLANEOUS) ×3 IMPLANT
SPONGE GAUZE 4X4 12PLY STER LF (GAUZE/BANDAGES/DRESSINGS) ×3 IMPLANT
SPONGE LAP 18X18 X RAY DECT (DISPOSABLE) ×6 IMPLANT
STAPLER VISISTAT 35W (STAPLE) IMPLANT
STRIP CLOSURE SKIN 1/2X4 (GAUZE/BANDAGES/DRESSINGS) ×2 IMPLANT
SUT MON AB 4-0 PC3 18 (SUTURE) ×3 IMPLANT
SUT SILK 2 0 SH (SUTURE) IMPLANT
SUT VIC AB 3-0 54X BRD REEL (SUTURE) IMPLANT
SUT VIC AB 3-0 BRD 54 (SUTURE)
SUT VIC AB 3-0 SH 27 (SUTURE) ×2
SUT VIC AB 3-0 SH 27X BRD (SUTURE) ×1 IMPLANT
SYR BULB 3OZ (MISCELLANEOUS) ×3 IMPLANT
SYR CONTROL 10ML LL (SYRINGE) ×3 IMPLANT
TAPE HYPAFIX 6 X30' (GAUZE/BANDAGES/DRESSINGS) ×1
TAPE HYPAFIX 6X30 (GAUZE/BANDAGES/DRESSINGS) ×2 IMPLANT
TOWEL OR 17X24 6PK STRL BLUE (TOWEL DISPOSABLE) ×6 IMPLANT
TOWEL OR NON WOVEN STRL DISP B (DISPOSABLE) IMPLANT
TUBE CONNECTING 12'X1/4 (SUCTIONS) ×1
TUBE CONNECTING 12X1/4 (SUCTIONS) ×2 IMPLANT
WATER STERILE IRR 500ML POUR (IV SOLUTION) IMPLANT
YANKAUER SUCT BULB TIP NO VENT (SUCTIONS) ×3 IMPLANT

## 2016-01-25 NOTE — Anesthesia Postprocedure Evaluation (Signed)
Anesthesia Post Note  Patient: Brianna James  Procedure(s) Performed: Procedure(s) (LRB): WASHOUT LEFT BREAST HEMATOMA (Left)  Patient location during evaluation: PACU Anesthesia Type: General Level of consciousness: awake and alert Pain management: pain level controlled Vital Signs Assessment: post-procedure vital signs reviewed and stable Respiratory status: spontaneous breathing, nonlabored ventilation, respiratory function stable and patient connected to nasal cannula oxygen Cardiovascular status: blood pressure returned to baseline and stable Postop Assessment: no signs of nausea or vomiting Anesthetic complications: no    Last Vitals:  Vitals:   01/25/16 1030 01/25/16 1045  BP: 102/66 104/61  Pulse: 89 83  Resp: 16 14  Temp: 36.7 C     Last Pain:  Vitals:   01/25/16 0748  TempSrc: Oral                 Catalina Gravel

## 2016-01-25 NOTE — Anesthesia Preprocedure Evaluation (Addendum)
Anesthesia Evaluation  Patient identified by MRN, date of birth, ID band Patient awake    Reviewed: Allergy & Precautions, NPO status , Patient's Chart, lab work & pertinent test results  History of Anesthesia Complications (+) PONV and history of anesthetic complications  Airway Mallampati: I  TM Distance: >3 FB Neck ROM: Full    Dental  (+) Teeth Intact, Dental Advisory Given   Pulmonary neg pulmonary ROS,    Pulmonary exam normal breath sounds clear to auscultation       Cardiovascular Exercise Tolerance: Good negative cardio ROS Normal cardiovascular exam Rhythm:Regular Rate:Normal     Neuro/Psych Anxiety negative neurological ROS  negative psych ROS   GI/Hepatic negative GI ROS, Neg liver ROS,   Endo/Other  negative endocrine ROSObesity   Renal/GU negative Renal ROS     Musculoskeletal negative musculoskeletal ROS (+)   Abdominal   Peds  Hematology negative hematology ROS (+)   Anesthesia Other Findings Day of surgery medications reviewed with the patient.  Breast cancer s/p lumpectomy  Reproductive/Obstetrics                                                            Anesthesia Evaluation  Patient identified by MRN, date of birth, ID band Patient awake    Reviewed: Allergy & Precautions, NPO status , Patient's Chart, lab work & pertinent test results  History of Anesthesia Complications Negative for: history of anesthetic complications  Airway Mallampati: I  TM Distance: >3 FB Neck ROM: Full    Dental  (+) Teeth Intact   Pulmonary neg pulmonary ROS,    breath sounds clear to auscultation       Cardiovascular  Rhythm:Regular Rate:Normal     Neuro/Psych negative neurological ROS     GI/Hepatic negative GI ROS, Neg liver ROS,   Endo/Other  negative endocrine ROS  Renal/GU negative Renal ROS     Musculoskeletal   Abdominal   Peds  Hematology negative hematology ROS (+)   Anesthesia Other Findings   Reproductive/Obstetrics                             Anesthesia Physical Anesthesia Plan  ASA: I  Anesthesia Plan: General   Post-op Pain Management: GA combined w/ Regional for post-op pain   Induction:   Airway Management Planned: LMA  Additional Equipment:   Intra-op Plan:   Post-operative Plan: Extubation in OR  Informed Consent: I have reviewed the patients History and Physical, chart, labs and discussed the procedure including the risks, benefits and alternatives for the proposed anesthesia with the patient or authorized representative who has indicated his/her understanding and acceptance.   Dental advisory given  Plan Discussed with:   Anesthesia Plan Comments:         Anesthesia Quick Evaluation  Anesthesia Physical Anesthesia Plan  ASA: II  Anesthesia Plan: General   Post-op Pain Management:    Induction: Intravenous  Airway Management Planned: LMA  Additional Equipment:   Intra-op Plan:   Post-operative Plan: Extubation in OR  Informed Consent: I have reviewed the patients History and Physical, chart, labs and discussed the procedure including the risks, benefits and alternatives for the proposed anesthesia with the patient or authorized representative who has indicated his/her understanding and acceptance.  Dental advisory given  Plan Discussed with: CRNA and Anesthesiologist  Anesthesia Plan Comments: (Risks/benefits of general anesthesia discussed with patient including risk of damage to teeth, lips, gum, and tongue, nausea/vomiting, allergic reactions to medications, and the possibility of heart attack, stroke and death.  All patient questions answered.  Patient wishes to proceed.)       Anesthesia Quick Evaluation

## 2016-01-25 NOTE — H&P (View-Only) (Signed)
Brianna James 12/28/2015 9:46 AM Location: [facility] Patient #: [id number] DOB: 01/12/1981 Married / Language: English / Race: Black or African American Female   History of Present Illness Brianna Klein MD; 12/29/2015 11:00 AM) Patient words: Left breast cancer.  The patient is a 35 year old female who presents with breast cancer. Pt is a 35 yo F referred by Dr. Isaiah Blakes for consultation regarding a new diagnosis of left breast cancer. The patient presented with a palpable mass in the upper outer quadrant of the left breast. She says she was horseplaying with her 24 year old son and he struck her in the breast. When it was sore, she rubbed it and it felt like there was something there. She got diagnostic imaging performed and a 1.6 cm irregular oval mass was identified in the posterior depth of the upper outer quadrant on the left. Core needle biopsy was performed and the path showed invasive ductal carcinoma, grade 3 wtih high grade DCIS. Prognostic panel not available at the time of discussion with the patient.   Prognostic panel is not available in print at the time of this report, but verbally is ER/PR negative, Her 2 positive, with Ki 67 at 40%.   Images of mammo/ultrasound as well as reports were reviewed.    Other Problems Brianna James, [place]; 12/28/2015 9:47 AM) No pertinent past medical history  Past Surgical History Brianna James, [place]; 12/28/2015 9:47 AM) Breast Biopsy Left.  Diagnostic Studies History Brianna James, [place]; 12/28/2015 9:47 AM) Colonoscopy never Mammogram within last year Pap Smear 1-5 years ago  Allergies Brianna James, [place]; 12/28/2015 9:47 AM) Peanuts  Medication History Brianna James, [place]; 12/28/2015 9:47 AM) No Current Medications Medications Reconciled  Social History Brianna James, [place]; 12/28/2015 9:47 AM) Alcohol use Occasional alcohol use. Caffeine use Carbonated beverages, Coffee, Tea. No drug use Tobacco  use Never smoker.  Family History Brianna James, [place]; 12/28/2015 9:47 AM) Breast Cancer Family Members In General. Cerebrovascular Accident Family Members In General, Mother. Cervical Cancer Family Members In General. Diabetes Mellitus Family Members In General. Hypertension Family Members In General. Ovarian Cancer Family Members In General. Respiratory Condition Family Members In General.  Pregnancy / Birth History Brianna James, [place]; 12/28/2015 9:47 AM) Age at menarche 1 years. Contraceptive History Oral contraceptives. Gravida 1 Length (months) of breastfeeding 3-6 Maternal age 41-35 Para 1 Regular periods    Review of Systems Brianna James CMA; 12/28/2015 9:47 AM) General Present- Fatigue and Weight Gain. Not Present- Appetite Loss, Chills, Fever, Night Sweats and Weight Loss. Skin Present- Hives. Not Present- Change in Wart/Mole, Dryness, Jaundice, New Lesions, Non-Healing Wounds, Rash and Ulcer. HEENT Present- Seasonal Allergies. Not Present- Earache, Hearing Loss, Hoarseness, Nose Bleed, Oral Ulcers, Ringing in the Ears, Sinus Pain, Sore Throat, Visual Disturbances, Wears glasses/contact lenses and Yellow Eyes. Respiratory Not Present- Bloody sputum, Chronic Cough, Difficulty Breathing, Snoring and Wheezing. Breast Present- Breast Mass and Breast Pain. Not Present- Nipple Discharge and Skin Changes. Cardiovascular Present- Chest Pain. Not Present- Difficulty Breathing Lying Down, Leg Cramps, Palpitations, Rapid Heart Rate, Shortness of Breath and Swelling of Extremities. Gastrointestinal Present- Bloating. Not Present- Abdominal Pain, Bloody Stool, Change in Bowel Habits, Chronic diarrhea, Constipation, Difficulty Swallowing, Excessive gas, Gets full quickly at meals, Hemorrhoids, Indigestion, Nausea, Rectal Pain and Vomiting. Female Genitourinary Not Present- Frequency, Nocturia, Painful Urination, Pelvic Pain and Urgency. Musculoskeletal Not Present- Back  Pain, Joint Pain, Joint Stiffness, Muscle Pain, Muscle Weakness and Swelling of Extremities. Neurological Not Present- Decreased Memory, Fainting,  Headaches, Numbness, Seizures, Tingling, Tremor, Trouble walking and Weakness. Psychiatric Present- Frequent crying. Not Present- Anxiety, Bipolar, Change in Sleep Pattern, Depression and Fearful. Endocrine Not Present- Cold Intolerance, Excessive Hunger, Hair Changes, Heat Intolerance, Hot flashes and New Diabetes. Hematology Present- Easy Bruising. Not Present- Blood Thinners, Excessive bleeding, Gland problems, HIV and Persistent Infections.  Vitals Brianna James CMA; 12/28/2015 9:48 AM) 12/28/2015 9:47 AM Weight: 218.2 lb Height: 67in Height was reported by patient. Body Surface Area: 2.1 m Body Mass Index: 34.17 kg/m  Temp.: 98.41F  Pulse: 74 (Regular)  BP: 118/72 (Sitting, Left Arm, Standard)       Physical Exam Brianna Klein MD; 12/29/2015 11:02 AM) General Mental Status-Alert. General Appearance-Consistent with stated age. Hydration-Well hydrated. Voice-Normal.  Head and Neck Head-normocephalic, atraumatic with no lesions or palpable masses. Trachea-midline. Thyroid Gland Characteristics - normal size and consistency.  Eye Eyeball - Bilateral-Extraocular movements intact. Sclera/Conjunctiva - Bilateral-No scleral icterus.  Chest and Lung Exam Chest and lung exam reveals -quiet, even and easy respiratory effort with no use of accessory muscles and on auscultation, normal breath sounds, no adventitious sounds and normal vocal resonance. Inspection Chest Wall - Normal. Back - normal.  Breast Note: upper outer quadrant with palpable mass on left. Feels around 2 cm, but a little vague due to the posterior depth. No palpable LN. No nipple retraction or skin dimpling. Faint bruising at the biopsy site. right breast without abnormal findings. breasts denser laterally, but medially are not very  dense.   Cardiovascular Cardiovascular examination reveals -normal heart sounds, regular rate and rhythm with no murmurs and normal pedal pulses bilaterally.  Abdomen Inspection Inspection of the abdomen reveals - No Hernias. Palpation/Percussion Palpation and Percussion of the abdomen reveal - Soft, Non Tender, No Rebound tenderness, No Rigidity (guarding) and No hepatosplenomegaly. Auscultation Auscultation of the abdomen reveals - Bowel sounds normal.  Neurologic Neurologic evaluation reveals -alert and oriented x 3 with no impairment of recent or remote memory. Mental Status-Normal.  Musculoskeletal Global Assessment -Note: no gross deformities.  Normal Exam - Left-Upper Extremity Strength Normal and Lower Extremity Strength Normal. Normal Exam - Right-Upper Extremity Strength Normal and Lower Extremity Strength Normal.  Lymphatic Head & Neck  General Head & Neck Lymphatics: Bilateral - Description - Normal. Axillary  General Axillary Region: Bilateral - Description - Normal. Tenderness - Non Tender. Femoral & Inguinal  Generalized Femoral & Inguinal Lymphatics: Bilateral - Description - No Generalized lymphadenopathy.    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 She is agreeable to that. We discussed that if a known genetic defect was found with high penetrance of breast cancer (such as BRCA 1-2, PTEN, Chek-2 etc), that we would recommend bilateral mastectomy at some point. If she did not pursue that at this point, she would need aggressive screening.  She would like to have lumpectomy unless cancer factors at this time require mastectomy due to desire to have another child and potentially breast  feed.  I advised her that there was a good likelihood that she would require chemotherapy due to her age and race, but without the prognostic panel, I could not say definitively. I would like to see the prognostic panel in writing prior to discussing this with her again. She may want to pursue neoadjuvant chemo and go ahead  and do genetics. If this is the case, she will need MRI.  She will need referrals to medical and radiation oncology. She lives in [place], but prefers to get her care in [place]. Assuming we stick with original plan of breast conservation therapy, she will need seed localized lumpectomy due to the posterior nature of her mass and indistinct border.  I discussed risks of surgery including bleeding, infection, pain, recurrent cancer, need for chemo, dissatisfaction of scar, heart or lung complications, time off work, Social research officer, government. The patient works from home.  She understands and wishes to proceed. 60 min spent in evaluation, examination, counseling, and coordination of care. >50% spent in counseling. Current Plans Referred to Genetic Counseling, for evaluation and follow up (Medical Genetics). Routine. Referred to Oncology, for evaluation and follow up (Oncology). Routine. Referred to Radiation Oncology, for evaluation and follow up (Radiation Oncology). Routine. Pt Education - [facility] Breast Cancer Information Given - [facility] "Breast Journey" Package You are being scheduled for surgery - Our schedulers will call you.  You should hear from our office's scheduling department within 5 working days about the location, date, and time of surgery. We try to make accommodations for patient's preferences in scheduling surgery, but sometimes the OR schedule or the surgeon's schedule prevents [place] from making those accommodations.  If you have not heard from our office [phone]) in 5 working days, call the office and ask for your surgeon's nurse.  If you have other questions about your  diagnosis, plan, or surgery, call the office and ask for your surgeon's nurse.  FAMILY HISTORY OF BREAST CANCER IN FEMALE (Z80.3) Impression: refer to genetics.    Signed by Brianna Klein, MD (12/29/2015 11:12 AM)

## 2016-01-25 NOTE — Anesthesia Procedure Notes (Signed)
Procedure Name: LMA Insertion Date/Time: 01/25/2016 9:36 AM Performed by: Wanita Chamberlain Pre-anesthesia Checklist: Patient identified, Timeout performed, Emergency Drugs available, Suction available and Patient being monitored Patient Re-evaluated:Patient Re-evaluated prior to inductionOxygen Delivery Method: Circle system utilized Preoxygenation: Pre-oxygenation with 100% oxygen Intubation Type: IV induction Ventilation: Mask ventilation without difficulty LMA: LMA inserted LMA Size: 4.0 Number of attempts: 1 Placement Confirmation: positive ETCO2 and breath sounds checked- equal and bilateral Tube secured with: Tape Dental Injury: Teeth and Oropharynx as per pre-operative assessment

## 2016-01-25 NOTE — Transfer of Care (Signed)
Immediate Anesthesia Transfer of Care Note  Patient: Brianna James  Procedure(s) Performed: Procedure(s): WASHOUT LEFT BREAST HEMATOMA (Left)  Patient Location: PACU  Anesthesia Type:General  Level of Consciousness: awake, alert , oriented and patient cooperative  Airway & Oxygen Therapy: Patient Spontanous Breathing and Patient connected to nasal cannula oxygen  Post-op Assessment: Report given to RN and Post -op Vital signs reviewed and stable  Post vital signs: Reviewed and stable  Last Vitals:  Vitals:   01/25/16 0748  BP: 98/65  Pulse: 96  Resp: 20  Temp: 37.3 C    Last Pain:  Vitals:   01/25/16 0748  TempSrc: Oral         Complications: No apparent anesthesia complications

## 2016-01-25 NOTE — Discharge Instructions (Addendum)
[facility] °Office Phone Number [phone] ° ° POST OP INSTRUCTIONS ° °Always review your discharge instruction sheet given to you by the facility where your surgery was performed. ° °IF YOU HAVE DISABILITY OR FAMILY LEAVE FORMS, YOU MUST BRING THEM TO THE OFFICE FOR PROCESSING.  DO NOT GIVE THEM TO YOUR DOCTOR. ° °1. A prescription for pain medication may be given to you upon discharge.  Take your pain medication as prescribed, if needed.  If narcotic pain medicine is not needed, then you may take acetaminophen (Tylenol) or ibuprofen (Advil) as needed. °2. Take your usually prescribed medications unless otherwise directed °3. If you need a refill on your pain medication, please contact your pharmacy.  They will contact our office to request authorization.  Prescriptions will not be filled after 5pm or on week-ends. °4. You should eat very light the first 24 hours after surgery, such as soup, crackers, pudding, etc.  Resume your normal diet the day after surgery °5. It is common to experience some constipation if taking pain medication after surgery.  Increasing fluid intake and taking a stool softener will usually help or prevent this problem from occurring.  A mild laxative (Milk of Magnesia or Miralax) should be taken according to package directions if there are no bowel movements after 48 hours. °6. You may shower in 48 hours.  The surgical glue will flake off in 2-3 weeks.   °7. ACTIVITIES:  No strenuous activity or heavy lifting for 1 week.   °a. You may drive when you no longer are taking prescription pain medication, you can comfortably wear a seatbelt, and you can safely maneuver your car and apply brakes. °b. RETURN TO WORK:  __________to be determined._______________ °You should see your doctor in the office for a follow-up appointment approximately three-four weeks after your surgery.   ° °WHEN TO CALL YOUR DOCTOR: °1. Fever over 101.0 °2. Nausea and/or vomiting. °3. Extreme swelling  or bruising. °4. Continued bleeding from incision. °5. Increased pain, redness, or drainage from the incision. ° °The clinic staff is available to answer your questions during regular business hours.  Please don’t hesitate to call and ask to speak to one of the nurses for clinical concerns.  If you have a medical emergency, go to the nearest emergency room or call 911.  A surgeon from [facility] is always on call at the hospital. ° °For further questions, please visit [email or web address]  ° ° ° ° °Post Anesthesia Home Care Instructions ° °Activity: °Get plenty of rest for the remainder of the day. A responsible adult should stay with you for 24 hours following the procedure.  °For the next 24 hours, DO NOT: °-Drive a car °-Operate machinery °-Drink alcoholic beverages °-Take any medication unless instructed by your physician °-Make any legal decisions or sign important papers. ° °Meals: °Start with liquid foods such as gelatin or soup. Progress to regular foods as tolerated. Avoid greasy, spicy, heavy foods. If nausea and/or vomiting occur, drink only clear liquids until the nausea and/or vomiting subsides. Call your physician if vomiting continues. ° °Special Instructions/Symptoms: °Your throat may feel dry or sore from the anesthesia or the breathing tube placed in your throat during surgery. If this causes discomfort, gargle with warm salt water. The discomfort should disappear within 24 hours. ° °If you had a scopolamine patch placed behind your ear for the management of post- operative nausea and/or vomiting: ° °1. The medication in the patch is effective for 72 hours, after   which it should be removed.  Wrap patch in a tissue and discard in the trash. Wash hands thoroughly with soap and water. °2. You may remove the patch earlier than 72 hours if you experience unpleasant side effects which may include dry mouth, dizziness or visual disturbances. °3. Avoid touching the patch. Wash your  hands with soap and water after contact with the patch. °  ° ° °

## 2016-01-25 NOTE — Op Note (Signed)
PRE-OPERATIVE DIAGNOSIS: left breast hematoma, post op  POST-OPERATIVE DIAGNOSIS:  Same  PROCEDURE:  Procedure(s): Evacuation and washout left breast hematoma  SURGEON:  Surgeon(s): [name], MD  ANESTHESIA:   general  DRAINS: none   LOCAL MEDICATIONS USED:  BUPIVICAINE   SPECIMEN:  No Specimen  DISPOSITION OF SPECIMEN:  N/A  COUNTS:  YES  DICTATION: .Dragon Dictation  PLAN OF CARE: Discharge to home after PACU  PATIENT DISPOSITION:  PACU - hemodynamically stable.  FINDINGS:  Hematoma left breast, no active bleeding  EBL: no new blood, large clot  PROCEDURE:   Patient was identified in the holding area and then taken operating room where she was placed supine on operating room table. General anesthesia was induced. Her left breast was prepped and draped in sterile fashion. A timeout was performed according to the surgical safety checklist. When all was correct, we continued.  The previous incision was opened with a #15 blade. Massive amounts of clot were evacuated. The cavity was then irrigated. There was no site of active bleeding seen. There were some sites of muscle that appeared slightly raw, and these were cauterized.   The cavity was irrigated again and reinspected several more times for hemostasis. One of the previously placed clips came out with the clot, but the other clips were intact. The axilla did not have any evidence of hemorrhage.  The cavity was then closed with interrupted 3-0 Vicryl deep dermal sutures and 4-0 Monocryl running subcuticular suture. The wound was then cleaned, dried, and dressed with Steri-Strips gauze ABDs and a breast binder. Patient was allowed to emerge from anesthesia and was taken to the PACU in stable condition. Needle, sponge, and instrument counts were correct.

## 2016-01-25 NOTE — Interval H&P Note (Signed)
History and Physical Interval Note:  01/25/2016 8:05 AM  Brianna James  has presented today for surgery, with the diagnosis of LEFT post op BREAST HEMATOMA, LEFT BREAST CANCER  The various methods of treatment have been discussed with the patient and family. After consideration of risks, benefits and other options for treatment, the patient has consented to  Procedure(s): WASHOUT LEFT BREAST HEMATOMA (Left) as a surgical intervention .  The patient's history has been reviewed, patient examined, no change in status, stable for surgery.  I have reviewed the patient's chart and labs.  Questions were answered to the patient's satisfaction.     Shaquile Lutze

## 2016-01-26 ENCOUNTER — Encounter (HOSPITAL_BASED_OUTPATIENT_CLINIC_OR_DEPARTMENT_OTHER): Payer: Self-pay | Admitting: General Surgery

## 2016-01-26 DIAGNOSIS — Z3162 Encounter for fertility preservation counseling: Secondary | ICD-10-CM | POA: Insufficient documentation

## 2016-01-30 ENCOUNTER — Telehealth: Payer: Self-pay | Admitting: Genetic Counselor

## 2016-01-30 NOTE — Telephone Encounter (Signed)
Discussed with Brianna James that her genetic test result was negative for mutations within any of 20 genes on the Breast/Ovarian Cancer Panel through [facility].  Additionally, no variants of uncertain significance (VUSes) were found.  Discussed that we cannot totally rule out a genetic cause, as our test may not be perfect at this point in time.  However, most cancer is not genetic.  Brianna James should continue to follow her doctors' recommendations for future cancer screening.  Women in the family are at an increased risk for breast cancer due to the family history, so they should make their doctors' aware of this history.  Brianna James niece and her half-sisters can likely begin annual breast mammogram/breast MRI at 95 based on Brianna James's age of diagnosis and current screening recommendations.  Brianna James knows she is welcome to call with any questions she may have.  I will email her a copy of her results.

## 2016-01-31 ENCOUNTER — Encounter: Payer: Self-pay | Admitting: Radiation Oncology

## 2016-01-31 ENCOUNTER — Ambulatory Visit: Payer: Self-pay | Admitting: Genetic Counselor

## 2016-01-31 DIAGNOSIS — C50412 Malignant neoplasm of upper-outer quadrant of left female breast: Secondary | ICD-10-CM

## 2016-01-31 DIAGNOSIS — Z803 Family history of malignant neoplasm of breast: Secondary | ICD-10-CM

## 2016-01-31 DIAGNOSIS — Z1379 Encounter for other screening for genetic and chromosomal anomalies: Secondary | ICD-10-CM

## 2016-01-31 DIAGNOSIS — Z8049 Family history of malignant neoplasm of other genital organs: Secondary | ICD-10-CM

## 2016-01-31 NOTE — Progress Notes (Signed)
Patient discussed at tumor board.  Despite close margins, re-excision is not recommended by surgeon due to the fact that these close margins are at pectoralis fascia.  I will see her for radiotherapy once she is done with chemotherapy.  -----------------------------------  Eppie Gibson, MD

## 2016-02-02 ENCOUNTER — Other Ambulatory Visit: Payer: 59

## 2016-02-02 ENCOUNTER — Encounter: Payer: Self-pay | Admitting: Hematology and Oncology

## 2016-02-02 ENCOUNTER — Ambulatory Visit (HOSPITAL_BASED_OUTPATIENT_CLINIC_OR_DEPARTMENT_OTHER): Payer: 59 | Admitting: Hematology and Oncology

## 2016-02-02 VITALS — BP 119/65 | HR 101 | Temp 98.0°F | Resp 18 | Ht 67.0 in | Wt 216.7 lb

## 2016-02-02 DIAGNOSIS — Z171 Estrogen receptor negative status [ER-]: Secondary | ICD-10-CM

## 2016-02-02 DIAGNOSIS — C50412 Malignant neoplasm of upper-outer quadrant of left female breast: Secondary | ICD-10-CM

## 2016-02-02 MED ORDER — LIDOCAINE-PRILOCAINE 2.5-2.5 % EX CREA: TOPICAL_CREAM | CUTANEOUS | 3 refills | Status: DC

## 2016-02-02 MED ORDER — ONDANSETRON HCL 8 MG PO TABS: 8.0000 mg | ORAL_TABLET | Freq: Two times a day (BID) | ORAL | 1 refills | Status: DC | PRN

## 2016-02-02 MED ORDER — PROCHLORPERAZINE MALEATE 10 MG PO TABS: 10.0000 mg | ORAL_TABLET | Freq: Four times a day (QID) | ORAL | 1 refills | Status: DC | PRN

## 2016-02-02 MED ORDER — LORAZEPAM 0.5 MG PO TABS: 0.5000 mg | ORAL_TABLET | Freq: Every day | ORAL | 0 refills | Status: DC

## 2016-02-02 MED ORDER — DEXAMETHASONE 4 MG PO TABS: 4.0000 mg | ORAL_TABLET | Freq: Every day | ORAL | 1 refills | Status: DC

## 2016-02-02 NOTE — Assessment & Plan Note (Signed)
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 I discussed the margins as well as lymph node surgeries. We also discussed the final staging along with previously performed ER/PR and HER-2/neu testing.  Treatment Plan: 1. Adjuvant chemotherapy with TCHP X 6 cycles followed by Herceptin-Perjeta maintenance for one year 2. Followed by adjuvant radiation  Chemotherapy Counseling: I discussed the risks and benefits of chemotherapy including the risks of nausea/ vomiting, risk of infection from low WBC count, fatigue due to chemo or anemia, bruising or bleeding due to low platelets, mouth sores, loss/ change in taste and decreased appetite. Liver and kidney function will be monitored through out chemotherapy as abnormalities in liver and kidney function may be a side effect of treatment. Cardiac dysfunction due to Adriamycin Herceptin and Perjeta were discussed in detail. Risk of permanent bone marrow dysfunction and leukemia due to chemo were also discussed.  RTC in 2 weeks to start chemo

## 2016-02-02 NOTE — Progress Notes (Signed)
Patient Care Team: Jearld Fenton, NP as PCP - General (Internal Medicine) Servando Salina, MD as Consulting Physician (Obstetrics and Gynecology)  DIAGNOSIS:  Encounter Diagnosis  Name Primary?  . 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 She is here to discuss the pathology report. She is healing and recovering from the recent surgery. Does not complain of much pain or discomfort at this time. She was found to have positive lymph node.  REVIEW OF SYSTEMS:   Constitutional: Denies fevers, chills or abnormal weight loss Eyes: Denies blurriness of vision Ears, nose, mouth, throat, and face: Denies mucositis or sore throat Respiratory: Denies cough, dyspnea or wheezes Cardiovascular: Denies palpitation, chest discomfort Gastrointestinal:  Denies nausea, heartburn or change in bowel habits Skin: Denies abnormal skin rashes Lymphatics: Denies new lymphadenopathy or easy bruising Neurological:Denies numbness, tingling or new weaknesses Behavioral/Psych: Mood is stable, no new changes  Extremities: No lower extremity edema Breast:  Left lumpectomy All other systems were reviewed with the patient and are negative.  I have reviewed the past  medical history, past surgical history, social history and family history with the patient and they are unchanged from previous note.  ALLERGIES:  is allergic to oxycodone and peanuts [peanut oil].  MEDICATIONS:  Current Outpatient Prescriptions  Medication Sig Dispense Refill  . Cetrorelix Acetate (CETROTIDE) 0.25 MG KIT Inject 0.25 mg into the skin daily.    Marland Kitchen doxycycline (VIBRA-TABS) 100 MG tablet Take 100 mg by mouth 2 (two) times daily.    . Follitropin Alfa (GONAL-F IJ) Inject 900 Int'l Units as directed daily.    Marland Kitchen HYDROcodone-acetaminophen (NORCO/VICODIN) 5-325 MG tablet Take 1-2 tablets by mouth every 4 (four) hours as needed for moderate pain. 30 tablet 0  . Menotropins (MENOPUR) 75 units SOLR Inject 75 Units into the skin daily.     No current facility-administered medications for this visit.     PHYSICAL EXAMINATION: ECOG PERFORMANCE STATUS: 1 - Symptomatic but completely ambulatory  Vitals:   02/02/16 1225  BP: 119/65  Pulse: (!) 101  Resp: 18  Temp: 98 F (36.7 C)   Filed Weights   02/02/16 1225  Weight: 216 lb 11.2 oz (98.3 kg)    GENERAL:alert, no distress and comfortable SKIN: skin color, texture, turgor are normal, no rashes or significant lesions EYES: normal, Conjunctiva are pink and non-injected, sclera clear OROPHARYNX:no exudate, no erythema and lips, buccal mucosa, and tongue normal  NECK: supple, thyroid normal size, non-tender, without nodularity LYMPH:  no palpable lymphadenopathy in the cervical, axillary or inguinal LUNGS: clear to auscultation and percussion with normal breathing effort HEART: regular rate & rhythm and no murmurs and no lower extremity edema ABDOMEN:abdomen  soft, non-tender and normal bowel sounds MUSCULOSKELETAL:no cyanosis of digits and no clubbing  NEURO: alert & oriented x 3 with fluent speech, no focal motor/sensory deficits EXTREMITIES: No lower extremity edema BREAST: No palpable masses or nodules in either right or  left breasts. No palpable axillary supraclavicular or infraclavicular adenopathy no breast tenderness or nipple discharge. 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 I discussed the margins as well as lymph node surgeries. We also discussed the final staging along with previously performed ER/PR and HER-2/neu testing.  Treatment Plan: 1. Adjuvant chemotherapy with TCHP X 6 cycles followed by Herceptin-Perjeta maintenance for one year 2. Followed by adjuvant radiation  Chemotherapy Counseling: I discussed the risks and benefits of chemotherapy including the risks of nausea/ vomiting, risk of infection from low WBC count, fatigue due to chemo or anemia, bruising or bleeding due to low platelets, mouth sores, loss/ change in taste and decreased appetite. Liver and kidney function will be monitored through out chemotherapy as abnormalities in liver and kidney function may be a side effect of treatment. Cardiac dysfunction due to Herceptin and Perjeta were discussed in detail. Risk of permanent bone marrow dysfunction and leukemia due to chemo were also discussed.  RTC December 7th to start chemo  No orders of the defined types were placed in this encounter.  The patient has a good  understanding of the overall plan. she agrees with it. she will call with any problems that may develop before the next visit here.   Rulon Eisenmenger, MD 02/02/16

## 2016-02-05 ENCOUNTER — Ambulatory Visit: Payer: 59 | Admitting: Hematology and Oncology

## 2016-02-05 ENCOUNTER — Encounter: Payer: Self-pay | Admitting: Hematology and Oncology

## 2016-02-05 ENCOUNTER — Other Ambulatory Visit: Payer: 59

## 2016-02-05 NOTE — Progress Notes (Signed)
Called patient from chemo ed list to introduce myself as her Estate manager/land agent and to see if she had any financial questions or concerns. Patient had a procedure this morning so she didn't really have any concern at this time. Advised patient if it is ok with her, I will meet with her on 02/22/16 before or during treatment to discuss options that may be available to her such as PAF copay assistance, [employer] First Step for Neulasta, and the [employer]. Patient was instructed to bring proof of her income on that day.

## 2016-02-16 ENCOUNTER — Ambulatory Visit (INDEPENDENT_AMBULATORY_CARE_PROVIDER_SITE_OTHER): Payer: 59 | Admitting: Family Medicine

## 2016-02-16 ENCOUNTER — Encounter: Payer: Self-pay | Admitting: Family Medicine

## 2016-02-16 VITALS — BP 112/78 | HR 78 | Temp 99.5°F | Ht 67.0 in | Wt 218.0 lb

## 2016-02-16 DIAGNOSIS — J020 Streptococcal pharyngitis: Secondary | ICD-10-CM | POA: Diagnosis not present

## 2016-02-16 LAB — POCT RAPID STREP A (OFFICE): RAPID STREP A SCREEN: POSITIVE — AB

## 2016-02-16 MED ORDER — AZITHROMYCIN 250 MG PO TABS: ORAL_TABLET | ORAL | 0 refills | Status: DC

## 2016-02-16 MED ORDER — AMOXICILLIN 500 MG PO CAPS: 500.0000 mg | ORAL_CAPSULE | Freq: Three times a day (TID) | ORAL | 0 refills | Status: DC

## 2016-02-16 MED ORDER — BENZONATATE 100 MG PO CAPS: 100.0000 mg | ORAL_CAPSULE | Freq: Three times a day (TID) | ORAL | 0 refills | Status: DC | PRN

## 2016-02-16 NOTE — Patient Instructions (Signed)
Claritin (loratadine), Allegra (fexofenadine), Zyrtec (cetirizine); these are listed in order from weakest to strongest. Generic, and therefore cheaper, options are in the parentheses.   Flonase (fluticasone); nasal spray that is over the counter. 2 sprays each nostril, once daily. Aim towards the same side eye when you spray.  There are available OTC, and the generic versions, which may be cheaper, are in parentheses. Show this to a pharmacist if you have trouble finding any of these items.  

## 2016-02-16 NOTE — Progress Notes (Signed)
Pre visit review using our clinic review tool, if applicable. No additional management support is needed unless otherwise documented below in the visit note. 

## 2016-02-16 NOTE — Addendum Note (Signed)
Addended by: Tasia Catchings on: 02/16/2016 03:03 PM   Modules accepted: Orders

## 2016-02-16 NOTE — Addendum Note (Signed)
Addended by: Ames Coupe on: 02/16/2016 12:19 PM   Modules accepted: Orders

## 2016-02-16 NOTE — Progress Notes (Addendum)
Chief Complaint  Patient presents with  . Sore Throat  . Nasal Congestion  . Cough    Brianna James here for URI complaints.  Duration: 2.5 weeks  Associated symptoms: sinus congestion, fevers, sore throat and productive cough Denies: sinus pain, rhinorrhea, ear pain, ear drainage and shortness of breath Treatment to date: Theraflu, Mucinex Sick contacts: Yes  ROS:  Const: +fevers HEENT: As noted in HPI Lungs: No SOB  Past Medical History:  Diagnosis Date  . Cancer ([place]) 01/2016   breast  . Chicken pox   . Hematoma    left breast  . HSV infection   . PONV (postoperative nausea and vomiting)    Family History  Problem Relation Age of Onset  . Arthritis Mother   . Stroke Mother   . Stroke Maternal Aunt   . Stroke Maternal Uncle   . Breast cancer Maternal Grandmother     dx at 68 or younger; d. 92y  . Lung cancer Maternal Grandfather   . Stroke Paternal Grandmother   . Uterine cancer Paternal Grandmother     dx. 61-62  . Other Paternal Grandmother     colostomy bag in her 64s; unspecified reason  . Lung cancer Paternal Grandfather   . Uterine cancer Maternal Aunt     dx unspecified age  . Breast cancer Other     maternal great grandmother (MGM's mother) d. 52    BP 112/78 (BP Location: Left Arm, Patient Position: Sitting, Cuff Size: Normal)   Pulse 78   Temp 99.5 F (37.5 C) (Oral)   Ht 5\' 7"  (1.702 m)   Wt 218 lb (98.9 kg)   LMP 01/22/2016 (Exact Date)   SpO2 98%   BMI 34.14 kg/m  General: Awake, alert, appears stated age 35: AT, [place], ears patent b/l and TM's neg, nares patent w/o discharge, R turbinate mildly enlarged, pharynx mildly erythematous and without exudates, MMM Neck: No masses or asymmetry Heart: RRR, no murmurs, no bruits Lungs: CTAB, no accessory muscle use Psych: Age appropriate judgment and insight, normal mood and affect  Streptococcal sore throat - Plan: benzonatate (TESSALON) 100 MG capsule, amoxicillin (AMOXIL) 500 MG  capsule  Orders as above. Rapid strep pos. Supportive care + abx. Throw away toothbrush after being on abx for 24 hours. F/u as needed. Pt voiced understanding and agreement to the plan.  [place], DO 02/16/16 12:18 PM

## 2016-02-20 ENCOUNTER — Other Ambulatory Visit (HOSPITAL_COMMUNITY): Payer: Self-pay | Admitting: *Deleted

## 2016-02-20 ENCOUNTER — Ambulatory Visit (HOSPITAL_COMMUNITY)
Admission: RE | Admit: 2016-02-20 | Discharge: 2016-02-20 | Disposition: A | Discharge status: Home / Self Care | Payer: 59 | Source: Ambulatory Visit | Attending: Internal Medicine | Admitting: Internal Medicine

## 2016-02-20 ENCOUNTER — Ambulatory Visit (HOSPITAL_BASED_OUTPATIENT_CLINIC_OR_DEPARTMENT_OTHER)
Admission: RE | Admit: 2016-02-20 | Discharge: 2016-02-20 | Disposition: A | Discharge status: Home / Self Care | Payer: 59 | Source: Ambulatory Visit | Attending: Internal Medicine | Admitting: Internal Medicine

## 2016-02-20 ENCOUNTER — Encounter (HOSPITAL_COMMUNITY): Payer: Self-pay | Admitting: Internal Medicine

## 2016-02-20 ENCOUNTER — Encounter: Payer: Self-pay | Admitting: Pharmacist

## 2016-02-20 VITALS — BP 125/77 | HR 77 | Wt 216.1 lb

## 2016-02-20 DIAGNOSIS — C50412 Malignant neoplasm of upper-outer quadrant of left female breast: Secondary | ICD-10-CM

## 2016-02-20 DIAGNOSIS — C50411 Malignant neoplasm of upper-outer quadrant of right female breast: Secondary | ICD-10-CM

## 2016-02-20 DIAGNOSIS — Z171 Estrogen receptor negative status [ER-]: Secondary | ICD-10-CM | POA: Diagnosis not present

## 2016-02-20 NOTE — Progress Notes (Signed)
  Echocardiogram 2D Echocardiogram has been performed.  Tresa Res 02/20/2016, 12:12 PM

## 2016-02-20 NOTE — Progress Notes (Signed)
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 Lindi Adie.   Denies any h/o known cardiac disease.   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 Denies exertional dyspnea or CP. No HF symptoms  Echo 02/20/16 (reviewed personally): EF 60-65% Lateral s' 12.1 cm/s. GLS -20.8%   Review of Systems: [y] = yes, '[ ]'  = no   General: Weight gain '[ ]' ; Weight loss '[ ]' ; Anorexia '[ ]' ; Fatigue '[ ]' ; Fever '[ ]' ; Chills '[ ]' ; Weakness '[ ]'   Cardiac: Chest pain/pressure '[ ]' ; Resting SOB '[ ]' ; Exertional SOB '[ ]' ; Orthopnea '[ ]' ; Pedal Edema '[ ]' ; Palpitations '[ ]' ; Syncope '[ ]' ; Presyncope '[ ]' ; Paroxysmal nocturnal dyspnea'[ ]'   Pulmonary: Cough '[ ]' ; Wheezing'[ ]' ; Hemoptysis'[ ]' ; Sputum '[ ]' ; Snoring '[ ]'   GI: Vomiting'[ ]' ; Dysphagia'[ ]' ; Melena'[ ]' ; Hematochezia '[ ]' ; Heartburn'[ ]' ; Abdominal pain '[ ]' ; Constipation '[ ]' ; Diarrhea '[ ]' ; BRBPR '[ ]'   GU: Hematuria'[ ]' ; Dysuria '[ ]' ; Nocturia'[ ]'   Vascular: Pain in legs with walking '[ ]' ; Pain in feet with lying flat '[ ]' ; Non-healing sores '[ ]' ; Stroke '[ ]' ; TIA '[ ]' ; Slurred speech '[ ]' ;  Neuro: Headaches'[ ]' ; Vertigo'[ ]' ; Seizures'[ ]' ; Paresthesias'[ ]' ;Blurred vision '[ ]' ; Diplopia '[ ]' ; Vision changes '[ ]'   Ortho/Skin: Arthritis '[ ]' ; Joint pain '[ ]' ; Muscle pain '[ ]' ; Joint swelling '[ ]' ; Back Pain '[ ]' ; Rash '[ ]'   Psych: Depression'[ ]' ; Anxiety'[ ]'   Heme: Bleeding problems '[ ]' ; Clotting disorders '[ ]' ;  Anemia '[ ]'   Endocrine: Diabetes '[ ]' ; Thyroid dysfunction'[ ]'    Past Medical History:  Diagnosis Date  . Cancer ([place]) 01/2016   breast  . Chicken pox   . Hematoma    left breast  . HSV infection   . PONV (postoperative nausea and vomiting)     Current Outpatient Prescriptions  Medication Sig Dispense Refill  . amoxicillin (AMOXIL) 500 MG capsule Take 1 capsule (500 mg total) by mouth 3 (three) times daily. 30 capsule 0  . benzonatate (TESSALON) 100 MG capsule Take 1 capsule (100 mg total) by mouth 3 (three) times daily as needed. 20 capsule 0  . Cetrorelix Acetate (CETROTIDE) 0.25 MG KIT Inject 0.25 mg into the skin daily.    Marland Kitchen dexamethasone (DECADRON) 4 MG tablet Take 1 tablet (4 mg total) by mouth daily. Take 1 tablet daily before and 1 tablet day after chemotherapy 30 tablet 1  . lidocaine-prilocaine (EMLA) cream Apply to affected area once 30 g 3  . LORazepam (ATIVAN) 0.5 MG tablet Take 1 tablet (0.5 mg total) by mouth at bedtime. As needed for sleep 30 tablet 0  . Menotropins (MENOPUR) 75 units SOLR Inject 75 Units into the skin daily.    . ondansetron (ZOFRAN) 8 MG tablet Take 1 tablet (8 mg  total) by mouth 2 (two) times daily as needed for refractory nausea / vomiting. Start on day 3 after chemo. 30 tablet 1  . prochlorperazine (COMPAZINE) 10 MG tablet Take 1 tablet (10 mg total) by mouth every 6 (six) hours as needed (Nausea or vomiting). 30 tablet 1   No current facility-administered medications for this encounter.     Allergies  Allergen Reactions  . Oxycodone Nausea And Vomiting    Pt reports adverse reaction.   . Peanuts [Peanut Oil] Hives      Social History   Social History  . Marital status: Married    Spouse name: N/A  . Number of children: N/A  . Years of education: N/A   Occupational History  . Not on file.   Social History Main Topics  . Smoking status: Never Smoker  . Smokeless tobacco: Never Used  . Alcohol use 0.0 oz/week     Comment: rare -  wine maybe 2x per yr  . Drug use: No  . Sexual activity: Yes    Birth control/ protection: None   Other Topics Concern  . Not on file   Social History Narrative  . No narrative on file      Family History  Problem Relation Age of Onset  . Arthritis Mother   . Stroke Mother   . Stroke Maternal Aunt   . Stroke Maternal Uncle   . Breast cancer Maternal Grandmother     dx at 57 or younger; d. 36y  . Lung cancer Maternal Grandfather   . Stroke Paternal Grandmother   . Uterine cancer Paternal Grandmother     dx. 61-62  . Other Paternal Grandmother     colostomy bag in her 23s; unspecified reason  . Lung cancer Paternal Grandfather   . Uterine cancer Maternal Aunt     dx unspecified age  . Breast cancer Other     maternal great grandmother (MGM's mother) d. 53    Vitals:   02/20/16 1213  BP: 125/77  Pulse: 77  SpO2: 100%  Weight: 216 lb 1.9 oz (98 kg)    PHYSICAL EXAM: General:  Well appearing. No respiratory difficulty HEENT: normal Neck: supple. no JVD. Carotids 2+ bilat; no bruits. No lymphadenopathy or thryomegaly appreciated. Cor: PMI nondisplaced. Regular rate & rhythm. No rubs, gallops or murmurs. Lungs: clear Abdomen: obese. soft, nontender, nondistended. No hepatosplenomegaly. No bruits or masses. Good bowel sounds. Extremities: no cyanosis, clubbing, rash, edema Neuro: alert & oriented x 3, cranial nerves grossly intact. moves all 4 extremities w/o difficulty. Affect pleasant.  ASSESSMENT & PLAN:  1. Breast cancer of upper-outer quadrant of left female breast ([place]) --01/17/16: Left Lumpectomy: IDC 4.1 cm with DCIS, Margin 0.1 cm, 1/3 LN Positive, ER/PR Neg, Her 2 Pos with Ki 67 of 40%, T2N1 (stage 2B) - Treatment Plan: Adjuvant chemotherapy with TCHP X 6 cycles followed by Herceptin-Perjeta maintenance for one year. Followed by adjuvant radiation - Explained incidence of Herceptin cardiotoxicity and role of Cardio-oncology clinic at length. Echo images  reviewed personally. All parameters stable. Reviewed signs and symptoms of HF to look for. Continue Herceptin. Follow-up with echo in 3 months.  Bensimhon, Daniel,MD 10:12 PM

## 2016-02-20 NOTE — Patient Instructions (Signed)
Follow up in 3 Months with Echo

## 2016-02-22 ENCOUNTER — Other Ambulatory Visit (HOSPITAL_BASED_OUTPATIENT_CLINIC_OR_DEPARTMENT_OTHER): Payer: 59

## 2016-02-22 ENCOUNTER — Ambulatory Visit (HOSPITAL_BASED_OUTPATIENT_CLINIC_OR_DEPARTMENT_OTHER): Payer: 59

## 2016-02-22 ENCOUNTER — Other Ambulatory Visit: Payer: Self-pay

## 2016-02-22 ENCOUNTER — Other Ambulatory Visit: Payer: Self-pay | Admitting: Hematology and Oncology

## 2016-02-22 ENCOUNTER — Encounter: Payer: Self-pay | Admitting: *Deleted

## 2016-02-22 VITALS — BP 135/88 | HR 79 | Temp 99.5°F | Resp 18

## 2016-02-22 DIAGNOSIS — Z5111 Encounter for antineoplastic chemotherapy: Secondary | ICD-10-CM

## 2016-02-22 DIAGNOSIS — C50412 Malignant neoplasm of upper-outer quadrant of left female breast: Secondary | ICD-10-CM

## 2016-02-22 DIAGNOSIS — C50411 Malignant neoplasm of upper-outer quadrant of right female breast: Secondary | ICD-10-CM | POA: Diagnosis not present

## 2016-02-22 DIAGNOSIS — Z5112 Encounter for antineoplastic immunotherapy: Secondary | ICD-10-CM

## 2016-02-22 DIAGNOSIS — Z171 Estrogen receptor negative status [ER-]: Principal | ICD-10-CM

## 2016-02-22 LAB — COMPREHENSIVE METABOLIC PANEL
ALT: 10 U/L (ref 0–55)
AST: 18 U/L (ref 5–34)
Albumin: 3.6 g/dL (ref 3.5–5.0)
Alkaline Phosphatase: 88 U/L (ref 40–150)
Anion Gap: 11 mEq/L (ref 3–11)
BUN: 11.3 mg/dL (ref 7.0–26.0)
CHLORIDE: 105 meq/L (ref 98–109)
CO2: 22 meq/L (ref 22–29)
Calcium: 9.6 mg/dL (ref 8.4–10.4)
Creatinine: 0.9 mg/dL (ref 0.6–1.1)
GLUCOSE: 114 mg/dL (ref 70–140)
POTASSIUM: 4.2 meq/L (ref 3.5–5.1)
SODIUM: 138 meq/L (ref 136–145)
TOTAL PROTEIN: 8 g/dL (ref 6.4–8.3)
Total Bilirubin: 0.27 mg/dL (ref 0.20–1.20)

## 2016-02-22 LAB — CBC WITH DIFFERENTIAL/PLATELET
BASO%: 0.1 % (ref 0.0–2.0)
BASOS ABS: 0 10*3/uL (ref 0.0–0.1)
EOS ABS: 0 10*3/uL (ref 0.0–0.5)
EOS%: 0 % (ref 0.0–7.0)
HEMATOCRIT: 32.8 % — AB (ref 34.8–46.6)
HEMOGLOBIN: 10.4 g/dL — AB (ref 11.6–15.9)
LYMPH#: 2.5 10*3/uL (ref 0.9–3.3)
LYMPH%: 19.3 % (ref 14.0–49.7)
MCH: 24.1 pg — AB (ref 25.1–34.0)
MCHC: 31.7 g/dL (ref 31.5–36.0)
MCV: 76.1 fL — ABNORMAL LOW (ref 79.5–101.0)
MONO#: 0.7 10*3/uL (ref 0.1–0.9)
MONO%: 5.6 % (ref 0.0–14.0)
NEUT#: 9.9 10*3/uL — ABNORMAL HIGH (ref 1.5–6.5)
NEUT%: 75 % (ref 38.4–76.8)
PLATELETS: 354 10*3/uL (ref 145–400)
RBC: 4.31 10*6/uL (ref 3.70–5.45)
RDW: 15 % — AB (ref 11.2–14.5)
WBC: 13.1 10*3/uL — ABNORMAL HIGH (ref 3.9–10.3)

## 2016-02-22 MED ORDER — PALONOSETRON HCL INJECTION 0.25 MG/5ML
INTRAVENOUS | Status: AC
  Filled 2016-02-22: qty 5

## 2016-02-22 MED ORDER — HEPARIN SOD (PORK) LOCK FLUSH 100 UNIT/ML IV SOLN
500.0000 [IU] | Freq: Once | INTRAVENOUS | Status: AC | PRN
  Administered 2016-02-22: 500 [IU]
  Filled 2016-02-22: qty 5

## 2016-02-22 MED ORDER — PEGFILGRASTIM 6 MG/0.6ML ~~LOC~~ PSKT
6.0000 mg | PREFILLED_SYRINGE | Freq: Once | SUBCUTANEOUS | Status: AC
  Administered 2016-02-22: 6 mg via SUBCUTANEOUS
  Filled 2016-02-22: qty 0.6

## 2016-02-22 MED ORDER — SODIUM CHLORIDE 0.9 % IV SOLN
750.0000 mg | Freq: Once | INTRAVENOUS | Status: AC
  Administered 2016-02-22: 750 mg via INTRAVENOUS
  Filled 2016-02-22: qty 75

## 2016-02-22 MED ORDER — DIPHENHYDRAMINE HCL 25 MG PO CAPS
ORAL_CAPSULE | ORAL | Status: AC
  Filled 2016-02-22: qty 2

## 2016-02-22 MED ORDER — SODIUM CHLORIDE 0.9% FLUSH
10.0000 mL | INTRAVENOUS | Status: DC | PRN
  Administered 2016-02-22: 10 mL
  Filled 2016-02-22: qty 10

## 2016-02-22 MED ORDER — PERTUZUMAB CHEMO INJECTION 420 MG/14ML
840.0000 mg | Freq: Once | INTRAVENOUS | Status: AC
  Administered 2016-02-22: 840 mg via INTRAVENOUS
  Filled 2016-02-22: qty 28

## 2016-02-22 MED ORDER — ACETAMINOPHEN 325 MG PO TABS
650.0000 mg | ORAL_TABLET | Freq: Once | ORAL | Status: AC
  Administered 2016-02-22: 650 mg via ORAL

## 2016-02-22 MED ORDER — DEXAMETHASONE SODIUM PHOSPHATE 10 MG/ML IJ SOLN
10.0000 mg | Freq: Once | INTRAMUSCULAR | Status: AC
  Administered 2016-02-22: 10 mg via INTRAVENOUS

## 2016-02-22 MED ORDER — SODIUM CHLORIDE 0.9 % IV SOLN
8.0000 mg/kg | Freq: Once | INTRAVENOUS | Status: AC
  Administered 2016-02-22: 777 mg via INTRAVENOUS
  Filled 2016-02-22: qty 37

## 2016-02-22 MED ORDER — DIPHENHYDRAMINE HCL 25 MG PO CAPS
50.0000 mg | ORAL_CAPSULE | Freq: Once | ORAL | Status: AC
  Administered 2016-02-22: 50 mg via ORAL

## 2016-02-22 MED ORDER — SODIUM CHLORIDE 0.9 % IV SOLN
Freq: Once | INTRAVENOUS | Status: AC
  Administered 2016-02-22: 10:00:00 via INTRAVENOUS

## 2016-02-22 MED ORDER — DEXAMETHASONE SODIUM PHOSPHATE 10 MG/ML IJ SOLN
INTRAMUSCULAR | Status: AC
  Filled 2016-02-22: qty 1

## 2016-02-22 MED ORDER — SODIUM CHLORIDE 0.9 % IV SOLN
75.0000 mg/m2 | Freq: Once | INTRAVENOUS | Status: AC
  Administered 2016-02-22: 160 mg via INTRAVENOUS
  Filled 2016-02-22: qty 16

## 2016-02-22 MED ORDER — CARBOPLATIN CHEMO INJECTION 600 MG/60ML: 750.0000 mg | Freq: Once | INTRAVENOUS | Status: DC

## 2016-02-22 MED ORDER — ACETAMINOPHEN 325 MG PO TABS
ORAL_TABLET | ORAL | Status: AC
  Filled 2016-02-22: qty 2

## 2016-02-22 MED ORDER — PALONOSETRON HCL INJECTION 0.25 MG/5ML
0.2500 mg | Freq: Once | INTRAVENOUS | Status: AC
  Administered 2016-02-22: 0.25 mg via INTRAVENOUS

## 2016-02-22 NOTE — Progress Notes (Signed)
Patient and vital signs stable upon discharge. Discharge instructions reviewed with patient.

## 2016-02-22 NOTE — Patient Instructions (Addendum)
[facility] Discharge Instructions for Patients Receiving Chemotherapy  Today you received the following chemotherapy agents Herceptin, Perjeta, Taxotere and Carboplatin  To help prevent nausea and vomiting after your treatment, we encourage you to take your nausea medication as directed. No Zofran for 3 days. Take Compazine instead.    If you develop nausea and vomiting that is not controlled by your nausea medication, call the clinic.   BELOW ARE SYMPTOMS THAT SHOULD BE REPORTED IMMEDIATELY:  *FEVER GREATER THAN 100.5 F  *CHILLS WITH OR WITHOUT FEVER  NAUSEA AND VOMITING THAT IS NOT CONTROLLED WITH YOUR NAUSEA MEDICATION  *UNUSUAL SHORTNESS OF BREATH  *UNUSUAL BRUISING OR BLEEDING  TENDERNESS IN MOUTH AND THROAT WITH OR WITHOUT PRESENCE OF ULCERS  *URINARY PROBLEMS  *BOWEL PROBLEMS  UNUSUAL RASH Items with * indicate a potential emergency and should be followed up as soon as possible.  Feel free to call the clinic you have any questions or concerns. The clinic phone number is [phone].  Please show the [facility] at check-in to the Emergency Department and triage nurse.  Trastuzumab injection for infusion What is this medicine? TRASTUZUMAB (tras TOO zoo mab) is a monoclonal antibody. It is used to treat breast cancer and stomach cancer. This medicine may be used for other purposes; ask your health care provider or pharmacist if you have questions. COMMON BRAND NAME(S): Herceptin What should I tell my health care provider before I take this medicine? They need to know if you have any of these conditions: -heart disease -heart failure -infection (especially a virus infection such as chickenpox, cold sores, or herpes) -lung or breathing disease, like asthma -recent or ongoing radiation therapy -an unusual or allergic reaction to trastuzumab, benzyl alcohol, or other medications, foods, dyes, or preservatives -pregnant or trying to get  pregnant -breast-feeding How should I use this medicine? This drug is given as an infusion into a vein. It is administered in a hospital or clinic by a specially trained health care professional. Talk to your pediatrician regarding the use of this medicine in children. This medicine is not approved for use in children. Overdosage: If you think you have taken too much of this medicine contact a poison control center or emergency room at once. NOTE: This medicine is only for you. Do not share this medicine with others. What if I miss a dose? It is important not to miss a dose. Call your doctor or health care professional if you are unable to keep an appointment. What may interact with this medicine? -doxorubicin -warfarin This list may not describe all possible interactions. Give your health care provider a list of all the medicines, herbs, non-prescription drugs, or dietary supplements you use. Also tell them if you smoke, drink alcohol, or use illegal drugs. Some items may interact with your medicine. What should I watch for while using this medicine? Visit your doctor for checks on your progress. Report any side effects. Continue your course of treatment even though you feel ill unless your doctor tells you to stop. Call your doctor or health care professional for advice if you get a fever, chills or sore throat, or other symptoms of a cold or flu. Do not treat yourself. Try to avoid being around people who are sick. You may experience fever, chills and shaking during your first infusion. These effects are usually mild and can be treated with other medicines. Report any side effects during the infusion to your health care professional. Fever and chills  usually do not happen with later infusions. Do not become pregnant while taking this medicine or for 7 months after stopping it. Women should inform their doctor if they wish to become pregnant or think they might be pregnant. Women of child-bearing  potential will need to have a negative pregnancy test before starting this medicine. There is a potential for serious side effects to an unborn child. Talk to your health care professional or pharmacist for more information. Do not breast-feed an infant while taking this medicine or for 7 months after stopping it. Women must use effective birth control with this medicine. What side effects may I notice from receiving this medicine? Side effects that you should report to your doctor or health care professional as soon as possible: -breathing difficulties -chest pain or palpitations -cough -dizziness or fainting -fever or chills, sore throat -skin rash, itching or hives -swelling of the legs or ankles -unusually weak or tired Side effects that usually do not require medical attention (report to your doctor or health care professional if they continue or are bothersome): -loss of appetite -headache -muscle aches -nausea This list may not describe all possible side effects. Call your doctor for medical advice about side effects. You may report side effects to FDA at 1-800-FDA-1088. Where should I keep my medicine? This drug is given in a hospital or clinic and will not be stored at home. NOTE: This sheet is a summary. It may not cover all possible information. If you have questions about this medicine, talk to your doctor, pharmacist, or health care provider.  2017 Elsevier/Gold Standard (2015-04-05 17:16:44)  Pertuzumab injection What is this medicine? PERTUZUMAB (per TOOZ ue mab) is a monoclonal antibody. It is used to treat breast cancer. COMMON BRAND NAME(S): PERJETA What should I tell my health care provider before I take this medicine? They need to know if you have any of these conditions: -heart disease -heart failure -high blood pressure -history of irregular heart beat -recent or ongoing radiation therapy -an unusual or allergic reaction to pertuzumab, other medicines, foods,  dyes, or preservatives -pregnant or trying to get pregnant -breast-feeding How should I use this medicine? This medicine is for infusion into a vein. It is given by a health care professional in a hospital or clinic setting. Talk to your pediatrician regarding the use of this medicine in children. Special care may be needed. What if I miss a dose? It is important not to miss your dose. Call your doctor or health care professional if you are unable to keep an appointment. What may interact with this medicine? Interactions are not expected. Give your health care provider a list of all the medicines, herbs, non-prescription drugs, or dietary supplements you use. Also tell them if you smoke, drink alcohol, or use illegal drugs. Some items may interact with your medicine. What should I watch for while using this medicine? Your condition will be monitored carefully while you are receiving this medicine. Report any side effects. Continue your course of treatment even though you feel ill unless your doctor tells you to stop. Do not become pregnant while taking this medicine or for 7 months after stopping it. Women should inform their doctor if they wish to become pregnant or think they might be pregnant. Women of child-bearing potential will need to have a negative pregnancy test before starting this medicine. There is a potential for serious side effects to an unborn child. Talk to your health care professional or pharmacist for more information. Do  not breast-feed an infant while taking this medicine or for 7 months after stopping it. Women must use effective birth control with this medicine. Call your doctor or health care professional for advice if you get a fever, chills or sore throat, or other symptoms of a cold or flu. Do not treat yourself. Try to avoid being around people who are sick. You may experience fever, chills, and headache during the infusion. Report any side effects during the infusion to  your health care professional. What side effects may I notice from receiving this medicine? Side effects that you should report to your doctor or health care professional as soon as possible: -breathing problems -chest pain or palpitations -dizziness -feeling faint or lightheaded -fever or chills -skin rash, itching or hives -sore throat -swelling of the face, lips, or tongue -swelling of the legs or ankles -unusually weak or tired Side effects that usually do not require medical attention (report to your doctor or health care professional if they continue or are bothersome): -diarrhea -hair loss -nausea, vomiting -tiredness Where should I keep my medicine? This drug is given in a hospital or clinic and will not be stored at home.  2017 Elsevier/Gold Standard (2015-04-06 12:08:50)  Docetaxel injection What is this medicine? DOCETAXEL (doe se TAX el) is a chemotherapy drug. It targets fast dividing cells, like cancer cells, and causes these cells to die. This medicine is used to treat many types of cancers like breast cancer, certain stomach cancers, head and neck cancer, lung cancer, and prostate cancer. This medicine may be used for other purposes; ask your health care provider or pharmacist if you have questions. COMMON BRAND NAME(S): Docefrez, Taxotere What should I tell my health care provider before I take this medicine? They need to know if you have any of these conditions: -infection (especially a virus infection such as chickenpox, cold sores, or herpes) -liver disease -low blood counts, like low white cell, platelet, or red cell counts -an unusual or allergic reaction to docetaxel, polysorbate 80, other chemotherapy agents, other medicines, foods, dyes, or preservatives -pregnant or trying to get pregnant -breast-feeding How should I use this medicine? This drug is given as an infusion into a vein. It is administered in a hospital or clinic by a specially trained health  care professional. Talk to your pediatrician regarding the use of this medicine in children. Special care may be needed. Overdosage: If you think you have taken too much of this medicine contact a poison control center or emergency room at once. NOTE: This medicine is only for you. Do not share this medicine with others. What if I miss a dose? It is important not to miss your dose. Call your doctor or health care professional if you are unable to keep an appointment. What may interact with this medicine? -cyclosporine -erythromycin -ketoconazole -medicines to increase blood counts like filgrastim, pegfilgrastim, sargramostim -vaccines Talk to your doctor or health care professional before taking any of these medicines: -acetaminophen -aspirin -ibuprofen -ketoprofen -naproxen This list may not describe all possible interactions. Give your health care provider a list of all the medicines, herbs, non-prescription drugs, or dietary supplements you use. Also tell them if you smoke, drink alcohol, or use illegal drugs. Some items may interact with your medicine. What should I watch for while using this medicine? Your condition will be monitored carefully while you are receiving this medicine. You will need important blood work done while you are taking this medicine. This drug may make you feel  generally unwell. This is not uncommon, as chemotherapy can affect healthy cells as well as cancer cells. Report any side effects. Continue your course of treatment even though you feel ill unless your doctor tells you to stop. In some cases, you may be given additional medicines to help with side effects. Follow all directions for their use. Call your doctor or health care professional for advice if you get a fever, chills or sore throat, or other symptoms of a cold or flu. Do not treat yourself. This drug decreases your body's ability to fight infections. Try to avoid being around people who are sick. This  medicine may increase your risk to bruise or bleed. Call your doctor or health care professional if you notice any unusual bleeding. This medicine may contain alcohol in the product. You may get drowsy or dizzy. Do not drive, use machinery, or do anything that needs mental alertness until you know how this medicine affects you. Do not stand or sit up quickly, especially if you are an older patient. This reduces the risk of dizzy or fainting spells. Avoid alcoholic drinks. Do not become pregnant while taking this medicine. Women should inform their doctor if they wish to become pregnant or think they might be pregnant. There is a potential for serious side effects to an unborn child. Talk to your health care professional or pharmacist for more information. Do not breast-feed an infant while taking this medicine. What side effects may I notice from receiving this medicine? Side effects that you should report to your doctor or health care professional as soon as possible: -allergic reactions like skin rash, itching or hives, swelling of the face, lips, or tongue -low blood counts - This drug may decrease the number of white blood cells, red blood cells and platelets. You may be at increased risk for infections and bleeding. -signs of infection - fever or chills, cough, sore throat, pain or difficulty passing urine -signs of decreased platelets or bleeding - bruising, pinpoint red spots on the skin, black, tarry stools, nosebleeds -signs of decreased red blood cells - unusually weak or tired, fainting spells, lightheadedness -breathing problems -fast or irregular heartbeat -low blood pressure -mouth sores -nausea and vomiting -pain, swelling, redness or irritation at the injection site -pain, tingling, numbness in the hands or feet -swelling of the ankle, feet, hands -weight gain Side effects that usually do not require medical attention (report to your doctor or health care professional if they  continue or are bothersome): -bone pain -complete hair loss including hair on your head, underarms, pubic hair, eyebrows, and eyelashes -diarrhea -excessive tearing -changes in the color of fingernails -loosening of the fingernails -nausea -muscle pain -red flush to skin -sweating -weak or tired This list may not describe all possible side effects. Call your doctor for medical advice about side effects. You may report side effects to FDA at 1-800-FDA-1088. Where should I keep my medicine? This drug is given in a hospital or clinic and will not be stored at home. NOTE: This sheet is a summary. It may not cover all possible information. If you have questions about this medicine, talk to your doctor, pharmacist, or health care provider.  2017 Elsevier/Gold Standard (2015-04-06 12:32:56)  Carboplatin injection What is this medicine? CARBOPLATIN (KAR boe pla tin) is a chemotherapy drug. It targets fast dividing cells, like cancer cells, and causes these cells to die. This medicine is used to treat ovarian cancer and many other cancers. This medicine may be used for  other purposes; ask your health care provider or pharmacist if you have questions. COMMON BRAND NAME(S): Paraplatin What should I tell my health care provider before I take this medicine? They need to know if you have any of these conditions: -blood disorders -hearing problems -kidney disease -recent or ongoing radiation therapy -an unusual or allergic reaction to carboplatin, cisplatin, other chemotherapy, other medicines, foods, dyes, or preservatives -pregnant or trying to get pregnant -breast-feeding How should I use this medicine? This drug is usually given as an infusion into a vein. It is administered in a hospital or clinic by a specially trained health care professional. Talk to your pediatrician regarding the use of this medicine in children. Special care may be needed. Overdosage: If you think you have taken too  much of this medicine contact a poison control center or emergency room at once. NOTE: This medicine is only for you. Do not share this medicine with others. What if I miss a dose? It is important not to miss a dose. Call your doctor or health care professional if you are unable to keep an appointment. What may interact with this medicine? -medicines for seizures -medicines to increase blood counts like filgrastim, pegfilgrastim, sargramostim -some antibiotics like amikacin, gentamicin, neomycin, streptomycin, tobramycin -vaccines Talk to your doctor or health care professional before taking any of these medicines: -acetaminophen -aspirin -ibuprofen -ketoprofen -naproxen This list may not describe all possible interactions. Give your health care provider a list of all the medicines, herbs, non-prescription drugs, or dietary supplements you use. Also tell them if you smoke, drink alcohol, or use illegal drugs. Some items may interact with your medicine. What should I watch for while using this medicine? Your condition will be monitored carefully while you are receiving this medicine. You will need important blood work done while you are taking this medicine. This drug may make you feel generally unwell. This is not uncommon, as chemotherapy can affect healthy cells as well as cancer cells. Report any side effects. Continue your course of treatment even though you feel ill unless your doctor tells you to stop. In some cases, you may be given additional medicines to help with side effects. Follow all directions for their use. Call your doctor or health care professional for advice if you get a fever, chills or sore throat, or other symptoms of a cold or flu. Do not treat yourself. This drug decreases your body's ability to fight infections. Try to avoid being around people who are sick. This medicine may increase your risk to bruise or bleed. Call your doctor or health care professional if you  notice any unusual bleeding. Be careful brushing and flossing your teeth or using a toothpick because you may get an infection or bleed more easily. If you have any dental work done, tell your dentist you are receiving this medicine. Avoid taking products that contain aspirin, acetaminophen, ibuprofen, naproxen, or ketoprofen unless instructed by your doctor. These medicines may hide a fever. Do not become pregnant while taking this medicine. Women should inform their doctor if they wish to become pregnant or think they might be pregnant. There is a potential for serious side effects to an unborn child. Talk to your health care professional or pharmacist for more information. Do not breast-feed an infant while taking this medicine. What side effects may I notice from receiving this medicine? Side effects that you should report to your doctor or health care professional as soon as possible: -allergic reactions like skin rash, itching  or hives, swelling of the face, lips, or tongue -signs of infection - fever or chills, cough, sore throat, pain or difficulty passing urine -signs of decreased platelets or bleeding - bruising, pinpoint red spots on the skin, black, tarry stools, nosebleeds -signs of decreased red blood cells - unusually weak or tired, fainting spells, lightheadedness -breathing problems -changes in hearing -changes in vision -chest pain -high blood pressure -low blood counts - This drug may decrease the number of white blood cells, red blood cells and platelets. You may be at increased risk for infections and bleeding. -nausea and vomiting -pain, swelling, redness or irritation at the injection site -pain, tingling, numbness in the hands or feet -problems with balance, talking, walking -trouble passing urine or change in the amount of urine Side effects that usually do not require medical attention (report to your doctor or health care professional if they continue or are  bothersome): -hair loss -loss of appetite -metallic taste in the mouth or changes in taste This list may not describe all possible side effects. Call your doctor for medical advice about side effects. You may report side effects to FDA at 1-800-FDA-1088. Where should I keep my medicine? This drug is given in a hospital or clinic and will not be stored at home. NOTE: This sheet is a summary. It may not cover all possible information. If you have questions about this medicine, talk to your doctor, pharmacist, or health care provider.  2017 Elsevier/Gold Standard (2007-06-09 14:38:05)

## 2016-02-27 ENCOUNTER — Telehealth: Payer: Self-pay

## 2016-02-27 ENCOUNTER — Ambulatory Visit (HOSPITAL_BASED_OUTPATIENT_CLINIC_OR_DEPARTMENT_OTHER): Payer: 59 | Admitting: Nurse Practitioner

## 2016-02-27 ENCOUNTER — Ambulatory Visit (HOSPITAL_COMMUNITY)
Admission: RE | Admit: 2016-02-27 | Discharge: 2016-02-27 | Disposition: A | Discharge status: Home / Self Care | Payer: 59 | Source: Ambulatory Visit | Attending: Nurse Practitioner | Admitting: Nurse Practitioner

## 2016-02-27 ENCOUNTER — Ambulatory Visit (HOSPITAL_BASED_OUTPATIENT_CLINIC_OR_DEPARTMENT_OTHER): Payer: 59

## 2016-02-27 VITALS — BP 114/65 | HR 101 | Temp 99.3°F | Resp 18 | Ht 67.0 in | Wt 217.7 lb

## 2016-02-27 DIAGNOSIS — J40 Bronchitis, not specified as acute or chronic: Secondary | ICD-10-CM

## 2016-02-27 DIAGNOSIS — C50412 Malignant neoplasm of upper-outer quadrant of left female breast: Secondary | ICD-10-CM

## 2016-02-27 DIAGNOSIS — R197 Diarrhea, unspecified: Secondary | ICD-10-CM | POA: Diagnosis not present

## 2016-02-27 DIAGNOSIS — R928 Other abnormal and inconclusive findings on diagnostic imaging of breast: Secondary | ICD-10-CM | POA: Insufficient documentation

## 2016-02-27 DIAGNOSIS — L7632 Postprocedural hematoma of skin and subcutaneous tissue following other procedure: Secondary | ICD-10-CM

## 2016-02-27 LAB — CBC WITH DIFFERENTIAL/PLATELET
BASO%: 1.3 % (ref 0.0–2.0)
Basophils Absolute: 0.2 10*3/uL — ABNORMAL HIGH (ref 0.0–0.1)
EOS%: 0.7 % (ref 0.0–7.0)
Eosinophils Absolute: 0.1 10*3/uL (ref 0.0–0.5)
HCT: 32.1 % — ABNORMAL LOW (ref 34.8–46.6)
HGB: 10 g/dL — ABNORMAL LOW (ref 11.6–15.9)
LYMPH%: 14.9 % (ref 14.0–49.7)
MCH: 23.5 pg — ABNORMAL LOW (ref 25.1–34.0)
MCHC: 31.2 g/dL — ABNORMAL LOW (ref 31.5–36.0)
MCV: 75.2 fL — ABNORMAL LOW (ref 79.5–101.0)
MONO#: 0.3 10*3/uL (ref 0.1–0.9)
MONO%: 2.1 % (ref 0.0–14.0)
NEUT%: 81 % — ABNORMAL HIGH (ref 38.4–76.8)
NEUTROS ABS: 12.7 10*3/uL — AB (ref 1.5–6.5)
Platelets: 285 10*3/uL (ref 145–400)
RBC: 4.26 10*6/uL (ref 3.70–5.45)
RDW: 15.8 % — ABNORMAL HIGH (ref 11.2–14.5)
WBC: 15.7 10*3/uL — AB (ref 3.9–10.3)
lymph#: 2.3 10*3/uL (ref 0.9–3.3)

## 2016-02-27 LAB — COMPREHENSIVE METABOLIC PANEL
ALT: 11 U/L (ref 0–55)
AST: 17 U/L (ref 5–34)
Albumin: 3.4 g/dL — ABNORMAL LOW (ref 3.5–5.0)
Alkaline Phosphatase: 120 U/L (ref 40–150)
Anion Gap: 8 mEq/L (ref 3–11)
BILIRUBIN TOTAL: 0.64 mg/dL (ref 0.20–1.20)
BUN: 6.2 mg/dL — ABNORMAL LOW (ref 7.0–26.0)
CO2: 26 meq/L (ref 22–29)
CREATININE: 0.9 mg/dL (ref 0.6–1.1)
Calcium: 9.1 mg/dL (ref 8.4–10.4)
Chloride: 103 mEq/L (ref 98–109)
EGFR: >90 mL/min/{1.73_m2} (ref >90)
GLUCOSE: 104 mg/dL (ref 70–140)
Potassium: 4 mEq/L (ref 3.5–5.1)
SODIUM: 137 meq/L (ref 136–145)
TOTAL PROTEIN: 7.3 g/dL (ref 6.4–8.3)

## 2016-02-27 LAB — MAGNESIUM: Magnesium: 1.9 mg/dl (ref 1.5–2.5)

## 2016-02-27 MED ORDER — LEVOFLOXACIN 500 MG PO TABS: 500.0000 mg | ORAL_TABLET | Freq: Every day | ORAL | 0 refills | Status: DC

## 2016-02-27 MED ORDER — MAGIC MOUTHWASH W/LIDOCAINE: 5.0000 mL | Freq: Four times a day (QID) | ORAL | 1 refills | Status: DC | PRN

## 2016-02-27 MED FILL — MAGIC MOUTHWASH W/LIDO 1:1: 6 days supply | Qty: 120 | Fill #0

## 2016-02-27 MED FILL — levoFLOXacin 500 MG TABS: 500 | 10 days supply | Qty: 10 | Fill #0

## 2016-02-27 NOTE — Telephone Encounter (Signed)
Pt called with no fever but symptoms of cold. Called pt back. Herceptin, perjeta, taxotere, carbo on 02/22/16. Cough productive, colored green and thick. Cough last 3 days. (day 3 after chemo). sinus congestion as well. Worsening. Aches and pains. Neck pain. Diarrhea for a few days. Has 3 stools yesterday. Runny. 1 today so far.   Arranged CXR and lab and [facility] today. Pt requested about 1 pm to arrange ride. inbasket sent

## 2016-02-28 ENCOUNTER — Telehealth: Payer: Self-pay | Admitting: Nurse Practitioner

## 2016-02-28 ENCOUNTER — Encounter: Payer: Self-pay | Admitting: Nurse Practitioner

## 2016-02-28 DIAGNOSIS — J40 Bronchitis, not specified as acute or chronic: Secondary | ICD-10-CM | POA: Insufficient documentation

## 2016-02-28 DIAGNOSIS — R197 Diarrhea, unspecified: Secondary | ICD-10-CM | POA: Insufficient documentation

## 2016-02-28 NOTE — Assessment & Plan Note (Signed)
Patient states that she has been having multiple episodes of diarrhea for the past few days.  She has not tried any over-the-counter Imodium for treatment of the diarrhea.  Patient appears mildly dehydrated today; but was able to drink fluids and eat crackers while at the cancer center.  Patient was advised to start taking Imodium 2 tablets with each loose stool, maximum 8 tablets per day.  Also, patient was encouraged to push fluids at home is much as possible.  Patient was advised to call/return if she feels further dehydrated.

## 2016-02-28 NOTE — Telephone Encounter (Signed)
Called patient back regarding her issues using the Magic mouthwash solution last night.  Patient states that she developed some numbness of her tongue; and also felt like her throat was swelling.  She was concerned that she may be experiencing an allergic reaction to the Magic mouthwash-so she has discontinued all Magic mouthwash.  Patient states she's only taken one of the Levaquin antibiotic.  Tablet so far; she plans to take the second one at approximate 5:00 this evening.  She also continues with her cold/URI symptoms and cough.  She states that she is taking DayQuil and NyQuil as directed as well.  Advised patient that she could return to the [facility] for IV fluid rehydration tomorrow afternoon or Friday if she continues with poor oral intake.  Advised patient to call if needed for any reason; or this provider would call and check in with her as well.  Also, confirmed with patient that the labs and the follow-up visit with Dr. Lindi Adie has been canceled for Thursday afternoon, 02/29/2016-since patient was seen just yesterday.  Will also place Magic Mouthwash on pt's allergy list.

## 2016-02-28 NOTE — Assessment & Plan Note (Signed)
Patient received her first cycle of Taxotere/carbon/Herceptin/Perjeta chemotherapy on the center seventh 2017.  She also received Neulasta for growth factor support.  See further notes for details of bronchitis symptoms and diagnosis today.  Also, patient states that she underwent a left breast lumpectomy on 01/17/2016 per Dr. Barry Dienes.  Gen. Psychologist, sport and exercise.  She developed a hematoma at the surgical site; which was removed per Dr. Barry Dienes on 01/25/2016.  Patient also underwent in vitro egg retrieval on 02/05/2016.  X-ray obtained today revealed questionable remnants of the left breast hematoma.  On exam today-there is some trace hematoma palpated to the left breast; but no discoloration/bruise, warmth, tenderness, or red streaks.  Will forward this note and the x-ray findings to Dr. Barry Dienes for her to review as well.  Note: Will cancel the labs and follow up visit scheduled for Thursday, 02/29/2016.  Since patient was seen with labs just today.  Patient will be returning on  03/14/2016 for labs and chemotherapy..  Reviewed all with Dr. Lindi Adie, and he was in agreement with this plan.

## 2016-02-28 NOTE — Progress Notes (Signed)
SYMPTOM MANAGEMENT CLINIC    Chief Complaint: Bronchitis  HPI:  Brianna James 35 y.o. female diagnosed with breast cancer.  Currently undergoing Taxotere/carboplatin/Herceptin/Perjeta chemotherapy therapy regimen.     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  HENT: Positive for congestion and sore throat.   Respiratory: Positive for cough.   Skin:       Left breast hematoma, resolving  All other systems reviewed and are negative.   Past Medical History:  Diagnosis Date  . Cancer ([place]) 01/2016   breast  . Chicken pox   . Hematoma    left breast  . HSV infection   . PONV (postoperative nausea and vomiting)     Past Surgical History:  Procedure Laterality Date  . BREAST LUMPECTOMY WITH RADIOACTIVE SEED AND SENTINEL LYMPH NODE BIOPSY Left 01/17/2016   Procedure: LEFT BREAST LUMPECTOMY WITH RADIOACTIVE SEED AND SENTINEL LYMPH NODE BIOPSY;  Surgeon: [name], MD;  Location: [facility];  Service: General;  Laterality: Left;  . EVACUATION BREAST HEMATOMA Left 01/25/2016   Procedure: WASHOUT LEFT BREAST HEMATOMA;  Surgeon: [name], MD;  Location: [facility];  Service: General;  Laterality: Left;  . PORTACATH PLACEMENT Right 01/17/2016   Procedure: INSERTION PORT-A-CATH WITH [place];  Surgeon: [name], MD;  Location: [facility];  Service: General;  Laterality: Right;  . WISDOM TOOTH EXTRACTION      has Breast cancer of  upper-outer quadrant of left female breast ([place]); Family history of breast cancer in female; Family history of uterine cancer; Bronchitis; and Diarrhea on her problem list.    is allergic to oxycodone and peanuts [peanut oil].    Medication List       Accurate as of 02/27/16 11:59 PM. Always use your most recent med list.          dexamethasone 4 MG tablet Commonly known as:  DECADRON Take 1 tablet (4 mg total) by mouth daily. Take 1 tablet daily before and 1 tablet day after chemotherapy   levofloxacin 500 MG tablet Commonly known as:  LEVAQUIN Take 1 tablet (500 mg total) by mouth daily.   lidocaine-prilocaine cream Commonly known as:  EMLA Apply to affected area once   LORazepam 0.5 MG tablet Commonly known as:  ATIVAN Take 1 tablet (0.5 mg total) by mouth at bedtime. As needed for sleep   magic mouthwash w/lidocaine Soln Take 5 mLs by mouth 4 (four) times daily as needed for mouth pain.   ondansetron 8 MG tablet Commonly known as:  ZOFRAN Take 1 tablet (8 mg total) by mouth 2 (two) times daily as needed for refractory nausea / vomiting. Start on day 3 after chemo.   prochlorperazine 10 MG tablet Commonly known as:  COMPAZINE Take 1 tablet (10 mg total) by mouth every 6 (six) hours as needed (Nausea or vomiting).  PHYSICAL EXAMINATION  Oncology Vitals 02/27/2016 02/22/2016  Height 170 cm -  Weight 98.748 kg -  Weight (lbs) 217 lbs 11 oz -  BMI (kg/m2) 34.1 kg/m2 -  Temp 99.3 99.5  Pulse 101 79  Resp 18 18  SpO2 100 100  BSA (m2) 2.16 m2 -   BP Readings from Last 2 Encounters:  02/27/16 114/65  02/22/16 135/88    Physical Exam  Constitutional: She is oriented to person, place, and time and well-developed, well-nourished, and in no distress.  HENT:  Head: Normocephalic and atraumatic.  Nasal congestion; but no facial tenderness.  Posterior oropharynx with erythema but no exudate.  Eyes: Conjunctivae and EOM are normal. Pupils are equal, round,  and reactive to light. Right eye exhibits no discharge. Left eye exhibits no discharge. No scleral icterus.  Neck: Normal range of motion. Neck supple. No JVD present. No tracheal deviation present. No thyromegaly present.  Cardiovascular: Normal rate, regular rhythm, normal heart sounds and intact distal pulses.   Pulmonary/Chest: Effort normal and breath sounds normal. No respiratory distress. She has no wheezes. She has no rales. She exhibits no tenderness.  Patient has a congested cough.  Abdominal: Soft. Bowel sounds are normal. She exhibits no distension and no mass. There is no tenderness. There is no rebound and no guarding.  Musculoskeletal: Normal range of motion. She exhibits no edema or tenderness.  Lymphadenopathy:    She has no cervical adenopathy.  Neurological: She is alert and oriented to person, place, and time. Gait normal.  Skin: Skin is warm and dry. No rash noted. No erythema. No pallor.  Trace hematoma appears to be resolving to the left breast.  Psychiatric: Affect normal.  Nursing note and vitals reviewed.   LABORATORY DATA:. Appointment on 02/27/2016  Component Date Value Ref Range Status  . WBC 02/27/2016 15.7* 3.9 - 10.3 10e3/uL Final  . NEUT# 02/27/2016 12.7* 1.5 - 6.5 10e3/uL Final  . HGB 02/27/2016 10.0* 11.6 - 15.9 g/dL Final  . HCT 02/27/2016 32.1* 34.8 - 46.6 % Final  . Platelets 02/27/2016 285  145 - 400 10e3/uL Final  . MCV 02/27/2016 75.2* 79.5 - 101.0 fL Final  . MCH 02/27/2016 23.5* 25.1 - 34.0 pg Final  . MCHC 02/27/2016 31.2* 31.5 - 36.0 g/dL Final  . RBC 02/27/2016 4.26  3.70 - 5.45 10e6/uL Final  . RDW 02/27/2016 15.8* 11.2 - 14.5 % Final  . lymph# 02/27/2016 2.3  0.9 - 3.3 10e3/uL Final  . MONO# 02/27/2016 0.3  0.1 - 0.9 10e3/uL Final  . Eosinophils Absolute 02/27/2016 0.1  0.0 - 0.5 10e3/uL Final  . Basophils Absolute 02/27/2016 0.2* 0.0 - 0.1 10e3/uL Final  . NEUT% 02/27/2016 81.0* 38.4 - 76.8 % Final  . LYMPH% 02/27/2016 14.9  14.0 -  49.7 % Final  . MONO% 02/27/2016 2.1  0.0 - 14.0 % Final  . EOS% 02/27/2016 0.7  0.0 - 7.0 % Final  . BASO% 02/27/2016 1.3  0.0 - 2.0 % Final  . Sodium 02/27/2016 137  136 - 145 mEq/L Final  . Potassium 02/27/2016 4.0  3.5 - 5.1 mEq/L Final  . Chloride 02/27/2016 103  98 - 109 mEq/L Final  . CO2 02/27/2016 26  22 - 29 mEq/L Final  . Glucose 02/27/2016 104  70 - 140 mg/dl Final  . BUN 02/27/2016 6.2* 7.0 - 26.0 mg/dL Final  . Creatinine 02/27/2016 0.9  0.6 - 1.1 mg/dL Final  . Total Bilirubin 02/27/2016 0.64  0.20 - 1.20 mg/dL  Final  . Alkaline Phosphatase 02/27/2016 120  40 - 150 U/L Final  . AST 02/27/2016 17  5 - 34 U/L Final  . ALT 02/27/2016 11  0 - 55 U/L Final  . Total Protein 02/27/2016 7.3  6.4 - 8.3 g/dL Final  . Albumin 02/27/2016 3.4* 3.5 - 5.0 g/dL Final  . Calcium 02/27/2016 9.1  8.4 - 10.4 mg/dL Final  . Anion Gap 02/27/2016 8  3 - 11 mEq/L Final  . EGFR 02/27/2016 >90  >90 ml/min/1.73 m2 Final  . Magnesium 02/27/2016 1.9  1.5 - 2.5 mg/dl Final    RADIOGRAPHIC STUDIES: Dg Chest 2 View  Result Date: 02/27/2016 CLINICAL DATA:  Three days of productive cough and sinus congestion. Patient ports aches and pains including neck pain. Several days of loose stools. Patient underwent chemotherapy 3 days ago for breast malignancy. EXAM: CHEST  2 VIEW COMPARISON:  Portable chest x-ray of January 17, 2016 FINDINGS: The lungs are better inflated today there is no infiltrate or atelectasis or pleural effusion. The heart and pulmonary vascularity are normal. The porta catheter tip projects over the midportion of the SVC. The bony thorax is unremarkable. An air-fluid level projects in the left breast. Air in fluid has been seen in this region in the past. There are surrounding vascular clips. IMPRESSION: No evidence of pneumonia nor other acute cardiopulmonary abnormality. There is an air-fluid level in the soft tissues of the left breast likely at the site of a previous hematoma. Correlation  with the patient's clinical exam will be needed to exclude an abscess. Electronically Signed   By: David  Martinique M.D.   On: 02/27/2016 13:11    ASSESSMENT/PLAN:    Diarrhea Patient states that she has been having multiple episodes of diarrhea for the past few days.  She has not tried any over-the-counter Imodium for treatment of the diarrhea.  Patient appears mildly dehydrated today; but was able to drink fluids and eat crackers while at the cancer center.  Patient was advised to start taking Imodium 2 tablets with each loose stool, maximum 8 tablets per day.  Also, patient was encouraged to push fluids at home is much as possible.  Patient was advised to call/return if she feels further dehydrated.  Bronchitis Patient states that she was diagnosed with positive strep throat a few weeks ago; and completed 10 days of amoxicillin.  She states she was feeling better; until she had her first cycle of chemotherapy on 02/22/2016.  She also received Neulasta for growth factor support as well.  She now complains of significant nasal congestion, sore throat, and a productive, congested cough as well.  She has had a low-grade fever with a maximum of 99.3 as well.  Patient denies any GI upset whatsoever.  On exam today.  Breath sounds are clear bilaterally; the patient does have a very congested cough.  She also has significant nasal congestion; but no facial tenderness.  Posterior oropharynx with erythema; no exudate.  Temperature was 99.3 while in the exam room.  Chest x-ray obtained today revealed: IMPRESSION: No evidence of pneumonia nor other acute cardiopulmonary abnormality. There is an air-fluid level in the soft tissues of the left breast likely at the site of a previous hematoma. Correlation with the patient's clinical exam will be needed to exclude an abscess.   Patient will be prescribed Levaquin antibiotics for treatment of bronchitis.  She is also advised to try over-the-counter  Robitussin cough syrup to see if this helps.  She was  advised to call/return or go directly to the emergency department for any worsening symptoms whatsoever.  Breast cancer of upper-outer quadrant of left female breast [facility]) Patient received her first cycle of Taxotere/carbon/Herceptin/Perjeta chemotherapy on the center seventh 2017.  She also received Neulasta for growth factor support.  See further notes for details of bronchitis symptoms and diagnosis today.  Also, patient states that she underwent a left breast lumpectomy on 01/17/2016 per Dr. Barry Dienes.  Gen. Psychologist, sport and exercise.  She developed a hematoma at the surgical site; which was removed per Dr. Barry Dienes on 01/25/2016.  Patient also underwent in vitro egg retrieval on 02/05/2016.  X-ray obtained today revealed questionable remnants of the left breast hematoma.  On exam today-there is some trace hematoma palpated to the left breast; but no discoloration/bruise, warmth, tenderness, or red streaks.  Will forward this note and the x-ray findings to Dr. Barry Dienes for her to review as well.  Note: Will cancel the labs and follow up visit scheduled for Thursday, 02/29/2016.  Since patient was seen with labs just today.  Patient will be returning on  03/14/2016 for labs and chemotherapy..  Reviewed all with Dr. Lindi Adie, and he was in agreement with this plan.   Patient stated understanding of all instructions; and was in agreement with this plan of care. The patient knows to call the clinic with any problems, questions or concerns.   Total time spent with patient was 25 minutes;  with greater than 75 percent of that time spent in face to face counseling regarding patient's symptoms,  and coordination of care and follow up.  Disclaimer:This dictation was prepared with Dragon/digital dictation along with [employer]. Any transcriptional errors that result from this process are unintentional.  Drue Second, NP 02/28/2016

## 2016-02-28 NOTE — Assessment & Plan Note (Signed)
Patient states that she was diagnosed with positive strep throat a few weeks ago; and completed 10 days of amoxicillin.  She states she was feeling better; until she had her first cycle of chemotherapy on 02/22/2016.  She also received Neulasta for growth factor support as well.  She now complains of significant nasal congestion, sore throat, and a productive, congested cough as well.  She has had a low-grade fever with a maximum of 99.3 as well.  Patient denies any GI upset whatsoever.  On exam today.  Breath sounds are clear bilaterally; the patient does have a very congested cough.  She also has significant nasal congestion; but no facial tenderness.  Posterior oropharynx with erythema; no exudate.  Temperature was 99.3 while in the exam room.  Chest x-ray obtained today revealed: IMPRESSION: No evidence of pneumonia nor other acute cardiopulmonary abnormality. There is an air-fluid level in the soft tissues of the left breast likely at the site of a previous hematoma. Correlation with the patient's clinical exam will be needed to exclude an abscess.   Patient will be prescribed Levaquin antibiotics for treatment of bronchitis.  She is also advised to try over-the-counter Robitussin cough syrup to see if this helps.  She was advised to call/return or go directly to the emergency department for any worsening symptoms whatsoever.

## 2016-02-29 ENCOUNTER — Ambulatory Visit: Payer: 59 | Admitting: Hematology and Oncology

## 2016-02-29 ENCOUNTER — Emergency Department (HOSPITAL_COMMUNITY)
Admission: EM | Admit: 2016-02-29 | Discharge: 2016-02-29 | Disposition: A | Discharge status: Home / Self Care | Payer: 59 | Attending: Emergency Medicine | Admitting: Emergency Medicine

## 2016-02-29 ENCOUNTER — Other Ambulatory Visit: Payer: 59

## 2016-02-29 ENCOUNTER — Telehealth: Payer: Self-pay

## 2016-02-29 ENCOUNTER — Encounter: Payer: Self-pay | Admitting: *Deleted

## 2016-02-29 DIAGNOSIS — Z91010 Allergy to peanuts: Secondary | ICD-10-CM | POA: Diagnosis not present

## 2016-02-29 DIAGNOSIS — E860 Dehydration: Secondary | ICD-10-CM | POA: Diagnosis not present

## 2016-02-29 DIAGNOSIS — R197 Diarrhea, unspecified: Secondary | ICD-10-CM | POA: Diagnosis present

## 2016-02-29 DIAGNOSIS — R300 Dysuria: Secondary | ICD-10-CM

## 2016-02-29 DIAGNOSIS — C50412 Malignant neoplasm of upper-outer quadrant of left female breast: Secondary | ICD-10-CM | POA: Diagnosis not present

## 2016-02-29 LAB — URINALYSIS, ROUTINE W REFLEX MICROSCOPIC
Bilirubin Urine: NEGATIVE
GLUCOSE, UA: 50 mg/dL — AB
Ketones, ur: NEGATIVE mg/dL
Nitrite: NEGATIVE
PH: 7 (ref 5.0–8.0)
Protein, ur: NEGATIVE mg/dL
SPECIFIC GRAVITY, URINE: 1.004 — AB (ref 1.005–1.030)

## 2016-02-29 LAB — PREGNANCY, URINE: Preg Test, Ur: NEGATIVE

## 2016-02-29 LAB — I-STAT CHEM 8, ED
BUN: 7 mg/dL (ref 6–20)
CREATININE: 1.1 mg/dL — AB (ref 0.44–1.00)
Calcium, Ion: 1.14 mmol/L — ABNORMAL LOW (ref 1.15–1.40)
Chloride: 98 mmol/L — ABNORMAL LOW (ref 101–111)
Glucose, Bld: 100 mg/dL — ABNORMAL HIGH (ref 65–99)
HEMATOCRIT: 34 % — AB (ref 36.0–46.0)
HEMOGLOBIN: 11.6 g/dL — AB (ref 12.0–15.0)
POTASSIUM: 3.8 mmol/L (ref 3.5–5.1)
Sodium: 136 mmol/L (ref 135–145)
TCO2: 26 mmol/L (ref 0–100)

## 2016-02-29 MED ORDER — SODIUM CHLORIDE 0.9 % IV BOLUS (SEPSIS)
1000.0000 mL | INTRAVENOUS | Status: AC
Start: 2016-02-29 — End: 2016-02-29
  Administered 2016-02-29: 1000 mL via INTRAVENOUS

## 2016-02-29 MED ORDER — SODIUM CHLORIDE 0.9 % IV BOLUS (SEPSIS)
1000.0000 mL | INTRAVENOUS | Status: AC
  Administered 2016-02-29: 1000 mL via INTRAVENOUS

## 2016-02-29 MED ORDER — ACETAMINOPHEN 325 MG PO TABS
650.0000 mg | ORAL_TABLET | Freq: Once | ORAL | Status: AC
  Administered 2016-02-29: 650 mg via ORAL
  Filled 2016-02-29: qty 2

## 2016-02-29 NOTE — ED Provider Notes (Signed)
[facility] DEPT Provider Note   CSN: [id number] Arrival date & time: 02/29/16  0006  By signing my name below, I, Brianna James, attest that this documentation has been prepared under the direction and in the presence of [employer], PA-C. Electronically Signed: Judithe James, ER Scribe. 10/28/2015. 1:09 AM.   History   Chief Complaint Chief Complaint  Patient presents with  . Hematuria  . Chemo Card    HPI HPI Comments: Brianna James is a 35 y.o. female with hx of left IDC with DCIS who presents to the Emergency Department complaining of hematuria, urinary urgency, urinary frequency and worsening dysuria since this morning. She states she passed a dime sized clot from her urethra this morning. This is associated with diffuse pain across her low back. She is currently undergoing treatment for breast cancer. She had her first chemo treatment on December 7th; regimen of Taxotere/carboplatin/Herceptin/Perjeta. She has not had her second treatment yet. She was started on Levaquin yesterday for presumed bronchitis. She c/o a mild, intermittent cough. Previous to that she had a 10 day course of amoxicillin for strep throat. She has taken two doses of Levaquin so far. She took Dayquil at 4:30pm today. She has had persistent nausea and diarrhea since she started chemo.   Oncologist - Dr. Lindi Adie   Past Medical History:  Diagnosis Date  . Cancer ([place]) 01/2016   breast  . Chicken pox   . Hematoma    left breast  . HSV infection   . PONV (postoperative nausea and vomiting)     Patient Active Problem List   Diagnosis Date Noted  . Bronchitis 02/28/2016  . Diarrhea 02/28/2016  . Family history of breast cancer in female 01/04/2016  . Family history of uterine cancer 01/04/2016  . Breast cancer of upper-outer quadrant of left female breast ([place]) 01/03/2016    Past Surgical History:  Procedure Laterality Date  . BREAST LUMPECTOMY WITH RADIOACTIVE SEED AND SENTINEL LYMPH  NODE BIOPSY Left 01/17/2016   Procedure: LEFT BREAST LUMPECTOMY WITH RADIOACTIVE SEED AND SENTINEL LYMPH NODE BIOPSY;  Surgeon: [name], MD;  Location: [facility];  Service: General;  Laterality: Left;  . EVACUATION BREAST HEMATOMA Left 01/25/2016   Procedure: WASHOUT LEFT BREAST HEMATOMA;  Surgeon: [name], MD;  Location: [facility];  Service: General;  Laterality: Left;  . PORTACATH PLACEMENT Right 01/17/2016   Procedure: INSERTION PORT-A-CATH WITH [place];  Surgeon: [name], MD;  Location: [facility];  Service: General;  Laterality: Right;  . WISDOM TOOTH EXTRACTION      OB History    Gravida Para Term Preterm AB Living   1 1 1     1    SAB TAB Ectopic Multiple Live Births         0 1       Home Medications    Prior to Admission medications   Medication Sig Start Date End Date Taking? Authorizing Provider  dexamethasone (DECADRON) 4 MG tablet Take 1 tablet (4 mg total) by mouth daily. Take 1 tablet daily before and 1 tablet day after chemotherapy 02/02/16  Yes Nicholas Lose, MD  levofloxacin (LEVAQUIN) 500 MG tablet Take 1 tablet (500 mg total) by mouth daily. 02/27/16  Yes Susanne Borders, NP  lidocaine-prilocaine (EMLA) cream Apply to affected area once 02/02/16  Yes Nicholas Lose, MD  loratadine (CLARITIN) 10 MG tablet Take 10 mg by mouth daily.   Yes Historical Provider, MD  LORazepam (ATIVAN)  0.5 MG tablet Take 1 tablet (0.5 mg total) by mouth at bedtime. As needed for sleep 02/02/16  Yes Nicholas Lose, MD  ondansetron (ZOFRAN) 8 MG tablet Take 1 tablet (8 mg total) by mouth 2 (two) times daily as needed for refractory nausea / vomiting. Start on day 3 after chemo. 02/02/16  Yes Nicholas Lose, MD  prochlorperazine (COMPAZINE) 10 MG tablet Take 1 tablet (10 mg total) by mouth every 6 (six) hours as needed (Nausea or vomiting). 02/02/16  Yes Nicholas Lose, MD  Pseudoeph-Doxylamine-DM-APAP (NYQUIL PO) Take 1 capsule by mouth at bedtime.    Yes Historical Provider, MD  Pseudoephedrine-APAP-DM (DAYQUIL PO) Take 1 capsule by mouth as needed (cold).   Yes Historical Provider, MD  magic mouthwash w/lidocaine SOLN Take 5 mLs by mouth 4 (four) times daily as needed for mouth pain. Patient not taking: Reported on 02/29/2016 02/27/16   Susanne Borders, NP    Family History Family History  Problem Relation Age of Onset  . Arthritis Mother   . Stroke Mother   . Stroke Maternal Aunt   . Stroke Maternal Uncle   . Breast cancer Maternal Grandmother     dx at 21 or younger; d. 24y  . Lung cancer Maternal Grandfather   . Stroke Paternal Grandmother   . Uterine cancer Paternal Grandmother     dx. 61-62  . Other Paternal Grandmother     colostomy bag in her 53s; unspecified reason  . Lung cancer Paternal Grandfather   . Uterine cancer Maternal Aunt     dx unspecified age  . Breast cancer Other     maternal great grandmother (MGM's mother) d. 60    Social History Social History  Substance Use Topics  . Smoking status: Never Smoker  . Smokeless tobacco: Never Used  . Alcohol use 0.0 oz/week     Comment: rare - wine maybe 2x per yr     Allergies   Other; Oxycodone; and Peanuts [peanut oil]   Review of Systems Review of Systems A complete 10 system review of systems was obtained and all systems are negative except as noted in the HPI and PMH.    Physical Exam Updated Vital Signs BP 111/64 (BP Location: Right Arm)   Pulse 90   Temp 98 F (36.7 C) (Oral)   Resp 20   LMP 02/16/2016   SpO2 97%   Physical Exam  Constitutional: She is oriented to person, place, and time. She appears well-developed and well-nourished. No distress.  Pleasant; nontoxic appearing  HENT:  Head: Normocephalic and atraumatic.  Eyes: Conjunctivae and EOM are normal. No scleral icterus.  Neck: Normal range of motion.  Cardiovascular: Normal rate, regular rhythm and intact distal pulses.   Pulmonary/Chest: Effort normal. No respiratory  distress. She has no wheezes. She has no rales.  Respirations even and unlabored. Lungs CTAB.  Abdominal: Soft. She exhibits no distension and no mass. There is no guarding.  No focal abdominal TTP. No masses. No CVA TTP. No peritoneal signs.  Musculoskeletal: Normal range of motion.  Neurological: She is alert and oriented to person, place, and time. She exhibits normal muscle tone. Coordination normal.  GCS 15. Patient moving all extremities.  Skin: Skin is warm and dry. No rash noted. She is not diaphoretic. No erythema. No pallor.  Psychiatric: She has a normal mood and affect. Her behavior is normal.  Nursing note and vitals reviewed.    ED Treatments / Results  Labs (all labs ordered are listed,  but only abnormal results are displayed) Labs Reviewed  URINALYSIS, ROUTINE W REFLEX MICROSCOPIC - Abnormal; Notable for the following:       Result Value   Color, Urine STRAW (*)    APPearance HAZY (*)    Specific Gravity, Urine 1.004 (*)    Glucose, UA 50 (*)    Hgb urine dipstick LARGE (*)    Leukocytes, UA MODERATE (*)    Bacteria, UA RARE (*)    Squamous Epithelial / LPF 6-30 (*)    All other components within normal limits  I-STAT CHEM 8, ED - Abnormal; Notable for the following:    Chloride 98 (*)    Creatinine, Ser 1.10 (*)    Glucose, Bld 100 (*)    Calcium, Ion 1.14 (*)    Hemoglobin 11.6 (*)    HCT 34.0 (*)    All other components within normal limits  URINE CULTURE  PREGNANCY, URINE    EKG  EKG Interpretation None       Radiology Dg Chest 2 View  Result Date: 02/27/2016 CLINICAL DATA:  Three days of productive cough and sinus congestion. Patient ports aches and pains including neck pain. Several days of loose stools. Patient underwent chemotherapy 3 days ago for breast malignancy. EXAM: CHEST  2 VIEW COMPARISON:  Portable chest x-ray of January 17, 2016 FINDINGS: The lungs are better inflated today there is no infiltrate or atelectasis or pleural effusion.  The heart and pulmonary vascularity are normal. The porta catheter tip projects over the midportion of the SVC. The bony thorax is unremarkable. An air-fluid level projects in the left breast. Air in fluid has been seen in this region in the past. There are surrounding vascular clips. IMPRESSION: No evidence of pneumonia nor other acute cardiopulmonary abnormality. There is an air-fluid level in the soft tissues of the left breast likely at the site of a previous hematoma. Correlation with the patient's clinical exam will be needed to exclude an abscess. Electronically Signed   By: David  Martinique M.D.   On: 02/27/2016 13:11    Procedures Procedures (including critical care time)  Medications Ordered in ED Medications  sodium chloride 0.9 % bolus 1,000 mL (0 mLs Intravenous Stopped 02/29/16 0533)  acetaminophen (TYLENOL) tablet 650 mg (650 mg Oral Given 02/29/16 0534)  sodium chloride 0.9 % bolus 1,000 mL (1,000 mLs Intravenous New Bag/Given 02/29/16 0534)     Initial Impression / Assessment and Plan / ED Course  I have reviewed the triage vital signs and the nursing notes.  Pertinent labs & imaging results that were available during my care of the patient were reviewed by me and considered in my medical decision making (see chart for details).  Clinical Course     35 year old female with a history of left breast cancer presents to the emergency department for evaluation of dysuria with hematuria and back pain. She reports passing a dime sized clot yesterday. She denies any vaginal bleeding or spotting. Patient's dysuria has improved with IV fluids. Her urinalysis does not suggest infection pregnancy is negative. No evidence of microscopic hematuria. Urine was sent for culture, though results more consistent with contamination. She has a stable hemoglobin and platelet count from 2 days ago was normal. No hypotension, tachycardia, or fever today.  Patient is currently taking Levaquin for  coverage of presumed bronchitis. Case discussed with Dr. Earlie Server of oncology who believes that the patient's workup is reassuring enough for her to follow up with her oncologist on an outpatient  basis. Patient is comfortable with this plan and reliable for follow-up. Return precautions discussed and provided. Patient discharged in stable condition with no unaddressed concerns.   Final Clinical Impressions(s) / ED Diagnoses   Final diagnoses:  Dysuria  Dehydration, mild    New Prescriptions New Prescriptions   No medications on file    I personally performed the services described in this documentation, which was scribed in my presence. The recorded information has been reviewed and is accurate.          Antonietta Breach, PA-C 02/29/16 [id number]    Charlesetta Shanks, MD 02/29/16 0800

## 2016-02-29 NOTE — Discharge Instructions (Signed)
We thank you for your patience today!! Continue taking your Levaquin as prescribed. You may also take Tylenol for pain. Follow-up with your oncologist regarding your visit today. You will be notified if your urine culture is positive for infection and not appropriately treated with the antibiotic you are currently taking. Continue taking Levaquin until finished. You may return for new or concerning symptoms.

## 2016-02-29 NOTE — Telephone Encounter (Signed)
Called pt to f/u on her symptoms regarding hematuria last night. Pt states that she had just been discharged from the ED this morning. They gave her 2L of IVF's and ran some test to check for UTI or kidney stones and was all clear. Pt states that she noticed blood in her urine and passing of clot one time when she went to urinate last night. Pt states that this was associated with back pain as well. Pt also nauseated and unable to eat very well. Pt states that she wasn't hydrating as much as she should at home, and not taking her nausea pill too much.Pt status post 1st time tchp. Reinforced education on post chemo management at home with hydration, taking her nausea medicatons timely, and choices of food to eat during the first days and week after chemo. Also, offered pt to come in for IVF's tomorrow if she feels that she is not able to tolerate anything orally. Suggested that pt may take tylenol or motrin to help with pain during urination. Also suggested that taking cranberry juice or supplements can help maintain her urinary tract health. Told pt to report any further bleeding, fever, extreme fatigue, and uncontrolled n/v. PT voiced understanding and will call tomorrow if she needs to come in for ivf's.

## 2016-02-29 NOTE — ED Triage Notes (Signed)
Pt states that she is a breast CA pt and has had blood in her urine and dysuria since this morning. States that she can urinate but it is very painful to do so. Last chemo on 12/7. Alert and oriented.

## 2016-03-01 ENCOUNTER — Telehealth: Payer: Self-pay | Admitting: *Deleted

## 2016-03-01 NOTE — Telephone Encounter (Signed)
TCT patient to follow with visit to [facility] on 02/27/16 for bronchitis.  Spoke with patient. She sates she was in the ED for possible UTI on 02/29/16.  Pt states she is on AZO for that for 2 days. And is pushing her oral fluid intake. She states that she is feeling better overall regarding her bronchitis. She has been on Levaquin for 3 days now.  She states her coughing has diminished significantly.  U/A from 02/29/16 shown to Selena Lesser, NP. Pt with probable UTI. Levaquin should cover both bronchitis and UTI. Pt made aware of this.  Encouraged to continue pushing fluids and to call us if she does not continue to improve in all areas.  Advised pt that I would call back on Monday, 03/04/16 for another f/u. Pt expressed gratitude for calls.

## 2016-03-02 LAB — URINE CULTURE: Culture: 50000 — AB

## 2016-03-03 ENCOUNTER — Telehealth: Payer: Self-pay

## 2016-03-03 DIAGNOSIS — Z1379 Encounter for other screening for genetic and chromosomal anomalies: Secondary | ICD-10-CM | POA: Insufficient documentation

## 2016-03-03 NOTE — Telephone Encounter (Signed)
Post ED Visit - Positive Culture Follow-up  Culture report reviewed by antimicrobial stewardship pharmacist:  []  Elenor Quinones, Pharm.D. []  Heide Guile, Pharm.D., BCPS []  Parks Neptune, Pharm.D. []  Alycia Rossetti, Pharm.D., BCPS []  [place], [place].D., BCPS, AAHIVP []  Legrand Como, Pharm.D., BCPS, AAHIVP []  Milus Glazier, Pharm.D. []  Stephens November, Pharm.D. Ebony Hail Masters Pharm D Positive urine culture Treated with Levaquin, organism sensitive to the same and no further patient follow-up is required at this time.  Genia Del 03/03/2016, 1:47 PM

## 2016-03-03 NOTE — Progress Notes (Signed)
GENETIC TEST RESULT  HPI: Ms. Bredeson was previously seen in the [place] clinic due to a personal and family history of breast cancer and concerns regarding a hereditary predisposition to cancer. Please refer to our prior cancer genetics clinic note from January 04, 2016 for more information regarding Ms. Heinemann medical, social and family histories, and our assessment and recommendations, at the time. Ms. Balash recent genetic test results were disclosed to her, as were recommendations warranted by these results. These results and recommendations are discussed in more detail below.  GENETIC TEST RESULTS: Genetic testing reported out on January 29, 2016 through the [place] Panel (with MSH2 Exons 1-7 Inversion Analysis) found no deleterious mutations.  Additionally, no variants of uncertain significance (VUSes) were found. The Breast/Ovarian Cancer Panel offered by [facility] Hope Pigeon, MD) includes sequencing and deletion/duplication analysis for the following 19 genes:  ATM, BARD1, BRCA1, BRCA2, BRIP1, CDH1, CHEK2, FANCC, MLH1, MSH2, MSH6, NBN, PALB2, PMS2, PTEN, RAD51C, RAD51D, TP53, and XRCC2.  This panel also includes deletion/duplication analysis (without sequencing) for one gene, EPCAM.  The test report will be scanned into EPIC and will be located under the Molecular Pathology section of the Results Review tab.  We discussed with Ms. Stenberg that since the current genetic testing is not perfect, it is possible there may be a gene mutation in one of these genes that current testing cannot detect, but that chance is small. We also discussed, that it is possible that another gene that has not yet been discovered, or that we have not yet tested, is responsible for the cancer diagnoses in the family, and it is, therefore, important to remain in touch with cancer genetics in the future so that we can continue to offer Ms. Thibeaux the most up-to-date  genetic testing.   CANCER SCREENING RECOMMENDATIONS: We still do not have an explanation for the personal and family history of breast cancer.  This result may be reassuring and indicate that Ms. Haman likely does not have an increased risk for a future cancer due to a mutation in one of these genes. This normal test also suggests that Ms. Crowl cancer was most likely not due to an inherited predisposition associated with one of these genes.  Most cancers happen by chance and this negative test suggests that her cancer falls into this category.  However, given Ms. Mahadeo personal and family histories, we must interpret these negative results with some caution.  Families with features suggestive of hereditary risk for cancer tend to have multiple family members with cancer, diagnoses in multiple generations and diagnoses before the age of 33. Ms. Dobis family exhibits some of these features. Thus this result may simply reflect our current inability to detect all mutations within these genes or there may be a different gene that has not yet been discovered or tested.    Other maternal relatives are likely also eligible for genetic counseling and testing, if interested.  We encouraged Ms. Blane to keep in touch with [place] and update [place] on the family history, as well as to check back about potential future updated genetic testing options.  In the meantime, we recommended she continue to follow the cancer management and screening guidelines provided by her oncology and primary healthcare providers.   RECOMMENDATIONS FOR FAMILY MEMBERS: Women in this family might be at some increased risk of developing cancer, over the general population risk, simply due to the family history of cancer. We recommended women in this family  have a yearly mammogram beginning at age 99, or 68 years younger than the earliest onset of cancer, an annual clinical breast exam, and perform monthly breast self-exams. Ms. Pellot sisters  and nieces are eligible for annual mammograms or breast MRIs beginning at age 25.  Women in this family should also have a gynecological exam as recommended by their primary provider. All family members should have a colonoscopy by age 5.  Based on Ms. Jeon family history, we discussed that other maternal relatives are likely also eligible for genetic counseling and testing. Ms. Totty will let us know if we can be of any assistance in coordinating genetic counseling and/or testing for these family members.   FOLLOW-UP: Lastly, we discussed with Ms. Hattabaugh that cancer genetics is a rapidly advancing field and it is possible that new genetic tests will be appropriate for her and/or her family members in the future. We encouraged her to remain in contact with cancer genetics on an annual basis so we can update her personal and family histories and let her know of advances in cancer genetics that may benefit this family.   Our contact number was provided. Ms. Devera questions were answered to her satisfaction, and she knows she is welcome to call us at anytime with additional questions or concerns.   Jeanine Luz, MS, [facility] Certified Genetic Counselor [email or web address] Phone: [phone]

## 2016-03-04 ENCOUNTER — Telehealth: Payer: Self-pay | Admitting: *Deleted

## 2016-03-04 NOTE — Telephone Encounter (Signed)
TCT patient this am to follow up on her URI. UTI. Spoke with patient and she states that she is feeling much better. She has stopped the AZO and is not having any difficulty passing her urine. Denies fevers/chills. Has residual cough but feels well overall. Next chemo appt is 03/14/16 and then she sees Dr. Lindi Adie in January 2018

## 2016-03-14 ENCOUNTER — Other Ambulatory Visit (HOSPITAL_BASED_OUTPATIENT_CLINIC_OR_DEPARTMENT_OTHER): Payer: 59

## 2016-03-14 ENCOUNTER — Ambulatory Visit (HOSPITAL_BASED_OUTPATIENT_CLINIC_OR_DEPARTMENT_OTHER): Payer: 59

## 2016-03-14 VITALS — BP 111/76 | HR 93 | Temp 98.6°F | Resp 18

## 2016-03-14 DIAGNOSIS — C50412 Malignant neoplasm of upper-outer quadrant of left female breast: Secondary | ICD-10-CM

## 2016-03-14 DIAGNOSIS — Z171 Estrogen receptor negative status [ER-]: Principal | ICD-10-CM

## 2016-03-14 DIAGNOSIS — Z5189 Encounter for other specified aftercare: Secondary | ICD-10-CM

## 2016-03-14 DIAGNOSIS — Z5112 Encounter for antineoplastic immunotherapy: Secondary | ICD-10-CM | POA: Diagnosis not present

## 2016-03-14 DIAGNOSIS — Z5111 Encounter for antineoplastic chemotherapy: Secondary | ICD-10-CM

## 2016-03-14 LAB — CBC WITH DIFFERENTIAL/PLATELET
BASO%: 0 % (ref 0.0–2.0)
Basophils Absolute: 0 10*3/uL (ref 0.0–0.1)
EOS%: 0 % (ref 0.0–7.0)
Eosinophils Absolute: 0 10*3/uL (ref 0.0–0.5)
HCT: 31.2 % — ABNORMAL LOW (ref 34.8–46.6)
HEMOGLOBIN: 9.8 g/dL — AB (ref 11.6–15.9)
LYMPH%: 12.5 % — AB (ref 14.0–49.7)
MCH: 24 pg — AB (ref 25.1–34.0)
MCHC: 31.4 g/dL — ABNORMAL LOW (ref 31.5–36.0)
MCV: 76.3 fL — AB (ref 79.5–101.0)
MONO#: 0.1 10*3/uL (ref 0.1–0.9)
MONO%: 1.2 % (ref 0.0–14.0)
NEUT#: 8 10*3/uL — ABNORMAL HIGH (ref 1.5–6.5)
NEUT%: 86.3 % — ABNORMAL HIGH (ref 38.4–76.8)
Platelets: 533 10*3/uL — ABNORMAL HIGH (ref 145–400)
RBC: 4.09 10*6/uL (ref 3.70–5.45)
RDW: 17.6 % — AB (ref 11.2–14.5)
WBC: 9.3 10*3/uL (ref 3.9–10.3)
lymph#: 1.2 10*3/uL (ref 0.9–3.3)

## 2016-03-14 LAB — COMPREHENSIVE METABOLIC PANEL
ALT: 21 U/L (ref 0–55)
ANION GAP: 10 meq/L (ref 3–11)
AST: 24 U/L (ref 5–34)
Albumin: 3.8 g/dL (ref 3.5–5.0)
Alkaline Phosphatase: 86 U/L (ref 40–150)
BILIRUBIN TOTAL: 0.26 mg/dL (ref 0.20–1.20)
BUN: 9.5 mg/dL (ref 7.0–26.0)
CALCIUM: 9.5 mg/dL (ref 8.4–10.4)
CO2: 24 meq/L (ref 22–29)
CREATININE: 0.9 mg/dL (ref 0.6–1.1)
Chloride: 102 mEq/L (ref 98–109)
EGFR: >90 mL/min/{1.73_m2} (ref >90)
Glucose: 150 mg/dl — ABNORMAL HIGH (ref 70–140)
Potassium: 4.3 mEq/L (ref 3.5–5.1)
Sodium: 135 mEq/L — ABNORMAL LOW (ref 136–145)
TOTAL PROTEIN: 7.4 g/dL (ref 6.4–8.3)

## 2016-03-14 MED ORDER — DIPHENHYDRAMINE HCL 25 MG PO CAPS
50.0000 mg | ORAL_CAPSULE | Freq: Once | ORAL | Status: AC
  Administered 2016-03-14: 50 mg via ORAL

## 2016-03-14 MED ORDER — ACETAMINOPHEN 325 MG PO TABS
ORAL_TABLET | ORAL | Status: AC
  Filled 2016-03-14: qty 2

## 2016-03-14 MED ORDER — SODIUM CHLORIDE 0.9% FLUSH
10.0000 mL | INTRAVENOUS | Status: DC | PRN
  Administered 2016-03-14: 10 mL
  Filled 2016-03-14: qty 10

## 2016-03-14 MED ORDER — DEXAMETHASONE SODIUM PHOSPHATE 10 MG/ML IJ SOLN
INTRAMUSCULAR | Status: AC
  Filled 2016-03-14: qty 1

## 2016-03-14 MED ORDER — DIPHENHYDRAMINE HCL 25 MG PO CAPS
ORAL_CAPSULE | ORAL | Status: AC
  Filled 2016-03-14: qty 2

## 2016-03-14 MED ORDER — ACETAMINOPHEN 325 MG PO TABS
650.0000 mg | ORAL_TABLET | Freq: Once | ORAL | Status: AC
  Administered 2016-03-14: 650 mg via ORAL

## 2016-03-14 MED ORDER — PALONOSETRON HCL INJECTION 0.25 MG/5ML
0.2500 mg | Freq: Once | INTRAVENOUS | Status: AC
  Administered 2016-03-14: 0.25 mg via INTRAVENOUS

## 2016-03-14 MED ORDER — SODIUM CHLORIDE 0.9 % IV SOLN
750.0000 mg | Freq: Once | INTRAVENOUS | Status: AC
  Administered 2016-03-14: 750 mg via INTRAVENOUS
  Filled 2016-03-14: qty 75

## 2016-03-14 MED ORDER — PALONOSETRON HCL INJECTION 0.25 MG/5ML
INTRAVENOUS | Status: AC
  Filled 2016-03-14: qty 5

## 2016-03-14 MED ORDER — HEPARIN SOD (PORK) LOCK FLUSH 100 UNIT/ML IV SOLN
500.0000 [IU] | Freq: Once | INTRAVENOUS | Status: AC | PRN
  Administered 2016-03-14: 500 [IU]
  Filled 2016-03-14: qty 5

## 2016-03-14 MED ORDER — DEXAMETHASONE SODIUM PHOSPHATE 10 MG/ML IJ SOLN
10.0000 mg | Freq: Once | INTRAMUSCULAR | Status: AC
  Administered 2016-03-14: 10 mg via INTRAVENOUS

## 2016-03-14 MED ORDER — DOCETAXEL CHEMO INJECTION 160 MG/16ML
75.0000 mg/m2 | Freq: Once | INTRAVENOUS | Status: AC
  Administered 2016-03-14: 160 mg via INTRAVENOUS
  Filled 2016-03-14: qty 16

## 2016-03-14 MED ORDER — PEGFILGRASTIM 6 MG/0.6ML ~~LOC~~ PSKT
6.0000 mg | PREFILLED_SYRINGE | Freq: Once | SUBCUTANEOUS | Status: AC
  Administered 2016-03-14: 6 mg via SUBCUTANEOUS
  Filled 2016-03-14: qty 0.6

## 2016-03-14 MED ORDER — TRASTUZUMAB CHEMO 150 MG IV SOLR
6.0000 mg/kg | Freq: Once | INTRAVENOUS | Status: AC
  Administered 2016-03-14: 588 mg via INTRAVENOUS
  Filled 2016-03-14: qty 28

## 2016-03-14 MED ORDER — SODIUM CHLORIDE 0.9 % IV SOLN
Freq: Once | INTRAVENOUS | Status: AC
  Administered 2016-03-14: 13:00:00 via INTRAVENOUS

## 2016-03-14 MED ORDER — PERTUZUMAB CHEMO INJECTION 420 MG/14ML
420.0000 mg | Freq: Once | INTRAVENOUS | Status: AC
  Administered 2016-03-14: 420 mg via INTRAVENOUS
  Filled 2016-03-14: qty 14

## 2016-03-14 NOTE — Patient Instructions (Signed)
[facility] Discharge Instructions for Patients Receiving Chemotherapy  Today you received the following chemotherapy agents Herceptin/Perjeta/Docetaxel/Carboplatin.   To help prevent nausea and vomiting after your treatment, we encourage you to take your nausea medication as directed.    If you develop nausea and vomiting that is not controlled by your nausea medication, call the clinic.   BELOW ARE SYMPTOMS THAT SHOULD BE REPORTED IMMEDIATELY:  *FEVER GREATER THAN 100.5 F  *CHILLS WITH OR WITHOUT FEVER  NAUSEA AND VOMITING THAT IS NOT CONTROLLED WITH YOUR NAUSEA MEDICATION  *UNUSUAL SHORTNESS OF BREATH  *UNUSUAL BRUISING OR BLEEDING  TENDERNESS IN MOUTH AND THROAT WITH OR WITHOUT PRESENCE OF ULCERS  *URINARY PROBLEMS  *BOWEL PROBLEMS  UNUSUAL RASH Items with * indicate a potential emergency and should be followed up as soon as possible.  Feel free to call the clinic you have any questions or concerns. The clinic phone number is [phone].  Please show the [place] at check-in to the Emergency Department and triage nurse.

## 2016-03-22 ENCOUNTER — Telehealth: Payer: Self-pay | Admitting: Emergency Medicine

## 2016-03-22 NOTE — Telephone Encounter (Signed)
Received voicemail from patient routed from triage that patient is complaining of urinary sx, diarrhea, nosebleed and hematuria.   Attempt to call patient; no answer. Will follow up next business day.

## 2016-03-25 ENCOUNTER — Ambulatory Visit (HOSPITAL_BASED_OUTPATIENT_CLINIC_OR_DEPARTMENT_OTHER): Payer: 59

## 2016-03-25 ENCOUNTER — Other Ambulatory Visit: Payer: Self-pay | Admitting: *Deleted

## 2016-03-25 ENCOUNTER — Telehealth: Payer: Self-pay | Admitting: *Deleted

## 2016-03-25 ENCOUNTER — Ambulatory Visit (HOSPITAL_BASED_OUTPATIENT_CLINIC_OR_DEPARTMENT_OTHER): Payer: 59 | Admitting: Nurse Practitioner

## 2016-03-25 ENCOUNTER — Encounter: Payer: Self-pay | Admitting: Nurse Practitioner

## 2016-03-25 VITALS — BP 113/67 | HR 91 | Temp 98.6°F | Resp 17 | Ht 67.0 in | Wt 226.3 lb

## 2016-03-25 DIAGNOSIS — R197 Diarrhea, unspecified: Secondary | ICD-10-CM | POA: Diagnosis not present

## 2016-03-25 DIAGNOSIS — R319 Hematuria, unspecified: Secondary | ICD-10-CM

## 2016-03-25 DIAGNOSIS — C50412 Malignant neoplasm of upper-outer quadrant of left female breast: Secondary | ICD-10-CM

## 2016-03-25 DIAGNOSIS — N390 Urinary tract infection, site not specified: Secondary | ICD-10-CM | POA: Diagnosis not present

## 2016-03-25 LAB — URINALYSIS, MICROSCOPIC - CHCC
Glucose: NEGATIVE mg/dL
KETONES: NEGATIVE mg/dL
Nitrite: POSITIVE
Specific Gravity, Urine: 1.01 (ref 1.003–1.035)
Urobilinogen, UR: 0.2 mg/dL (ref 0.2–1)
pH: 7.5 (ref 4.6–8.0)

## 2016-03-25 LAB — CBC WITH DIFFERENTIAL/PLATELET
BASO%: 0.1 % (ref 0.0–2.0)
Basophils Absolute: 0 10*3/uL (ref 0.0–0.1)
EOS%: 0 % (ref 0.0–7.0)
Eosinophils Absolute: 0 10*3/uL (ref 0.0–0.5)
HEMATOCRIT: 31.9 % — AB (ref 34.8–46.6)
HGB: 10.1 g/dL — ABNORMAL LOW (ref 11.6–15.9)
LYMPH#: 3.1 10*3/uL (ref 0.9–3.3)
LYMPH%: 19.8 % (ref 14.0–49.7)
MCH: 24.2 pg — ABNORMAL LOW (ref 25.1–34.0)
MCHC: 31.7 g/dL (ref 31.5–36.0)
MCV: 76.5 fL — ABNORMAL LOW (ref 79.5–101.0)
MONO#: 0.8 10*3/uL (ref 0.1–0.9)
MONO%: 4.8 % (ref 0.0–14.0)
NEUT#: 11.7 10*3/uL — ABNORMAL HIGH (ref 1.5–6.5)
NEUT%: 75.3 % (ref 38.4–76.8)
Platelets: 174 10*3/uL (ref 145–400)
RBC: 4.17 10*6/uL (ref 3.70–5.45)
RDW: 18.4 % — ABNORMAL HIGH (ref 11.2–14.5)
WBC: 15.6 10*3/uL — ABNORMAL HIGH (ref 3.9–10.3)

## 2016-03-25 LAB — COMPREHENSIVE METABOLIC PANEL
ALT: 28 U/L (ref 0–55)
AST: 23 U/L (ref 5–34)
Albumin: 3.7 g/dL (ref 3.5–5.0)
Alkaline Phosphatase: 131 U/L (ref 40–150)
Anion Gap: 7 mEq/L (ref 3–11)
BUN: 8.9 mg/dL (ref 7.0–26.0)
CHLORIDE: 103 meq/L (ref 98–109)
CO2: 28 meq/L (ref 22–29)
CREATININE: 0.9 mg/dL (ref 0.6–1.1)
Calcium: 9.3 mg/dL (ref 8.4–10.4)
EGFR: >90 mL/min/{1.73_m2} (ref >90)
GLUCOSE: 94 mg/dL (ref 70–140)
Potassium: 4.1 mEq/L (ref 3.5–5.1)
Sodium: 138 mEq/L (ref 136–145)
Total Bilirubin: 0.22 mg/dL (ref 0.20–1.20)
Total Protein: 7.1 g/dL (ref 6.4–8.3)

## 2016-03-25 LAB — MAGNESIUM: MAGNESIUM: 1.8 mg/dL (ref 1.5–2.5)

## 2016-03-25 MED ORDER — LEVOFLOXACIN 500 MG PO TABS: 500.0000 mg | ORAL_TABLET | Freq: Every day | ORAL | 0 refills | Status: DC

## 2016-03-25 NOTE — Assessment & Plan Note (Signed)
Patient states that she always has a few episodes of diarrhea following her chemotherapy.  She states that the diarrhea is resolved when she takes 2 of the Imodium tablets.  She does not feel dehydrated today.She does state that the diarrhea caused her to have an irritated rash to her buttocks which has now resolved.  Brief exam of this area to the buttocks reveals that rash has resolved completely; but there is some darkened skin to the area now.

## 2016-03-25 NOTE — Assessment & Plan Note (Signed)
Patient presents to the [facility] today with complaint of dysuria and urinary frequency.  She reports some vague pelvic discomfort as well.  She denies any flank pain.  She denies any recent fevers or chills.Patient states she has history of UTIs.  She was treated for UTI in mid December 2017 as well.Urinalysis obtained today was positive for nitrates.  With Kinevac of patient's most recent UTI in December-the urine culture was sensitive to Levaquin.  At that time.  We'll prescribe Levaquin a can for treatment of UTI symptoms; while awaiting urine culture results.Patient was advised to call/return or go directly to the emergency department for any worsening symptoms whatsoever.

## 2016-03-25 NOTE — Telephone Encounter (Signed)
"  I called Friday for urination issues pain, burning, frequency, drips of blood and on tissue.  I drink at least 90 oz daily.  I also have diarrhea five to six times daily.  Use Imodium so it's slowing down.  My skin burns, turned black and a rash with the stools.  Random nose bleeds. Could my platelets be low?" Carboplatin, Taxotere, Perjetta, Herceptin received 03-14-2016.  [facility] evaluation order sent to scheduling.  Patient asked for afternoon appointment.Marland Kitchen

## 2016-03-25 NOTE — Assessment & Plan Note (Signed)
Patient received cycle 2 of her Taxotere/carboplatin/Herceptin/Perjeta chemotherapy on 03/14/2016.  She is scheduled to return on 04/04/2016 for labs, visit, and her next cycle of chemotherapy.

## 2016-03-25 NOTE — Progress Notes (Signed)
SYMPTOM MANAGEMENT CLINIC    Chief Complaint: UTI  HPI:  Brianna James 36 y.o. female diagnosed with breast cancer.  Currently undergoing texture./Carboplatin/Herceptin/Perjeta chemotherapy regimen.     Breast cancer of upper-outer quadrant of left female breast ([place])   12/19/2015 Initial Diagnosis    Left breast biopsy: IDC with DCI S, grade 3, ER 0%, PR 0%, KIC system 40%, her 2 positive ratio 6.15, copy number 16.5; palpable lump left breast 1.6 cm irregular oval mass, T1cN0 stage I a clinical stage      01/17/2016 Surgery    Left Lumpectomy: IDC 4.1 cm with DCIS, Margin 0.1 cm, 1/3 LN Positive, ER/PR Neg, Her 2 Pos with Ki 67 of 40%, T2N1 (stage 2B)       Review of Systems  Gastrointestinal: Positive for diarrhea.  Genitourinary: Positive for dysuria, frequency and urgency.  All other systems reviewed and are negative.   Past Medical History:  Diagnosis Date  . Cancer ([place]) 01/2016   breast  . Chicken pox   . Hematoma    left breast  . HSV infection   . PONV (postoperative nausea and vomiting)     Past Surgical History:  Procedure Laterality Date  . BREAST LUMPECTOMY WITH RADIOACTIVE SEED AND SENTINEL LYMPH NODE BIOPSY Left 01/17/2016   Procedure: LEFT BREAST LUMPECTOMY WITH RADIOACTIVE SEED AND SENTINEL LYMPH NODE BIOPSY;  Surgeon: [name], MD;  Location: [facility];  Service: General;  Laterality: Left;  . EVACUATION BREAST HEMATOMA Left 01/25/2016   Procedure: WASHOUT LEFT BREAST HEMATOMA;  Surgeon: [name], MD;  Location: [facility];  Service: General;  Laterality: Left;  . PORTACATH PLACEMENT Right 01/17/2016   Procedure: INSERTION PORT-A-CATH WITH [place];  Surgeon: [name], MD;  Location: [facility];  Service: General;  Laterality: Right;  . WISDOM TOOTH EXTRACTION      has Breast cancer of upper-outer quadrant of left female breast ([place]); Family history of breast cancer in female; Family history of  uterine cancer; Bronchitis; Diarrhea; Genetic testing; and UTI (urinary tract infection) on her problem list.    is allergic to other; oxycodone; and peanuts [peanut oil].  Allergies as of 03/25/2016      Reactions   Other Swelling   Magic Mouthwash w/ lidocaine   Oxycodone Nausea And Vomiting   Pt reports adverse reaction.    Peanuts [peanut Oil] Hives      Medication List       Accurate as of 03/25/16  3:26 PM. Always use your most recent med list.          DAYQUIL PO Take 1 capsule by mouth as needed (cold).   dexamethasone 4 MG tablet Commonly known as:  DECADRON Take 1 tablet (4 mg total) by mouth daily. Take 1 tablet daily before and 1 tablet day after chemotherapy   levofloxacin 500 MG tablet Commonly known as:  LEVAQUIN Take 1 tablet (500 mg total) by mouth daily.   lidocaine-prilocaine cream Commonly known as:  EMLA Apply to affected area once   loratadine 10 MG tablet Commonly known as:  CLARITIN Take 10 mg by mouth daily.   LORazepam 0.5 MG tablet Commonly known as:  ATIVAN Take 1 tablet (0.5 mg total) by mouth at bedtime. As needed for sleep   magic mouthwash w/lidocaine Soln Take 5 mLs by mouth 4 (four) times daily as needed for mouth pain.   NYQUIL PO Take 1 capsule by mouth at bedtime.   ondansetron  8 MG tablet Commonly known as:  ZOFRAN Take 1 tablet (8 mg total) by mouth 2 (two) times daily as needed for refractory nausea / vomiting. Start on day 3 after chemo.   prochlorperazine 10 MG tablet Commonly known as:  COMPAZINE Take 1 tablet (10 mg total) by mouth every 6 (six) hours as needed (Nausea or vomiting).        PHYSICAL EXAMINATION  Oncology Vitals 03/25/2016 03/14/2016  Height 170 cm -  Weight 102.649 kg -  Weight (lbs) 226 lbs 5 oz -  BMI (kg/m2) 35.44 kg/m2 -  Temp 98.6 98.6  Pulse 91 93  Resp 17 18  SpO2 99 98  BSA (m2) 2.2 m2 -   BP Readings from Last 2 Encounters:  03/25/16 113/67  03/14/16 111/76    Physical Exam    Constitutional: She is oriented to person, place, and time and well-developed, well-nourished, and in no distress.  HENT:  Head: Normocephalic and atraumatic.  Eyes: Conjunctivae and EOM are normal. Pupils are equal, round, and reactive to light.  Neck: Normal range of motion.  Pulmonary/Chest: Effort normal. No respiratory distress.  Musculoskeletal: Normal range of motion.  Neurological: She is alert and oriented to person, place, and time. Gait normal.  Skin: No rash noted.  Psychiatric: Affect normal.  Nursing note and vitals reviewed.   LABORATORY DATA:. Appointment on 03/25/2016  Component Date Value Ref Range Status  . WBC 03/25/2016 15.6* 3.9 - 10.3 10e3/uL Final  . NEUT# 03/25/2016 11.7* 1.5 - 6.5 10e3/uL Final  . HGB 03/25/2016 10.1* 11.6 - 15.9 g/dL Final  . HCT 03/25/2016 31.9* 34.8 - 46.6 % Final  . Platelets 03/25/2016 174  145 - 400 10e3/uL Final  . MCV 03/25/2016 76.5* 79.5 - 101.0 fL Final  . MCH 03/25/2016 24.2* 25.1 - 34.0 pg Final  . MCHC 03/25/2016 31.7  31.5 - 36.0 g/dL Final  . RBC 03/25/2016 4.17  3.70 - 5.45 10e6/uL Final  . RDW 03/25/2016 18.4* 11.2 - 14.5 % Final  . lymph# 03/25/2016 3.1  0.9 - 3.3 10e3/uL Final  . MONO# 03/25/2016 0.8  0.1 - 0.9 10e3/uL Final  . Eosinophils Absolute 03/25/2016 0.0  0.0 - 0.5 10e3/uL Final  . Basophils Absolute 03/25/2016 0.0  0.0 - 0.1 10e3/uL Final  . NEUT% 03/25/2016 75.3  38.4 - 76.8 % Final  . LYMPH% 03/25/2016 19.8  14.0 - 49.7 % Final  . MONO% 03/25/2016 4.8  0.0 - 14.0 % Final  . EOS% 03/25/2016 0.0  0.0 - 7.0 % Final  . BASO% 03/25/2016 0.1  0.0 - 2.0 % Final  . Sodium 03/25/2016 138  136 - 145 mEq/L Final  . Potassium 03/25/2016 4.1  3.5 - 5.1 mEq/L Final  . Chloride 03/25/2016 103  98 - 109 mEq/L Final  . CO2 03/25/2016 28  22 - 29 mEq/L Final  . Glucose 03/25/2016 94  70 - 140 mg/dl Final  . BUN 03/25/2016 8.9  7.0 - 26.0 mg/dL Final  . Creatinine 03/25/2016 0.9  0.6 - 1.1 mg/dL Final  . Total  Bilirubin 03/25/2016 0.22  0.20 - 1.20 mg/dL Final  . Alkaline Phosphatase 03/25/2016 131  40 - 150 U/L Final  . AST 03/25/2016 23  5 - 34 U/L Final  . ALT 03/25/2016 28  0 - 55 U/L Final  . Total Protein 03/25/2016 7.1  6.4 - 8.3 g/dL Final  . Albumin 03/25/2016 3.7  3.5 - 5.0 g/dL Final  . Calcium 03/25/2016 9.3  8.4 - 10.4 mg/dL Final  . Anion Gap 03/25/2016 7  3 - 11 mEq/L Final  . EGFR 03/25/2016 >90  >90 ml/min/1.73 m2 Final  . Glucose 03/25/2016 Negative  Negative mg/dL Final  . Bilirubin (Urine) 03/25/2016 Color Interference  Negative Final  . Ketones 03/25/2016 Negative  Negative mg/dL Final  . Specific Gravity, Urine 03/25/2016 1.010  1.003 - 1.035 Final  . Blood 03/25/2016 Trace  Negative Final  . pH 03/25/2016 7.5  4.6 - 8.0 Final  . Protein 03/25/2016 Color Interference  Negative- <30 mg/dL Final  . Urobilinogen, UR 03/25/2016 0.2  0.2 - 1 mg/dL Final  . Nitrite 03/25/2016 Positive  Negative Final  . Leukocyte Esterase 03/25/2016 Trace  Negative Final  . RBC / HPF 03/25/2016 0-2  0 - 2 Final  . WBC, UA 03/25/2016 3-6  0 - 2 Final  . Bacteria, UA 03/25/2016 Few  Negative- Trace Final  . Epithelial Cells 03/25/2016 Moderate  Negative- Few Final  . Mucus, UA 03/25/2016 Small  Negative- Small Final  . Magnesium 03/25/2016 1.8  1.5 - 2.5 mg/dl Final    RADIOGRAPHIC STUDIES: No results found.  ASSESSMENT/PLAN:    UTI (urinary tract infection) Patient presents to the [facility] today with complaint of dysuria and urinary frequency.  She reports some vague pelvic discomfort as well.  She denies any flank pain.  She denies any recent fevers or chills.  Patient states she has history of UTIs.  She was treated for UTI in mid December 2017 as well.  Urinalysis obtained today was positive for nitrates.  With Kinevac of patient's most recent UTI in December-the urine culture was sensitive to Levaquin.  At that time.  We'll prescribe Levaquin a can for treatment of UTI symptoms;  while awaiting urine culture results.  Patient was advised to call/return or go directly to the emergency department for any worsening symptoms whatsoever.  Diarrhea Patient states that she always has a few episodes of diarrhea following her chemotherapy.  She states that the diarrhea is resolved when she takes 2 of the Imodium tablets.  She does not feel dehydrated today.  She does state that the diarrhea caused her to have an irritated rash to her buttocks which has now resolved.  Brief exam of this area to the buttocks reveals that rash has resolved completely; but there is some darkened skin to the area now.  Breast cancer of upper-outer quadrant of left female breast [facility]) Patient received cycle 2 of her Taxotere/carboplatin/Herceptin/Perjeta chemotherapy on 03/14/2016.  She is scheduled to return on 04/04/2016 for labs, visit, and her next cycle of chemotherapy.   Patient stated understanding of all instructions; and was in agreement with this plan of care. The patient knows to call the clinic with any problems, questions or concerns.   Total time spent with patient was 25 minutes;  with greater than 75 percent of that time spent in face to face counseling regarding patient's symptoms,  and coordination of care and follow up.  Disclaimer:This dictation was prepared with Dragon/digital dictation along with [employer]. Any transcriptional errors that result from this process are unintentional.  Drue Second, NP 03/25/2016

## 2016-03-26 LAB — URINE CULTURE

## 2016-04-04 ENCOUNTER — Encounter: Payer: Self-pay | Admitting: Hematology and Oncology

## 2016-04-04 ENCOUNTER — Ambulatory Visit (HOSPITAL_BASED_OUTPATIENT_CLINIC_OR_DEPARTMENT_OTHER): Payer: 59 | Admitting: Hematology and Oncology

## 2016-04-04 ENCOUNTER — Other Ambulatory Visit (HOSPITAL_BASED_OUTPATIENT_CLINIC_OR_DEPARTMENT_OTHER): Payer: 59

## 2016-04-04 ENCOUNTER — Ambulatory Visit (HOSPITAL_BASED_OUTPATIENT_CLINIC_OR_DEPARTMENT_OTHER): Payer: 59

## 2016-04-04 DIAGNOSIS — R197 Diarrhea, unspecified: Secondary | ICD-10-CM | POA: Diagnosis not present

## 2016-04-04 DIAGNOSIS — C50412 Malignant neoplasm of upper-outer quadrant of left female breast: Secondary | ICD-10-CM

## 2016-04-04 DIAGNOSIS — R110 Nausea: Secondary | ICD-10-CM

## 2016-04-04 DIAGNOSIS — C773 Secondary and unspecified malignant neoplasm of axilla and upper limb lymph nodes: Secondary | ICD-10-CM

## 2016-04-04 DIAGNOSIS — Z5111 Encounter for antineoplastic chemotherapy: Secondary | ICD-10-CM | POA: Diagnosis not present

## 2016-04-04 DIAGNOSIS — Z171 Estrogen receptor negative status [ER-]: Principal | ICD-10-CM

## 2016-04-04 DIAGNOSIS — Z5112 Encounter for antineoplastic immunotherapy: Secondary | ICD-10-CM | POA: Diagnosis not present

## 2016-04-04 DIAGNOSIS — R530 Neoplastic (malignant) related fatigue: Secondary | ICD-10-CM | POA: Diagnosis not present

## 2016-04-04 DIAGNOSIS — L658 Other specified nonscarring hair loss: Secondary | ICD-10-CM

## 2016-04-04 LAB — CBC WITH DIFFERENTIAL/PLATELET
BASO%: 0.1 % (ref 0.0–2.0)
BASOS ABS: 0 10*3/uL (ref 0.0–0.1)
EOS%: 0 % (ref 0.0–7.0)
Eosinophils Absolute: 0 10*3/uL (ref 0.0–0.5)
HCT: 29.8 % — ABNORMAL LOW (ref 34.8–46.6)
HGB: 9.5 g/dL — ABNORMAL LOW (ref 11.6–15.9)
LYMPH%: 15.8 % (ref 14.0–49.7)
MCH: 24.1 pg — AB (ref 25.1–34.0)
MCHC: 31.9 g/dL (ref 31.5–36.0)
MCV: 75.6 fL — ABNORMAL LOW (ref 79.5–101.0)
MONO#: 0.5 10*3/uL (ref 0.1–0.9)
MONO%: 4 % (ref 0.0–14.0)
NEUT#: 9.3 10*3/uL — ABNORMAL HIGH (ref 1.5–6.5)
NEUT%: 80.1 % — AB (ref 38.4–76.8)
Platelets: 373 10*3/uL (ref 145–400)
RBC: 3.94 10*6/uL (ref 3.70–5.45)
RDW: 19.5 % — AB (ref 11.2–14.5)
WBC: 11.6 10*3/uL — ABNORMAL HIGH (ref 3.9–10.3)
lymph#: 1.8 10*3/uL (ref 0.9–3.3)

## 2016-04-04 LAB — COMPREHENSIVE METABOLIC PANEL
ALT: 17 U/L (ref 0–55)
ANION GAP: 11 meq/L (ref 3–11)
AST: 22 U/L (ref 5–34)
Albumin: 3.7 g/dL (ref 3.5–5.0)
Alkaline Phosphatase: 94 U/L (ref 40–150)
BILIRUBIN TOTAL: 0.26 mg/dL (ref 0.20–1.20)
BUN: 15.5 mg/dL (ref 7.0–26.0)
CO2: 21 mEq/L — ABNORMAL LOW (ref 22–29)
CREATININE: 0.9 mg/dL (ref 0.6–1.1)
Calcium: 9.7 mg/dL (ref 8.4–10.4)
Chloride: 105 mEq/L (ref 98–109)
EGFR: >90 mL/min/{1.73_m2} (ref >90)
GLUCOSE: 115 mg/dL (ref 70–140)
Potassium: 4.1 mEq/L (ref 3.5–5.1)
Sodium: 137 mEq/L (ref 136–145)
TOTAL PROTEIN: 7.3 g/dL (ref 6.4–8.3)

## 2016-04-04 MED ORDER — ACETAMINOPHEN 325 MG PO TABS
ORAL_TABLET | ORAL | Status: AC
  Filled 2016-04-04: qty 2

## 2016-04-04 MED ORDER — ACETAMINOPHEN 325 MG PO TABS
650.0000 mg | ORAL_TABLET | Freq: Once | ORAL | Status: AC
  Administered 2016-04-04: 650 mg via ORAL

## 2016-04-04 MED ORDER — SODIUM CHLORIDE 0.9% FLUSH
10.0000 mL | INTRAVENOUS | Status: DC | PRN
  Administered 2016-04-04: 10 mL
  Filled 2016-04-04: qty 10

## 2016-04-04 MED ORDER — TRASTUZUMAB CHEMO 150 MG IV SOLR
6.0000 mg/kg | Freq: Once | INTRAVENOUS | Status: AC
  Administered 2016-04-04: 588 mg via INTRAVENOUS
  Filled 2016-04-04: qty 28

## 2016-04-04 MED ORDER — DEXAMETHASONE SODIUM PHOSPHATE 10 MG/ML IJ SOLN
10.0000 mg | Freq: Once | INTRAMUSCULAR | Status: AC
  Administered 2016-04-04: 10 mg via INTRAVENOUS

## 2016-04-04 MED ORDER — DIPHENHYDRAMINE HCL 25 MG PO CAPS
50.0000 mg | ORAL_CAPSULE | Freq: Once | ORAL | Status: AC
  Administered 2016-04-04: 50 mg via ORAL

## 2016-04-04 MED ORDER — PEGFILGRASTIM 6 MG/0.6ML ~~LOC~~ PSKT
6.0000 mg | PREFILLED_SYRINGE | Freq: Once | SUBCUTANEOUS | Status: AC
  Administered 2016-04-04: 6 mg via SUBCUTANEOUS
  Filled 2016-04-04: qty 0.6

## 2016-04-04 MED ORDER — SODIUM CHLORIDE 0.9 % IV SOLN
Freq: Once | INTRAVENOUS | Status: AC
  Administered 2016-04-04: 12:00:00 via INTRAVENOUS

## 2016-04-04 MED ORDER — PALONOSETRON HCL INJECTION 0.25 MG/5ML
0.2500 mg | Freq: Once | INTRAVENOUS | Status: AC
  Administered 2016-04-04: 0.25 mg via INTRAVENOUS

## 2016-04-04 MED ORDER — CARBOPLATIN CHEMO INJECTION 600 MG/60ML
750.0000 mg | Freq: Once | INTRAVENOUS | Status: AC
  Administered 2016-04-04: 750 mg via INTRAVENOUS
  Filled 2016-04-04: qty 75

## 2016-04-04 MED ORDER — SODIUM CHLORIDE 0.9 % IV SOLN: Freq: Once | INTRAVENOUS | Status: AC

## 2016-04-04 MED ORDER — DIPHENHYDRAMINE HCL 25 MG PO CAPS
ORAL_CAPSULE | ORAL | Status: AC
  Filled 2016-04-04: qty 2

## 2016-04-04 MED ORDER — HEPARIN SOD (PORK) LOCK FLUSH 100 UNIT/ML IV SOLN
500.0000 [IU] | Freq: Once | INTRAVENOUS | Status: AC | PRN
  Administered 2016-04-04: 500 [IU]
  Filled 2016-04-04: qty 5

## 2016-04-04 MED ORDER — PERTUZUMAB CHEMO INJECTION 420 MG/14ML
420.0000 mg | Freq: Once | INTRAVENOUS | Status: AC
  Administered 2016-04-04: 420 mg via INTRAVENOUS
  Filled 2016-04-04: qty 14

## 2016-04-04 MED ORDER — DOCETAXEL CHEMO INJECTION 160 MG/16ML
75.0000 mg/m2 | Freq: Once | INTRAVENOUS | Status: AC
  Administered 2016-04-04: 160 mg via INTRAVENOUS
  Filled 2016-04-04: qty 16

## 2016-04-04 MED ORDER — DEXAMETHASONE SODIUM PHOSPHATE 10 MG/ML IJ SOLN
INTRAMUSCULAR | Status: AC
  Filled 2016-04-04: qty 1

## 2016-04-04 MED ORDER — PALONOSETRON HCL INJECTION 0.25 MG/5ML
INTRAVENOUS | Status: AC
  Filled 2016-04-04: qty 5

## 2016-04-04 NOTE — Progress Notes (Signed)
Patient Care Team: Jearld Fenton, NP as PCP - General (Internal Medicine) Servando Salina, MD as Consulting Physician (Obstetrics and Gynecology)  DIAGNOSIS:  Encounter Diagnosis  Name Primary?  . Malignant neoplasm of upper-outer quadrant of left breast in female, estrogen receptor negative ([place])     SUMMARY OF ONCOLOGIC HISTORY:   Breast cancer of upper-outer quadrant of left female breast ([place])   12/19/2015 Initial Diagnosis    Left breast biopsy: IDC with DCI S, grade 3, ER 0%, PR 0%, KIC system 40%, her 2 positive ratio 6.15, copy number 16.5; palpable lump left breast 1.6 cm irregular oval mass, T1cN0 stage I a clinical stage      01/17/2016 Surgery    Left Lumpectomy: IDC 4.1 cm with DCIS, Margin 0.1 cm, 1/3 LN Positive, ER/PR Neg, Her 2 Pos with Ki 67 of 40%, T2N1 (stage 2B)      02/22/2016 -  Chemotherapy    Adjuvant chemotherapy with TCH Perjeta 6 cycles followed by Herceptin Perjeta maintenance        CHIEF COMPLIANT: cycle 3 TCH Perjeta  INTERVAL HISTORY: Brianna James is a 36 year old with above-mentioned history of left breast cancer treated with lumpectomy and is currently on adjuvant chemotherapy with TCH Perjeta. She had 2 cycles of chemotherapy and had profound diarrhea along with fatigue and nausea and hair loss.  Patient had 2 episodes of UTI which were treated with antibiotics. She also had thrush which got better with the baking soda and salt water. She could not tolerate Magic mouthwash. She gets diarrhea once every 7-10 days which gets better with Imodium.   REVIEW OF SYSTEMS:   Constitutional: Denies fevers, chills or abnormal weight loss Eyes: Denies blurriness of vision Ears, nose, mouth, throat, and face: Denies mucositis or sore throat Respiratory: Denies cough, dyspnea or wheezes Cardiovascular: Denies palpitation, chest discomfort Gastrointestinal:  Denies nausea, heartburn or change in bowel habits Skin: Denies abnormal skin  rashes Lymphatics: Denies new lymphadenopathy or easy bruising Neurological:Denies numbness, tingling or new weaknesses Behavioral/Psych: Mood is stable, no new changes  Extremities: No lower extremity edema Breast:  denies any pain or lumps or nodules in either breasts All other systems were reviewed with the patient and are negative.  I have reviewed the past medical history, past surgical history, social history and family history with the patient and they are unchanged from previous note.  ALLERGIES:  is allergic to other; oxycodone; and peanuts [peanut oil].  MEDICATIONS:  Current Outpatient Prescriptions  Medication Sig Dispense Refill  . dexamethasone (DECADRON) 4 MG tablet Take 1 tablet (4 mg total) by mouth daily. Take 1 tablet daily before and 1 tablet day after chemotherapy 30 tablet 1  . lidocaine-prilocaine (EMLA) cream Apply to affected area once 30 g 3  . loratadine (CLARITIN) 10 MG tablet Take 10 mg by mouth daily.    Marland Kitchen LORazepam (ATIVAN) 0.5 MG tablet Take 1 tablet (0.5 mg total) by mouth at bedtime. As needed for sleep 30 tablet 0  . ondansetron (ZOFRAN) 8 MG tablet Take 1 tablet (8 mg total) by mouth 2 (two) times daily as needed for refractory nausea / vomiting. Start on day 3 after chemo. 30 tablet 1  . prochlorperazine (COMPAZINE) 10 MG tablet Take 1 tablet (10 mg total) by mouth every 6 (six) hours as needed (Nausea or vomiting). 30 tablet 1  . Pseudoeph-Doxylamine-DM-APAP (NYQUIL PO) Take 1 capsule by mouth at bedtime.    . Pseudoephedrine-APAP-DM (DAYQUIL PO) Take 1 capsule by  mouth as needed (cold).     No current facility-administered medications for this visit.    Facility-Administered Medications Ordered in Other Visits  Medication Dose Route Frequency Provider Last Rate Last Dose  . 0.9 %  sodium chloride infusion   Intravenous Once Nicholas Lose, MD      . CARBOplatin (PARAPLATIN) 750 mg in sodium chloride 0.9 % 250 mL chemo infusion  750 mg Intravenous Once  Nicholas Lose, MD      . dexamethasone (DECADRON) injection 10 mg  10 mg Intravenous Once Nicholas Lose, MD      . DOCEtaxel (TAXOTERE) 160 mg in dextrose 5 % 250 mL chemo infusion  75 mg/m2 (Treatment Plan Recorded) Intravenous Once Nicholas Lose, MD      . heparin lock flush 100 unit/mL  500 Units Intracatheter Once PRN Nicholas Lose, MD      . palonosetron (ALOXI) injection 0.25 mg  0.25 mg Intravenous Once Nicholas Lose, MD      . pegfilgrastim (NEULASTA ONPRO KIT) injection 6 mg  6 mg Subcutaneous Once Nicholas Lose, MD      . pertuzumab (PERJETA) 420 mg in sodium chloride 0.9 % 250 mL chemo infusion  420 mg Intravenous Once Nicholas Lose, MD      . sodium chloride flush (NS) 0.9 % injection 10 mL  10 mL Intracatheter PRN Nicholas Lose, MD      . trastuzumab (HERCEPTIN) 588 mg in sodium chloride 0.9 % 250 mL chemo infusion  6 mg/kg (Treatment Plan Recorded) Intravenous Once Nicholas Lose, MD        PHYSICAL EXAMINATION: ECOG PERFORMANCE STATUS: 2 - Symptomatic, <50% confined to bed  Vitals:   04/04/16 1111  BP: 119/64  Pulse: 82  Resp: 18  Temp: 97.6 F (36.4 C)   Filed Weights   04/04/16 1111  Weight: 226 lb 11.2 oz (102.8 kg)    GENERAL:alert, no distress and comfortable SKIN: skin color, texture, turgor are normal, no rashes or significant lesions EYES: normal, Conjunctiva are pink and non-injected, sclera clear OROPHARYNX:no exudate, no erythema and lips, buccal mucosa, and tongue normal  NECK: supple, thyroid normal size, non-tender, without nodularity LYMPH:  no palpable lymphadenopathy in the cervical, axillary or inguinal LUNGS: clear to auscultation and percussion with normal breathing effort HEART: regular rate & rhythm and no murmurs and no lower extremity edema ABDOMEN:abdomen soft, non-tender and normal bowel sounds MUSCULOSKELETAL:no cyanosis of digits and no clubbing  NEURO: alert & oriented x 3 with fluent speech, no focal motor/sensory deficits EXTREMITIES: No lower  extremity edema  LABORATORY DATA:  I have reviewed the data as listed   Chemistry      Component Value Date/Time   NA 137 04/04/2016 1033   K 4.1 04/04/2016 1033   CL 98 (L) 02/29/2016 0148   CO2 21 (L) 04/04/2016 1033   BUN 15.5 04/04/2016 1033   CREATININE 0.9 04/04/2016 1033      Component Value Date/Time   CALCIUM 9.7 04/04/2016 1033   ALKPHOS 94 04/04/2016 1033   AST 22 04/04/2016 1033   ALT 17 04/04/2016 1033   BILITOT 0.26 04/04/2016 1033       Lab Results  Component Value Date   WBC 11.6 (H) 04/04/2016   HGB 9.5 (L) 04/04/2016   HCT 29.8 (L) 04/04/2016   MCV 75.6 (L) 04/04/2016   PLT 373 04/04/2016   NEUTROABS 9.3 (H) 04/04/2016    ASSESSMENT & PLAN:  Breast cancer of upper-outer quadrant of left female breast ([place])  01/17/16: Left Lumpectomy: 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 Adjuvant chemotherapy with TCHP X 6 cycles followed by Herceptin-Perjeta maintenance for one year 2. Followed by adjuvant radiation --------------------------------------------------------------------------- Current treatment: Cycle 3 TCHP Chemotherapy toxicities: 1. Diarrhea: Improved with Imodium (rash in the buttock related to diarrhea) 2. UTI treated by symptom management with antibiotic 3. Alopecia 4. Nausea grade 1: Improved with Zofran and Compazine 5. Fatigue due to chemotherapy last from day 3 today 8  Monitoring closely for chemotherapy toxicities Return to clinic in 3 weeks for cycle 4  I spent 25 minutes talking to the patient of which more than half was spent in counseling and coordination of care.  No orders of the defined types were placed in this encounter.  The patient has a good understanding of the overall plan. she agrees with it. she will call with any problems that may develop before the next visit here.   Rulon Eisenmenger, MD 04/04/16

## 2016-04-04 NOTE — Patient Instructions (Signed)
[facility] Discharge Instructions for Patients Receiving Chemotherapy  Today you received the following chemotherapy agents Herceptin, Perjeta, Taxotere and Carboplatin  To help prevent nausea and vomiting after your treatment, we encourage you to take your nausea medication as directed. No Zofran for 3 days. Take Compazine instead.    If you develop nausea and vomiting that is not controlled by your nausea medication, call the clinic.   BELOW ARE SYMPTOMS THAT SHOULD BE REPORTED IMMEDIATELY:  *FEVER GREATER THAN 100.5 F  *CHILLS WITH OR WITHOUT FEVER  NAUSEA AND VOMITING THAT IS NOT CONTROLLED WITH YOUR NAUSEA MEDICATION  *UNUSUAL SHORTNESS OF BREATH  *UNUSUAL BRUISING OR BLEEDING  TENDERNESS IN MOUTH AND THROAT WITH OR WITHOUT PRESENCE OF ULCERS  *URINARY PROBLEMS  *BOWEL PROBLEMS  UNUSUAL RASH Items with * indicate a potential emergency and should be followed up as soon as possible.  Feel free to call the clinic you have any questions or concerns. The clinic phone number is [phone].  Please show the [facility] at check-in to the Emergency Department and triage nurse.  Trastuzumab injection for infusion What is this medicine? TRASTUZUMAB (tras TOO zoo mab) is a monoclonal antibody. It is used to treat breast cancer and stomach cancer. This medicine may be used for other purposes; ask your health care provider or pharmacist if you have questions. COMMON BRAND NAME(S): Herceptin What should I tell my health care provider before I take this medicine? They need to know if you have any of these conditions: -heart disease -heart failure -infection (especially a virus infection such as chickenpox, cold sores, or herpes) -lung or breathing disease, like asthma -recent or ongoing radiation therapy -an unusual or allergic reaction to trastuzumab, benzyl alcohol, or other medications, foods, dyes, or preservatives -pregnant or trying to get  pregnant -breast-feeding How should I use this medicine? This drug is given as an infusion into a vein. It is administered in a hospital or clinic by a specially trained health care professional. Talk to your pediatrician regarding the use of this medicine in children. This medicine is not approved for use in children. Overdosage: If you think you have taken too much of this medicine contact a poison control center or emergency room at once. NOTE: This medicine is only for you. Do not share this medicine with others. What if I miss a dose? It is important not to miss a dose. Call your doctor or health care professional if you are unable to keep an appointment. What may interact with this medicine? -doxorubicin -warfarin This list may not describe all possible interactions. Give your health care provider a list of all the medicines, herbs, non-prescription drugs, or dietary supplements you use. Also tell them if you smoke, drink alcohol, or use illegal drugs. Some items may interact with your medicine. What should I watch for while using this medicine? Visit your doctor for checks on your progress. Report any side effects. Continue your course of treatment even though you feel ill unless your doctor tells you to stop. Call your doctor or health care professional for advice if you get a fever, chills or sore throat, or other symptoms of a cold or flu. Do not treat yourself. Try to avoid being around people who are sick. You may experience fever, chills and shaking during your first infusion. These effects are usually mild and can be treated with other medicines. Report any side effects during the infusion to your health care professional. Fever and chills  usually do not happen with later infusions. Do not become pregnant while taking this medicine or for 7 months after stopping it. Women should inform their doctor if they wish to become pregnant or think they might be pregnant. Women of child-bearing  potential will need to have a negative pregnancy test before starting this medicine. There is a potential for serious side effects to an unborn child. Talk to your health care professional or pharmacist for more information. Do not breast-feed an infant while taking this medicine or for 7 months after stopping it. Women must use effective birth control with this medicine. What side effects may I notice from receiving this medicine? Side effects that you should report to your doctor or health care professional as soon as possible: -breathing difficulties -chest pain or palpitations -cough -dizziness or fainting -fever or chills, sore throat -skin rash, itching or hives -swelling of the legs or ankles -unusually weak or tired Side effects that usually do not require medical attention (report to your doctor or health care professional if they continue or are bothersome): -loss of appetite -headache -muscle aches -nausea This list may not describe all possible side effects. Call your doctor for medical advice about side effects. You may report side effects to FDA at 1-800-FDA-1088. Where should I keep my medicine? This drug is given in a hospital or clinic and will not be stored at home. NOTE: This sheet is a summary. It may not cover all possible information. If you have questions about this medicine, talk to your doctor, pharmacist, or health care provider.  2017 Elsevier/Gold Standard (2015-04-05 17:16:44)  Pertuzumab injection What is this medicine? PERTUZUMAB (per TOOZ ue mab) is a monoclonal antibody. It is used to treat breast cancer. COMMON BRAND NAME(S): PERJETA What should I tell my health care provider before I take this medicine? They need to know if you have any of these conditions: -heart disease -heart failure -high blood pressure -history of irregular heart beat -recent or ongoing radiation therapy -an unusual or allergic reaction to pertuzumab, other medicines, foods,  dyes, or preservatives -pregnant or trying to get pregnant -breast-feeding How should I use this medicine? This medicine is for infusion into a vein. It is given by a health care professional in a hospital or clinic setting. Talk to your pediatrician regarding the use of this medicine in children. Special care may be needed. What if I miss a dose? It is important not to miss your dose. Call your doctor or health care professional if you are unable to keep an appointment. What may interact with this medicine? Interactions are not expected. Give your health care provider a list of all the medicines, herbs, non-prescription drugs, or dietary supplements you use. Also tell them if you smoke, drink alcohol, or use illegal drugs. Some items may interact with your medicine. What should I watch for while using this medicine? Your condition will be monitored carefully while you are receiving this medicine. Report any side effects. Continue your course of treatment even though you feel ill unless your doctor tells you to stop. Do not become pregnant while taking this medicine or for 7 months after stopping it. Women should inform their doctor if they wish to become pregnant or think they might be pregnant. Women of child-bearing potential will need to have a negative pregnancy test before starting this medicine. There is a potential for serious side effects to an unborn child. Talk to your health care professional or pharmacist for more information. Do  not breast-feed an infant while taking this medicine or for 7 months after stopping it. Women must use effective birth control with this medicine. Call your doctor or health care professional for advice if you get a fever, chills or sore throat, or other symptoms of a cold or flu. Do not treat yourself. Try to avoid being around people who are sick. You may experience fever, chills, and headache during the infusion. Report any side effects during the infusion to  your health care professional. What side effects may I notice from receiving this medicine? Side effects that you should report to your doctor or health care professional as soon as possible: -breathing problems -chest pain or palpitations -dizziness -feeling faint or lightheaded -fever or chills -skin rash, itching or hives -sore throat -swelling of the face, lips, or tongue -swelling of the legs or ankles -unusually weak or tired Side effects that usually do not require medical attention (report to your doctor or health care professional if they continue or are bothersome): -diarrhea -hair loss -nausea, vomiting -tiredness Where should I keep my medicine? This drug is given in a hospital or clinic and will not be stored at home.  2017 Elsevier/Gold Standard (2015-04-06 12:08:50)  Docetaxel injection What is this medicine? DOCETAXEL (doe se TAX el) is a chemotherapy drug. It targets fast dividing cells, like cancer cells, and causes these cells to die. This medicine is used to treat many types of cancers like breast cancer, certain stomach cancers, head and neck cancer, lung cancer, and prostate cancer. This medicine may be used for other purposes; ask your health care provider or pharmacist if you have questions. COMMON BRAND NAME(S): Docefrez, Taxotere What should I tell my health care provider before I take this medicine? They need to know if you have any of these conditions: -infection (especially a virus infection such as chickenpox, cold sores, or herpes) -liver disease -low blood counts, like low white cell, platelet, or red cell counts -an unusual or allergic reaction to docetaxel, polysorbate 80, other chemotherapy agents, other medicines, foods, dyes, or preservatives -pregnant or trying to get pregnant -breast-feeding How should I use this medicine? This drug is given as an infusion into a vein. It is administered in a hospital or clinic by a specially trained health  care professional. Talk to your pediatrician regarding the use of this medicine in children. Special care may be needed. Overdosage: If you think you have taken too much of this medicine contact a poison control center or emergency room at once. NOTE: This medicine is only for you. Do not share this medicine with others. What if I miss a dose? It is important not to miss your dose. Call your doctor or health care professional if you are unable to keep an appointment. What may interact with this medicine? -cyclosporine -erythromycin -ketoconazole -medicines to increase blood counts like filgrastim, pegfilgrastim, sargramostim -vaccines Talk to your doctor or health care professional before taking any of these medicines: -acetaminophen -aspirin -ibuprofen -ketoprofen -naproxen This list may not describe all possible interactions. Give your health care provider a list of all the medicines, herbs, non-prescription drugs, or dietary supplements you use. Also tell them if you smoke, drink alcohol, or use illegal drugs. Some items may interact with your medicine. What should I watch for while using this medicine? Your condition will be monitored carefully while you are receiving this medicine. You will need important blood work done while you are taking this medicine. This drug may make you feel  generally unwell. This is not uncommon, as chemotherapy can affect healthy cells as well as cancer cells. Report any side effects. Continue your course of treatment even though you feel ill unless your doctor tells you to stop. In some cases, you may be given additional medicines to help with side effects. Follow all directions for their use. Call your doctor or health care professional for advice if you get a fever, chills or sore throat, or other symptoms of a cold or flu. Do not treat yourself. This drug decreases your body's ability to fight infections. Try to avoid being around people who are sick. This  medicine may increase your risk to bruise or bleed. Call your doctor or health care professional if you notice any unusual bleeding. This medicine may contain alcohol in the product. You may get drowsy or dizzy. Do not drive, use machinery, or do anything that needs mental alertness until you know how this medicine affects you. Do not stand or sit up quickly, especially if you are an older patient. This reduces the risk of dizzy or fainting spells. Avoid alcoholic drinks. Do not become pregnant while taking this medicine. Women should inform their doctor if they wish to become pregnant or think they might be pregnant. There is a potential for serious side effects to an unborn child. Talk to your health care professional or pharmacist for more information. Do not breast-feed an infant while taking this medicine. What side effects may I notice from receiving this medicine? Side effects that you should report to your doctor or health care professional as soon as possible: -allergic reactions like skin rash, itching or hives, swelling of the face, lips, or tongue -low blood counts - This drug may decrease the number of white blood cells, red blood cells and platelets. You may be at increased risk for infections and bleeding. -signs of infection - fever or chills, cough, sore throat, pain or difficulty passing urine -signs of decreased platelets or bleeding - bruising, pinpoint red spots on the skin, black, tarry stools, nosebleeds -signs of decreased red blood cells - unusually weak or tired, fainting spells, lightheadedness -breathing problems -fast or irregular heartbeat -low blood pressure -mouth sores -nausea and vomiting -pain, swelling, redness or irritation at the injection site -pain, tingling, numbness in the hands or feet -swelling of the ankle, feet, hands -weight gain Side effects that usually do not require medical attention (report to your doctor or health care professional if they  continue or are bothersome): -bone pain -complete hair loss including hair on your head, underarms, pubic hair, eyebrows, and eyelashes -diarrhea -excessive tearing -changes in the color of fingernails -loosening of the fingernails -nausea -muscle pain -red flush to skin -sweating -weak or tired This list may not describe all possible side effects. Call your doctor for medical advice about side effects. You may report side effects to FDA at 1-800-FDA-1088. Where should I keep my medicine? This drug is given in a hospital or clinic and will not be stored at home. NOTE: This sheet is a summary. It may not cover all possible information. If you have questions about this medicine, talk to your doctor, pharmacist, or health care provider.  2017 Elsevier/Gold Standard (2015-04-06 12:32:56)  Carboplatin injection What is this medicine? CARBOPLATIN (KAR boe pla tin) is a chemotherapy drug. It targets fast dividing cells, like cancer cells, and causes these cells to die. This medicine is used to treat ovarian cancer and many other cancers. This medicine may be used for  other purposes; ask your health care provider or pharmacist if you have questions. COMMON BRAND NAME(S): Paraplatin What should I tell my health care provider before I take this medicine? They need to know if you have any of these conditions: -blood disorders -hearing problems -kidney disease -recent or ongoing radiation therapy -an unusual or allergic reaction to carboplatin, cisplatin, other chemotherapy, other medicines, foods, dyes, or preservatives -pregnant or trying to get pregnant -breast-feeding How should I use this medicine? This drug is usually given as an infusion into a vein. It is administered in a hospital or clinic by a specially trained health care professional. Talk to your pediatrician regarding the use of this medicine in children. Special care may be needed. Overdosage: If you think you have taken too  much of this medicine contact a poison control center or emergency room at once. NOTE: This medicine is only for you. Do not share this medicine with others. What if I miss a dose? It is important not to miss a dose. Call your doctor or health care professional if you are unable to keep an appointment. What may interact with this medicine? -medicines for seizures -medicines to increase blood counts like filgrastim, pegfilgrastim, sargramostim -some antibiotics like amikacin, gentamicin, neomycin, streptomycin, tobramycin -vaccines Talk to your doctor or health care professional before taking any of these medicines: -acetaminophen -aspirin -ibuprofen -ketoprofen -naproxen This list may not describe all possible interactions. Give your health care provider a list of all the medicines, herbs, non-prescription drugs, or dietary supplements you use. Also tell them if you smoke, drink alcohol, or use illegal drugs. Some items may interact with your medicine. What should I watch for while using this medicine? Your condition will be monitored carefully while you are receiving this medicine. You will need important blood work done while you are taking this medicine. This drug may make you feel generally unwell. This is not uncommon, as chemotherapy can affect healthy cells as well as cancer cells. Report any side effects. Continue your course of treatment even though you feel ill unless your doctor tells you to stop. In some cases, you may be given additional medicines to help with side effects. Follow all directions for their use. Call your doctor or health care professional for advice if you get a fever, chills or sore throat, or other symptoms of a cold or flu. Do not treat yourself. This drug decreases your body's ability to fight infections. Try to avoid being around people who are sick. This medicine may increase your risk to bruise or bleed. Call your doctor or health care professional if you  notice any unusual bleeding. Be careful brushing and flossing your teeth or using a toothpick because you may get an infection or bleed more easily. If you have any dental work done, tell your dentist you are receiving this medicine. Avoid taking products that contain aspirin, acetaminophen, ibuprofen, naproxen, or ketoprofen unless instructed by your doctor. These medicines may hide a fever. Do not become pregnant while taking this medicine. Women should inform their doctor if they wish to become pregnant or think they might be pregnant. There is a potential for serious side effects to an unborn child. Talk to your health care professional or pharmacist for more information. Do not breast-feed an infant while taking this medicine. What side effects may I notice from receiving this medicine? Side effects that you should report to your doctor or health care professional as soon as possible: -allergic reactions like skin rash, itching  or hives, swelling of the face, lips, or tongue -signs of infection - fever or chills, cough, sore throat, pain or difficulty passing urine -signs of decreased platelets or bleeding - bruising, pinpoint red spots on the skin, black, tarry stools, nosebleeds -signs of decreased red blood cells - unusually weak or tired, fainting spells, lightheadedness -breathing problems -changes in hearing -changes in vision -chest pain -high blood pressure -low blood counts - This drug may decrease the number of white blood cells, red blood cells and platelets. You may be at increased risk for infections and bleeding. -nausea and vomiting -pain, swelling, redness or irritation at the injection site -pain, tingling, numbness in the hands or feet -problems with balance, talking, walking -trouble passing urine or change in the amount of urine Side effects that usually do not require medical attention (report to your doctor or health care professional if they continue or are  bothersome): -hair loss -loss of appetite -metallic taste in the mouth or changes in taste This list may not describe all possible side effects. Call your doctor for medical advice about side effects. You may report side effects to FDA at 1-800-FDA-1088. Where should I keep my medicine? This drug is given in a hospital or clinic and will not be stored at home. NOTE: This sheet is a summary. It may not cover all possible information. If you have questions about this medicine, talk to your doctor, pharmacist, or health care provider.  2017 Elsevier/Gold Standard (2007-06-09 14:38:05)

## 2016-04-04 NOTE — Assessment & Plan Note (Signed)
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 Adjuvant chemotherapy with TCHP X 6 cycles followed by Herceptin-Perjeta maintenance for one year 2. Followed by adjuvant radiation --------------------------------------------------------------------------- Current treatment: Cycle 3 TCHP Chemotherapy toxicities: 1. Diarrhea: Improved with Imodium (rash in the buttock related to diarrhea) 2. UTI treated by symptom management with antibiotic 3. Alopecia  Return to clinic in 3 weeks for cycle 4

## 2016-04-09 ENCOUNTER — Telehealth: Payer: Self-pay

## 2016-04-09 NOTE — Telephone Encounter (Signed)
Pt is getting a rash at the top of her buttock cleft. She had TC, herc/perj 04/04/16. She had this same rash/discoloration after last chemo. It is red and itchy, not really bumpy,  She is requesting someone look at it.  She also mentioned her menstrual period was 2 weeks late. It is now 11 days of bleeding where she usually goes 4 days.   S/w May who will let Dr Lindi Adie know. Will set up for 1/24 0900 [facility], no labs.

## 2016-04-10 ENCOUNTER — Encounter: Payer: Self-pay | Admitting: Nurse Practitioner

## 2016-04-10 ENCOUNTER — Other Ambulatory Visit: Payer: Self-pay | Admitting: *Deleted

## 2016-04-10 ENCOUNTER — Ambulatory Visit (HOSPITAL_BASED_OUTPATIENT_CLINIC_OR_DEPARTMENT_OTHER): Payer: 59

## 2016-04-10 ENCOUNTER — Telehealth: Payer: Self-pay | Admitting: *Deleted

## 2016-04-10 ENCOUNTER — Ambulatory Visit (HOSPITAL_BASED_OUTPATIENT_CLINIC_OR_DEPARTMENT_OTHER): Payer: 59 | Admitting: Nurse Practitioner

## 2016-04-10 VITALS — BP 112/69 | HR 69 | Temp 98.6°F | Resp 18 | Ht 67.0 in | Wt 224.8 lb

## 2016-04-10 DIAGNOSIS — R300 Dysuria: Secondary | ICD-10-CM | POA: Diagnosis not present

## 2016-04-10 DIAGNOSIS — R21 Rash and other nonspecific skin eruption: Secondary | ICD-10-CM

## 2016-04-10 DIAGNOSIS — C50412 Malignant neoplasm of upper-outer quadrant of left female breast: Secondary | ICD-10-CM | POA: Diagnosis not present

## 2016-04-10 DIAGNOSIS — N390 Urinary tract infection, site not specified: Secondary | ICD-10-CM

## 2016-04-10 LAB — CBC WITH DIFFERENTIAL/PLATELET
BASO%: 0.4 % (ref 0.0–2.0)
Basophils Absolute: 0 10*3/uL (ref 0.0–0.1)
EOS%: 0.8 % (ref 0.0–7.0)
Eosinophils Absolute: 0.1 10*3/uL (ref 0.0–0.5)
HCT: 30.8 % — ABNORMAL LOW (ref 34.8–46.6)
HGB: 10 g/dL — ABNORMAL LOW (ref 11.6–15.9)
LYMPH%: 28.4 % (ref 14.0–49.7)
MCH: 24.2 pg — ABNORMAL LOW (ref 25.1–34.0)
MCHC: 32.3 g/dL (ref 31.5–36.0)
MCV: 74.7 fL — AB (ref 79.5–101.0)
MONO#: 0.5 10*3/uL (ref 0.1–0.9)
MONO%: 6.7 % (ref 0.0–14.0)
NEUT#: 4.8 10*3/uL (ref 1.5–6.5)
NEUT%: 63.7 % (ref 38.4–76.8)
PLATELETS: 296 10*3/uL (ref 145–400)
RBC: 4.13 10*6/uL (ref 3.70–5.45)
RDW: 20.8 % — ABNORMAL HIGH (ref 11.2–14.5)
WBC: 7.5 10*3/uL (ref 3.9–10.3)
lymph#: 2.1 10*3/uL (ref 0.9–3.3)

## 2016-04-10 LAB — URINALYSIS, MICROSCOPIC - CHCC
Bilirubin (Urine): NEGATIVE
GLUCOSE UR CHCC: NEGATIVE mg/dL
Ketones: NEGATIVE mg/dL
LEUKOCYTE ESTERASE: NEGATIVE
NITRITE: NEGATIVE
PROTEIN: NEGATIVE mg/dL
Specific Gravity, Urine: 1.005 (ref 1.003–1.035)
UROBILINOGEN UR: 0.2 mg/dL (ref 0.2–1)
pH: 6 (ref 4.6–8.0)

## 2016-04-10 MED ORDER — CIPROFLOXACIN HCL 500 MG PO TABS: 500.0000 mg | ORAL_TABLET | Freq: Two times a day (BID) | ORAL | 0 refills | Status: DC

## 2016-04-10 NOTE — Progress Notes (Signed)
SYMPTOM MANAGEMENT CLINIC    Chief Complaint: UTI, rash  HPI:  Brianna James 36 y.o. female diagnosed with breast cancer.  Currently undergoing Taxotere/carboplatin/Herceptin/Perjeta chemotherapy regimen.     Breast cancer of upper-outer quadrant of left female breast ([name])   12/19/2015 Initial Diagnosis    Left breast biopsy: IDC with DCI S, grade 3, ER 0%, PR 0%, KIC system 40%, her 2 positive ratio 6.15, copy number 16.5; palpable lump left breast 1.6 cm irregular oval mass, T1cN0 stage I a clinical stage      01/17/2016 Surgery    Left Lumpectomy: IDC 4.1 cm with DCIS, Margin 0.1 cm, 1/3 LN Positive, ER/PR Neg, Her 2 Pos with Ki 67 of 40%, T2N1 (stage 2B)      02/22/2016 -  Chemotherapy    Adjuvant chemotherapy with TCH Perjeta 6 cycles followed by Herceptin Perjeta maintenance        Review of Systems  Genitourinary: Positive for dysuria.  Skin: Positive for itching and rash.  All other systems reviewed and are negative.   Past Medical History:  Diagnosis Date  . Cancer ([name]) 01/2016   breast  . Chicken pox   . Hematoma    left breast  . HSV infection   . PONV (postoperative nausea and vomiting)     Past Surgical History:  Procedure Laterality Date  . BREAST LUMPECTOMY WITH RADIOACTIVE SEED AND SENTINEL LYMPH NODE BIOPSY Left 01/17/2016   Procedure: LEFT BREAST LUMPECTOMY WITH RADIOACTIVE SEED AND SENTINEL LYMPH NODE BIOPSY;  Surgeon: [name], MD;  Location: [facility];  Service: General;  Laterality: Left;  . EVACUATION BREAST HEMATOMA Left 01/25/2016   Procedure: WASHOUT LEFT BREAST HEMATOMA;  Surgeon: [name], MD;  Location: [facility];  Service: General;  Laterality: Left;  . PORTACATH PLACEMENT Right 01/17/2016   Procedure: INSERTION PORT-A-CATH WITH [place];  Surgeon: [name], MD;  Location: [facility];  Service: General;  Laterality: Right;  . WISDOM TOOTH EXTRACTION      has Breast cancer of  upper-outer quadrant of left female breast ([name]); Family history of breast cancer in female; Family history of uterine cancer; Bronchitis; Diarrhea; Genetic testing; UTI (urinary tract infection); and Rash on her problem list.    is allergic to other; oxycodone; and peanuts [peanut oil].  Allergies as of 04/10/2016      Reactions   Other Swelling   Magic Mouthwash w/ lidocaine   Oxycodone Nausea And Vomiting   Pt reports adverse reaction.    Peanuts [peanut Oil] Hives      Medication List       Accurate as of 04/10/16 11:48 AM. Always use your most recent med list.          ciprofloxacin 500 MG tablet Commonly known as:  CIPRO Take 1 tablet (500 mg total) by mouth 2 (two) times daily.   dexamethasone 4 MG tablet Commonly known as:  DECADRON Take 1 tablet (4 mg total) by mouth daily. Take 1 tablet daily before and 1 tablet day after chemotherapy   lidocaine-prilocaine cream Commonly known as:  EMLA Apply to affected area once   loratadine 10 MG tablet Commonly known as:  CLARITIN Take 10 mg by mouth daily.   LORazepam 0.5 MG tablet Commonly known as:  ATIVAN Take 1 tablet (0.5 mg total) by mouth at bedtime. As needed for sleep   ondansetron 8 MG tablet Commonly known as:  ZOFRAN Take 1 tablet (8 mg total) by  mouth 2 (two) times daily as needed for refractory nausea / vomiting. Start on day 3 after chemo.   prochlorperazine 10 MG tablet Commonly known as:  COMPAZINE Take 1 tablet (10 mg total) by mouth every 6 (six) hours as needed (Nausea or vomiting).        PHYSICAL EXAMINATION  Oncology Vitals 04/10/2016 04/04/2016  Height 170 cm 170 cm  Weight 101.969 kg 102.83 kg  Weight (lbs) 224 lbs 13 oz 226 lbs 11 oz  BMI (kg/m2) 35.21 kg/m2 35.51 kg/m2  Temp 98.6 97.6  Pulse 69 82  Resp 18 18  SpO2 100 100  BSA (m2) 2.2 m2 2.2 m2   BP Readings from Last 2 Encounters:  04/10/16 112/69  04/04/16 119/64    Physical Exam  Constitutional: She is oriented to  person, place, and time and well-developed, well-nourished, and in no distress.  HENT:  Head: Normocephalic and atraumatic.  Eyes: Conjunctivae and EOM are normal. Pupils are equal, round, and reactive to light.  Neck: Normal range of motion.  Pulmonary/Chest: Effort normal. No respiratory distress.  Musculoskeletal: Normal range of motion. She exhibits no edema or tenderness.  Neurological: She is alert and oriented to person, place, and time. Gait normal.  Skin: Skin is warm and dry. Rash noted.  Psychiatric: Affect normal.  Nursing note and vitals reviewed.   LABORATORY DATA:. Appointment on 04/10/2016  Component Date Value Ref Range Status  . WBC 04/10/2016 7.5  3.9 - 10.3 10e3/uL Final  . NEUT# 04/10/2016 4.8  1.5 - 6.5 10e3/uL Final  . HGB 04/10/2016 10.0* 11.6 - 15.9 g/dL Final  . HCT 04/10/2016 30.8* 34.8 - 46.6 % Final  . Platelets 04/10/2016 296  145 - 400 10e3/uL Final  . MCV 04/10/2016 74.7* 79.5 - 101.0 fL Final  . MCH 04/10/2016 24.2* 25.1 - 34.0 pg Final  . MCHC 04/10/2016 32.3  31.5 - 36.0 g/dL Final  . RBC 04/10/2016 4.13  3.70 - 5.45 10e6/uL Final  . RDW 04/10/2016 20.8* 11.2 - 14.5 % Final  . lymph# 04/10/2016 2.1  0.9 - 3.3 10e3/uL Final  . MONO# 04/10/2016 0.5  0.1 - 0.9 10e3/uL Final  . Eosinophils Absolute 04/10/2016 0.1  0.0 - 0.5 10e3/uL Final  . Basophils Absolute 04/10/2016 0.0  0.0 - 0.1 10e3/uL Final  . NEUT% 04/10/2016 63.7  38.4 - 76.8 % Final  . LYMPH% 04/10/2016 28.4  14.0 - 49.7 % Final  . MONO% 04/10/2016 6.7  0.0 - 14.0 % Final  . EOS% 04/10/2016 0.8  0.0 - 7.0 % Final  . BASO% 04/10/2016 0.4  0.0 - 2.0 % Final  . Glucose 04/10/2016 Negative  Negative mg/dL Final  . Bilirubin (Urine) 04/10/2016 Negative  Negative Final  . Ketones 04/10/2016 Negative  Negative mg/dL Final  . Specific Gravity, Urine 04/10/2016 1.005  1.003 - 1.035 Final  . Blood 04/10/2016 Trace  Negative Final  . pH 04/10/2016 6.0  4.6 - 8.0 Final  . Protein 04/10/2016  Negative  Negative- <30 mg/dL Final  . Urobilinogen, UR 04/10/2016 0.2  0.2 - 1 mg/dL Final  . Nitrite 04/10/2016 Negative  Negative Final  . Leukocyte Esterase 04/10/2016 Negative  Negative Final  . RBC / HPF 04/10/2016 0-2  0 - 2 Final  . WBC, UA 04/10/2016 7-10  0-2;Negative Final  . Bacteria, UA 04/10/2016 Few  Negative- Trace Final  . Epithelial Cells 04/10/2016 Moderate  Negative- Few Final    RADIOGRAPHIC STUDIES: No results found.  ASSESSMENT/PLAN:  UTI (urinary tract infection) Patient has a history of chronic UTIs.  She states that she has some mild dysuria when she urinates now.  She denies any hematuria or flank pain.  Chills denies any recent fevers or chills.  Urinalysis obtained today revealed questionable UTI.  Urine culture results are pending.  Since patient does have some of the clinical symptoms of a urinary tract infection-will prescribe Cipro antibiotics for treatment of the UTI.  Will also plan to review the urine culture results once they are resulted.  Patient was advised to call/return or go directly to the emergency department for any worsening symptoms whatsoever.  Rash Patient has had a recurrent mild rash to her bilateral buttocks near the gluteal cleft that comes and goes.  She states the first time the rash broke out.  It was much larger and left a darkened skin area.  She states that the rash has now returned; but is much milder.  She states the rash is somewhat pruritic; but has no pain.  She also states that she has a crack in the skin at her buttock cleft as well.  Exam today reveals very trace raised, pink rash to bilateral buttocks near the gluteal cleft.  There is no edema, surrounding erythema, tenderness, or drainage.  Patient does have a crack to the skin at the gluteal cleft as well.  No evidence of infection.  Attempted to obtain both a wound culture and a viral culture of the rash site.  Advised the patient that it was questionable if the  culture swabs would reveal any results-since there was minimal to no drainage at the sites.  In the meantime-patient was advised she may use some Neosporin ointment to the crack in her skin at the gluteal cleft.  She may also try some over-the-counter hydrocortisone cream to the rash.  If the rash.  Cultures reveal no findings-may want to consider a trial of Valtrex cream to the site or valacyclovir oral tablets.  Breast cancer of upper-outer quadrant of left female breast [facility]) Patient received cycle 3 of her texture./Carboplatin/Herceptin/Perjeta chemotherapy on 04/04/2016.  She is scheduled to return on 04/25/2016 for labs, visit, and her next cycle of chemotherapy.   Patient stated understanding of all instructions; and was in agreement with this plan of care. The patient knows to call the clinic with any problems, questions or concerns.   Total time spent with patient was 25 minutes;  with greater than 75 percent of that time spent in face to face counseling regarding patient's symptoms,  and coordination of care and follow up.  Disclaimer:This dictation was prepared with Dragon/digital dictation along with [employer]. Any transcriptional errors that result from this process are unintentional.  Drue Second, NP 04/10/2016

## 2016-04-10 NOTE — Telephone Encounter (Signed)
"  I have an appointment today and need lab added.  I feel burning starting when I urinate and urinating more frequently." Will notify [facility]. Advised she can have lab added after [facility] visit due to time of visit is at 9:00 am.

## 2016-04-10 NOTE — Assessment & Plan Note (Signed)
Patient has a history of chronic UTIs.  She states that she has some mild dysuria when she urinates now.  She denies any hematuria or flank pain.  Chills denies any recent fevers or chills.  Urinalysis obtained today revealed questionable UTI.  Urine culture results are pending.  Since patient does have some of the clinical symptoms of a urinary tract infection-will prescribe Cipro antibiotics for treatment of the UTI.  Will also plan to review the urine culture results once they are resulted.  Patient was advised to call/return or go directly to the emergency department for any worsening symptoms whatsoever.

## 2016-04-10 NOTE — Assessment & Plan Note (Signed)
Patient received cycle 3 of her texture./Carboplatin/Herceptin/Perjeta chemotherapy on 04/04/2016.  She is scheduled to return on 04/25/2016 for labs, visit, and her next cycle of chemotherapy.

## 2016-04-10 NOTE — Assessment & Plan Note (Signed)
Patient has had a recurrent mild rash to her bilateral buttocks near the gluteal cleft that comes and goes.  She states the first time the rash broke out.  It was much larger and left a darkened skin area.  She states that the rash has now returned; but is much milder.  She states the rash is somewhat pruritic; but has no pain.  She also states that she has a crack in the skin at her buttock cleft as well.  Exam today reveals very trace raised, pink rash to bilateral buttocks near the gluteal cleft.  There is no edema, surrounding erythema, tenderness, or drainage.  Patient does have a crack to the skin at the gluteal cleft as well.  No evidence of infection.  Attempted to obtain both a wound culture and a viral culture of the rash site.  Advised the patient that it was questionable if the culture swabs would reveal any results-since there was minimal to no drainage at the sites.  In the meantime-patient was advised she may use some Neosporin ointment to the crack in her skin at the gluteal cleft.  She may also try some over-the-counter hydrocortisone cream to the rash.  If the rash.  Cultures reveal no findings-may want to consider a trial of Valtrex cream to the site or valacyclovir oral tablets.

## 2016-04-11 LAB — URINE CULTURE: ORGANISM ID, BACTERIA: NO GROWTH

## 2016-04-12 ENCOUNTER — Telehealth: Payer: Self-pay

## 2016-04-12 ENCOUNTER — Telehealth: Payer: Self-pay | Admitting: *Deleted

## 2016-04-12 LAB — WOUND CULTURE

## 2016-04-12 NOTE — Telephone Encounter (Signed)
Called to follow up with pt. Pt currently at work and husband answered phone. Told husband to have pt call if she has increased blood in stool and to seek help in the emergency or urgent care after hours or if symptoms worsen. Gave husband call back number to have pt call us on Monday to update of symptoms. Husband verbalized understanding.

## 2016-04-12 NOTE — Telephone Encounter (Signed)
Pt reports that she had noticeable blood in her stool this morning. Happened 2 days ago as well. Not bright red. Was told to report any blood in stool.

## 2016-04-15 ENCOUNTER — Other Ambulatory Visit: Payer: Self-pay | Admitting: Nurse Practitioner

## 2016-04-15 ENCOUNTER — Telehealth: Payer: Self-pay | Admitting: Hematology and Oncology

## 2016-04-15 ENCOUNTER — Telehealth: Payer: Self-pay | Admitting: *Deleted

## 2016-04-15 DIAGNOSIS — C50412 Malignant neoplasm of upper-outer quadrant of left female breast: Secondary | ICD-10-CM

## 2016-04-15 DIAGNOSIS — R21 Rash and other nonspecific skin eruption: Secondary | ICD-10-CM

## 2016-04-15 MED ORDER — CEPHALEXIN 500 MG PO CAPS: 500.0000 mg | ORAL_CAPSULE | Freq: Four times a day (QID) | ORAL | 0 refills | Status: DC

## 2016-04-15 NOTE — Telephone Encounter (Signed)
I received the message regarding her blood per rectum. I called the patient inquiring about this. I left a message to call us back if she needs to be seen. If her bleeding stopped and we may not need to see her immediately. She will be seen by [place] with her next chemotherapy.

## 2016-04-15 NOTE — Telephone Encounter (Signed)
TCT patient to follow with appt from last week. Pt has a rash on her buttocks-cound culture + for strep B. Keflex called into [facility] per Selena Lesser, NP. Pt made aware of this. She voiced understanding. Pt had called earlier regarding blood in her stool. She states it happenede once , maybe 2 times though not consecutively.  She also states her stomach has been hurting at times-mostly when she eats.  She states milk seems to help. Advised to try tums and continue to check bowel movements for blood or blood in the commode or when she wipes.  Pt stated she will continue to check this and call if it continues.

## 2016-04-16 ENCOUNTER — Telehealth: Payer: Self-pay | Admitting: *Deleted

## 2016-04-16 DIAGNOSIS — R21 Rash and other nonspecific skin eruption: Secondary | ICD-10-CM

## 2016-04-16 DIAGNOSIS — C50412 Malignant neoplasm of upper-outer quadrant of left female breast: Secondary | ICD-10-CM

## 2016-04-16 MED ORDER — CEPHALEXIN 500 MG PO CAPS: 500.0000 mg | ORAL_CAPSULE | Freq: Four times a day (QID) | ORAL | 0 refills | Status: DC

## 2016-04-16 NOTE — Telephone Encounter (Signed)
"  I was to have a prescription called in yesterday and it was not called in.  Could you check on this?" Keflex was escribed to [facility].  "[facility] to [facility] [place]."  [facility] and [place] notified to cancel order to restock.  "I feel a cold coming on with head congestion.  Can I use OTC cold medicines?"  Denies any further symptoms or high blood pressure.  Suggested Mucinex with increased hydration and to talk with [facility] Pharmacist for decongestant recommendations.  Please call back if symptoms progress or fever develops of 100.5.  No further questions.

## 2016-04-17 ENCOUNTER — Telehealth: Payer: Self-pay | Admitting: Hematology and Oncology

## 2016-04-17 NOTE — Telephone Encounter (Signed)
No LOS per 04/04/16 visit.

## 2016-04-18 LAB — VIRUS CULTURE

## 2016-04-25 ENCOUNTER — Encounter: Payer: Self-pay | Admitting: *Deleted

## 2016-04-25 ENCOUNTER — Ambulatory Visit (HOSPITAL_BASED_OUTPATIENT_CLINIC_OR_DEPARTMENT_OTHER): Payer: 59 | Admitting: Hematology and Oncology

## 2016-04-25 ENCOUNTER — Other Ambulatory Visit (HOSPITAL_BASED_OUTPATIENT_CLINIC_OR_DEPARTMENT_OTHER): Payer: 59

## 2016-04-25 ENCOUNTER — Ambulatory Visit (HOSPITAL_BASED_OUTPATIENT_CLINIC_OR_DEPARTMENT_OTHER): Payer: 59

## 2016-04-25 ENCOUNTER — Encounter: Payer: Self-pay | Admitting: Hematology and Oncology

## 2016-04-25 DIAGNOSIS — Z171 Estrogen receptor negative status [ER-]: Principal | ICD-10-CM

## 2016-04-25 DIAGNOSIS — C773 Secondary and unspecified malignant neoplasm of axilla and upper limb lymph nodes: Secondary | ICD-10-CM | POA: Diagnosis not present

## 2016-04-25 DIAGNOSIS — R530 Neoplastic (malignant) related fatigue: Secondary | ICD-10-CM

## 2016-04-25 DIAGNOSIS — R197 Diarrhea, unspecified: Secondary | ICD-10-CM

## 2016-04-25 DIAGNOSIS — Z5112 Encounter for antineoplastic immunotherapy: Secondary | ICD-10-CM | POA: Diagnosis not present

## 2016-04-25 DIAGNOSIS — C50412 Malignant neoplasm of upper-outer quadrant of left female breast: Secondary | ICD-10-CM

## 2016-04-25 DIAGNOSIS — R110 Nausea: Secondary | ICD-10-CM

## 2016-04-25 DIAGNOSIS — Z5111 Encounter for antineoplastic chemotherapy: Secondary | ICD-10-CM

## 2016-04-25 DIAGNOSIS — L658 Other specified nonscarring hair loss: Secondary | ICD-10-CM

## 2016-04-25 LAB — CBC WITH DIFFERENTIAL/PLATELET
BASO%: 0.2 % (ref 0.0–2.0)
Basophils Absolute: 0 10*3/uL (ref 0.0–0.1)
EOS%: 0 % (ref 0.0–7.0)
Eosinophils Absolute: 0 10*3/uL (ref 0.0–0.5)
HCT: 33.7 % — ABNORMAL LOW (ref 34.8–46.6)
HGB: 11 g/dL — ABNORMAL LOW (ref 11.6–15.9)
LYMPH%: 17.4 % (ref 14.0–49.7)
MCH: 24.4 pg — ABNORMAL LOW (ref 25.1–34.0)
MCHC: 32.7 g/dL (ref 31.5–36.0)
MCV: 74.6 fL — ABNORMAL LOW (ref 79.5–101.0)
MONO#: 0.3 10*3/uL (ref 0.1–0.9)
MONO%: 3.3 % (ref 0.0–14.0)
NEUT%: 79.1 % — ABNORMAL HIGH (ref 38.4–76.8)
NEUTROS ABS: 8.2 10*3/uL — AB (ref 1.5–6.5)
PLATELETS: 288 10*3/uL (ref 145–400)
RBC: 4.52 10*6/uL (ref 3.70–5.45)
RDW: 22.1 % — ABNORMAL HIGH (ref 11.2–14.5)
WBC: 10.3 10*3/uL (ref 3.9–10.3)
lymph#: 1.8 10*3/uL (ref 0.9–3.3)

## 2016-04-25 LAB — COMPREHENSIVE METABOLIC PANEL
ALT: 18 U/L (ref 0–55)
AST: 23 U/L (ref 5–34)
Albumin: 4 g/dL (ref 3.5–5.0)
Alkaline Phosphatase: 97 U/L (ref 40–150)
Anion Gap: 10 mEq/L (ref 3–11)
BILIRUBIN TOTAL: 0.28 mg/dL (ref 0.20–1.20)
BUN: 11.5 mg/dL (ref 7.0–26.0)
CO2: 26 meq/L (ref 22–29)
CREATININE: 0.9 mg/dL (ref 0.6–1.1)
Calcium: 10.1 mg/dL (ref 8.4–10.4)
Chloride: 102 mEq/L (ref 98–109)
EGFR: >90 mL/min/{1.73_m2} (ref >90)
GLUCOSE: 125 mg/dL (ref 70–140)
Potassium: 4.3 mEq/L (ref 3.5–5.1)
SODIUM: 138 meq/L (ref 136–145)
TOTAL PROTEIN: 7.9 g/dL (ref 6.4–8.3)

## 2016-04-25 MED ORDER — TRASTUZUMAB CHEMO 150 MG IV SOLR
6.0000 mg/kg | Freq: Once | INTRAVENOUS | Status: AC
  Administered 2016-04-25: 588 mg via INTRAVENOUS
  Filled 2016-04-25: qty 28

## 2016-04-25 MED ORDER — HEPARIN SOD (PORK) LOCK FLUSH 100 UNIT/ML IV SOLN
500.0000 [IU] | Freq: Once | INTRAVENOUS | Status: AC | PRN
  Administered 2016-04-25: 500 [IU]
  Filled 2016-04-25: qty 5

## 2016-04-25 MED ORDER — ACETAMINOPHEN 325 MG PO TABS
ORAL_TABLET | ORAL | Status: AC
  Filled 2016-04-25: qty 2

## 2016-04-25 MED ORDER — PEGFILGRASTIM 6 MG/0.6ML ~~LOC~~ PSKT
6.0000 mg | PREFILLED_SYRINGE | Freq: Once | SUBCUTANEOUS | Status: AC
  Administered 2016-04-25: 6 mg via SUBCUTANEOUS
  Filled 2016-04-25: qty 0.6

## 2016-04-25 MED ORDER — DIPHENHYDRAMINE HCL 25 MG PO CAPS
ORAL_CAPSULE | ORAL | Status: AC
  Filled 2016-04-25: qty 2

## 2016-04-25 MED ORDER — ACETAMINOPHEN 325 MG PO TABS
650.0000 mg | ORAL_TABLET | Freq: Once | ORAL | Status: AC
  Administered 2016-04-25: 650 mg via ORAL

## 2016-04-25 MED ORDER — SODIUM CHLORIDE 0.9% FLUSH
10.0000 mL | INTRAVENOUS | Status: DC | PRN
  Administered 2016-04-25: 10 mL
  Filled 2016-04-25: qty 10

## 2016-04-25 MED ORDER — SODIUM CHLORIDE 0.9 % IV SOLN
700.0000 mg | Freq: Once | INTRAVENOUS | Status: AC
  Administered 2016-04-25: 700 mg via INTRAVENOUS
  Filled 2016-04-25: qty 70

## 2016-04-25 MED ORDER — SODIUM CHLORIDE 0.9 % IV SOLN
420.0000 mg | Freq: Once | INTRAVENOUS | Status: AC
  Administered 2016-04-25: 420 mg via INTRAVENOUS
  Filled 2016-04-25: qty 14

## 2016-04-25 MED ORDER — DOCETAXEL CHEMO INJECTION 160 MG/16ML
75.0000 mg/m2 | Freq: Once | INTRAVENOUS | Status: AC
  Administered 2016-04-25: 160 mg via INTRAVENOUS
  Filled 2016-04-25: qty 16

## 2016-04-25 MED ORDER — PALONOSETRON HCL INJECTION 0.25 MG/5ML
0.2500 mg | Freq: Once | INTRAVENOUS | Status: AC
  Administered 2016-04-25: 0.25 mg via INTRAVENOUS

## 2016-04-25 MED ORDER — PALONOSETRON HCL INJECTION 0.25 MG/5ML
INTRAVENOUS | Status: AC
  Filled 2016-04-25: qty 5

## 2016-04-25 MED ORDER — SODIUM CHLORIDE 0.9 % IV SOLN
Freq: Once | INTRAVENOUS | Status: AC
  Administered 2016-04-25: 11:00:00 via INTRAVENOUS

## 2016-04-25 MED ORDER — DEXAMETHASONE SODIUM PHOSPHATE 10 MG/ML IJ SOLN
INTRAMUSCULAR | Status: AC
  Filled 2016-04-25: qty 1

## 2016-04-25 MED ORDER — DIPHENHYDRAMINE HCL 25 MG PO CAPS
50.0000 mg | ORAL_CAPSULE | Freq: Once | ORAL | Status: AC
  Administered 2016-04-25: 50 mg via ORAL

## 2016-04-25 MED ORDER — DEXAMETHASONE SODIUM PHOSPHATE 10 MG/ML IJ SOLN
10.0000 mg | Freq: Once | INTRAMUSCULAR | Status: AC
  Administered 2016-04-25: 10 mg via INTRAVENOUS

## 2016-04-25 NOTE — Progress Notes (Signed)
Patient Care Team: Jearld Fenton, NP as PCP - General (Internal Medicine) Servando Salina, MD as Consulting Physician (Obstetrics and Gynecology)  DIAGNOSIS:  Encounter Diagnosis  Name Primary?  . Malignant neoplasm of upper-outer quadrant of left breast in female, estrogen receptor negative ([place])     SUMMARY OF ONCOLOGIC HISTORY:   Malignant neoplasm of upper-outer quadrant of left breast in female, estrogen receptor negative ([place])   12/19/2015 Initial Diagnosis    Left breast biopsy: IDC with DCI S, grade 3, ER 0%, PR 0%, KIC system 40%, her 2 positive ratio 6.15, copy number 16.5; palpable lump left breast 1.6 cm irregular oval mass, T1cN0 stage I a clinical stage      01/17/2016 Surgery    Left Lumpectomy: IDC 4.1 cm with DCIS, Margin 0.1 cm, 1/3 LN Positive, ER/PR Neg, Her 2 Pos with Ki 67 of 40%, T2N1 (stage 2B)      02/22/2016 -  Chemotherapy    Adjuvant chemotherapy with TCH Perjeta 6 cycles followed by Herceptin Perjeta maintenance        CHIEF COMPLIANT: Cycle 4 TCH Perjeta  INTERVAL HISTORY: Brianna James is a 36 year old with above-mentioned history of left breast cancer treated with lumpectomy and is now on adjuvant chemotherapy with TCH Perjeta. She had diarrhea with chemotherapy which improved with Imodium. She also had fatigue and nausea issues That seem to be worse during the last cycle. She also complained of bright red blood per rectum intermittently. This is accompanied by slight discomfort when she passes bowel movements.  REVIEW OF SYSTEMS:   Constitutional: Denies fevers, chills or abnormal weight loss Eyes: Denies blurriness of vision Ears, nose, mouth, throat, and face: Denies mucositis or sore throat Respiratory: Denies cough, dyspnea or wheezes Cardiovascular: Denies palpitation, chest discomfort Gastrointestinal: Diarrhea, intermittent bright red blood per rectum Skin: Denies abnormal skin rashes Lymphatics: Denies new lymphadenopathy or  easy bruising Neurological:Denies numbness, tingling or new weaknesses Behavioral/Psych: Mood is stable, no new changes  Extremities: No lower extremity edema Breast:  denies any pain or lumps or nodules in either breasts All other systems were reviewed with the patient and are negative.  I have reviewed the past medical history, past surgical history, social history and family history with the patient and they are unchanged from previous note.  ALLERGIES:  is allergic to other; oxycodone; and peanuts [peanut oil].  MEDICATIONS:  Current Outpatient Prescriptions  Medication Sig Dispense Refill  . cephALEXin (KEFLEX) 500 MG capsule Take 1 capsule (500 mg total) by mouth 4 (four) times daily. 40 capsule 0  . dexamethasone (DECADRON) 4 MG tablet Take 1 tablet (4 mg total) by mouth daily. Take 1 tablet daily before and 1 tablet day after chemotherapy 30 tablet 1  . lidocaine-prilocaine (EMLA) cream Apply to affected area once 30 g 3  . loratadine (CLARITIN) 10 MG tablet Take 10 mg by mouth daily.    Marland Kitchen LORazepam (ATIVAN) 0.5 MG tablet Take 1 tablet (0.5 mg total) by mouth at bedtime. As needed for sleep 30 tablet 0  . ondansetron (ZOFRAN) 8 MG tablet Take 1 tablet (8 mg total) by mouth 2 (two) times daily as needed for refractory nausea / vomiting. Start on day 3 after chemo. 30 tablet 1  . prochlorperazine (COMPAZINE) 10 MG tablet Take 1 tablet (10 mg total) by mouth every 6 (six) hours as needed (Nausea or vomiting). 30 tablet 1   No current facility-administered medications for this visit.     PHYSICAL EXAMINATION: ECOG  PERFORMANCE STATUS: 1 - Symptomatic but completely ambulatory  Vitals:   04/25/16 1014  BP: 124/65  Pulse: 99  Resp: 18  Temp: 98.1 F (36.7 C)   Filed Weights   04/25/16 1014  Weight: 236 lb 8 oz (107.3 kg)    GENERAL:alert, no distress and comfortable SKIN: skin color, texture, turgor are normal, no rashes or significant lesions EYES: normal, Conjunctiva are  pink and non-injected, sclera clear OROPHARYNX:no exudate, no erythema and lips, buccal mucosa, and tongue normal  NECK: supple, thyroid normal size, non-tender, without nodularity LYMPH:  no palpable lymphadenopathy in the cervical, axillary or inguinal LUNGS: clear to auscultation and percussion with normal breathing effort HEART: regular rate & rhythm and no murmurs and no lower extremity edema ABDOMEN:abdomen soft, non-tender and normal bowel sounds MUSCULOSKELETAL:no cyanosis of digits and no clubbing  NEURO: alert & oriented x 3 with fluent speech, no focal motor/sensory deficits EXTREMITIES: No lower extremity edema BREAST: No palpable masses or nodules in either right or left breasts. No palpable axillary supraclavicular or infraclavicular adenopathy no breast tenderness or nipple discharge. (exam performed in the presence of a chaperone)  LABORATORY DATA:  I have reviewed the data as listed   Chemistry      Component Value Date/Time   NA 137 04/04/2016 1033   K 4.1 04/04/2016 1033   CL 98 (L) 02/29/2016 0148   CO2 21 (L) 04/04/2016 1033   BUN 15.5 04/04/2016 1033   CREATININE 0.9 04/04/2016 1033      Component Value Date/Time   CALCIUM 9.7 04/04/2016 1033   ALKPHOS 94 04/04/2016 1033   AST 22 04/04/2016 1033   ALT 17 04/04/2016 1033   BILITOT 0.26 04/04/2016 1033       Lab Results  Component Value Date   WBC 10.3 04/25/2016   HGB 11.0 (L) 04/25/2016   HCT 33.7 (L) 04/25/2016   MCV 74.6 (L) 04/25/2016   PLT 288 04/25/2016   NEUTROABS 8.2 (H) 04/25/2016    ASSESSMENT & PLAN:  Malignant neoplasm of upper-outer quadrant of left breast in female, estrogen receptor negative ([place]) 01/17/16: Left Lumpectomy: 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 Adjuvant chemotherapy with TCHPX 6 cycles followed by Herceptin-Perjetamaintenance for one year 2. Followed by adjuvant  radiation ---------------------------------------------------------------------------------------------------------------------------------------------------- Current treatment: Cycle 4 TCHP Chemotherapy toxicities: 1. Diarrhea: Improved with Imodium (rash in the buttock related to diarrhea) 2. UTI treated by symptom management with antibiotic 3. Alopecia 4. Nausea grade 2: Instructed the patient to take Zofran and Compazine more often 5. Fatigue due to chemotherapy: Appears to be worse after the last cycle  Monitoring closely for chemotherapy toxicities Return to clinic in 3 weeks for cycle 5   I spent 25 minutes talking to the patient of which more than half was spent in counseling and coordination of care.  No orders of the defined types were placed in this encounter.  The patient has a good understanding of the overall plan. she agrees with it. she will call with any problems that may develop before the next visit here.   Rulon Eisenmenger, MD 04/25/16

## 2016-04-25 NOTE — Patient Instructions (Signed)
[facility] Discharge Instructions for Patients Receiving Chemotherapy  Today you received the following chemotherapy agents Herceptin, Perjeta, Taxotere and Carboplatin  To help prevent nausea and vomiting after your treatment, we encourage you to take your nausea medication as directed. No Zofran for 3 days. Take Compazine instead.    If you develop nausea and vomiting that is not controlled by your nausea medication, call the clinic.   BELOW ARE SYMPTOMS THAT SHOULD BE REPORTED IMMEDIATELY:  *FEVER GREATER THAN 100.5 F  *CHILLS WITH OR WITHOUT FEVER  NAUSEA AND VOMITING THAT IS NOT CONTROLLED WITH YOUR NAUSEA MEDICATION  *UNUSUAL SHORTNESS OF BREATH  *UNUSUAL BRUISING OR BLEEDING  TENDERNESS IN MOUTH AND THROAT WITH OR WITHOUT PRESENCE OF ULCERS  *URINARY PROBLEMS  *BOWEL PROBLEMS  UNUSUAL RASH Items with * indicate a potential emergency and should be followed up as soon as possible.  Feel free to call the clinic you have any questions or concerns. The clinic phone number is [phone].  Please show the CHEMO ALERT CARD at check-in to the Emergency Department and triage nurse.   

## 2016-04-25 NOTE — Assessment & Plan Note (Signed)
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 Adjuvant chemotherapy with TCHPX 6 cycles followed by Herceptin-Perjetamaintenance for one year 2. Followed by adjuvant radiation ---------------------------------------------------------------------------------------------------------------------------------------------------- Current treatment: Cycle 4 TCHP Chemotherapy toxicities: 1. Diarrhea: Improved with Imodium (rash in the buttock related to diarrhea) 2. UTI treated by symptom management with antibiotic 3. Alopecia 4. Nausea grade 1: Improved with Zofran and Compazine 5. Fatigue due to chemotherapy last from day 3 today 8  Monitoring closely for chemotherapy toxicities Return to clinic in 3 weeks for cycle 5

## 2016-04-27 ENCOUNTER — Telehealth: Payer: Self-pay

## 2016-04-27 NOTE — Telephone Encounter (Signed)
Called and left a message with new appts per 2/8 los

## 2016-05-08 ENCOUNTER — Telehealth (HOSPITAL_COMMUNITY): Payer: Self-pay | Admitting: Vascular Surgery

## 2016-05-08 NOTE — Telephone Encounter (Signed)
Left pt message to make f/u appt w/ db w/ ECHO

## 2016-05-16 ENCOUNTER — Ambulatory Visit (HOSPITAL_BASED_OUTPATIENT_CLINIC_OR_DEPARTMENT_OTHER): Payer: 59

## 2016-05-16 ENCOUNTER — Ambulatory Visit (HOSPITAL_BASED_OUTPATIENT_CLINIC_OR_DEPARTMENT_OTHER): Payer: 59 | Admitting: Hematology and Oncology

## 2016-05-16 ENCOUNTER — Encounter: Payer: Self-pay | Admitting: Hematology and Oncology

## 2016-05-16 ENCOUNTER — Encounter: Payer: Self-pay | Admitting: *Deleted

## 2016-05-16 DIAGNOSIS — C773 Secondary and unspecified malignant neoplasm of axilla and upper limb lymph nodes: Secondary | ICD-10-CM

## 2016-05-16 DIAGNOSIS — Z5111 Encounter for antineoplastic chemotherapy: Secondary | ICD-10-CM | POA: Diagnosis not present

## 2016-05-16 DIAGNOSIS — R197 Diarrhea, unspecified: Secondary | ICD-10-CM

## 2016-05-16 DIAGNOSIS — L658 Other specified nonscarring hair loss: Secondary | ICD-10-CM | POA: Diagnosis not present

## 2016-05-16 DIAGNOSIS — Z5112 Encounter for antineoplastic immunotherapy: Secondary | ICD-10-CM | POA: Diagnosis not present

## 2016-05-16 DIAGNOSIS — R530 Neoplastic (malignant) related fatigue: Secondary | ICD-10-CM

## 2016-05-16 DIAGNOSIS — Z171 Estrogen receptor negative status [ER-]: Principal | ICD-10-CM

## 2016-05-16 DIAGNOSIS — C50412 Malignant neoplasm of upper-outer quadrant of left female breast: Secondary | ICD-10-CM | POA: Diagnosis not present

## 2016-05-16 DIAGNOSIS — R110 Nausea: Secondary | ICD-10-CM | POA: Diagnosis not present

## 2016-05-16 LAB — COMPREHENSIVE METABOLIC PANEL
ALT: 15 U/L (ref 0–55)
AST: 22 U/L (ref 5–34)
Albumin: 3.9 g/dL (ref 3.5–5.0)
Alkaline Phosphatase: 82 U/L (ref 40–150)
Anion Gap: 9 mEq/L (ref 3–11)
BUN: 12.3 mg/dL (ref 7.0–26.0)
CHLORIDE: 104 meq/L (ref 98–109)
CO2: 26 meq/L (ref 22–29)
Calcium: 9.8 mg/dL (ref 8.4–10.4)
Creatinine: 0.9 mg/dL (ref 0.6–1.1)
EGFR: >90 mL/min/{1.73_m2} (ref >90)
GLUCOSE: 109 mg/dL (ref 70–140)
Potassium: 4 mEq/L (ref 3.5–5.1)
SODIUM: 140 meq/L (ref 136–145)
Total Bilirubin: 0.3 mg/dL (ref 0.20–1.20)
Total Protein: 7.4 g/dL (ref 6.4–8.3)

## 2016-05-16 LAB — CBC WITH DIFFERENTIAL/PLATELET
BASO%: 0.2 % (ref 0.0–2.0)
Basophils Absolute: 0 10*3/uL (ref 0.0–0.1)
EOS%: 0 % (ref 0.0–7.0)
Eosinophils Absolute: 0 10*3/uL (ref 0.0–0.5)
HCT: 31.2 % — ABNORMAL LOW (ref 34.8–46.6)
HGB: 10.1 g/dL — ABNORMAL LOW (ref 11.6–15.9)
LYMPH%: 24.6 % (ref 14.0–49.7)
MCH: 24.2 pg — ABNORMAL LOW (ref 25.1–34.0)
MCHC: 32.4 g/dL (ref 31.5–36.0)
MCV: 74.5 fL — ABNORMAL LOW (ref 79.5–101.0)
MONO#: 0.5 10*3/uL (ref 0.1–0.9)
MONO%: 5.7 % (ref 0.0–14.0)
NEUT%: 69.5 % (ref 38.4–76.8)
NEUTROS ABS: 6.6 10*3/uL — AB (ref 1.5–6.5)
PLATELETS: 262 10*3/uL (ref 145–400)
RBC: 4.19 10*6/uL (ref 3.70–5.45)
RDW: 23.7 % — ABNORMAL HIGH (ref 11.2–14.5)
WBC: 9.6 10*3/uL (ref 3.9–10.3)
lymph#: 2.4 10*3/uL (ref 0.9–3.3)

## 2016-05-16 MED ORDER — SODIUM CHLORIDE 0.9 % IV SOLN
700.0000 mg | Freq: Once | INTRAVENOUS | Status: AC
  Administered 2016-05-16: 700 mg via INTRAVENOUS
  Filled 2016-05-16: qty 70

## 2016-05-16 MED ORDER — LIDOCAINE-PRILOCAINE 2.5-2.5 % EX CREA: TOPICAL_CREAM | CUTANEOUS | 3 refills | Status: DC

## 2016-05-16 MED ORDER — SODIUM CHLORIDE 0.9 % IV SOLN
Freq: Once | INTRAVENOUS | Status: AC
  Administered 2016-05-16: 11:00:00 via INTRAVENOUS

## 2016-05-16 MED ORDER — ACETAMINOPHEN 325 MG PO TABS
ORAL_TABLET | ORAL | Status: AC
  Filled 2016-05-16: qty 2

## 2016-05-16 MED ORDER — PEGFILGRASTIM 6 MG/0.6ML ~~LOC~~ PSKT
6.0000 mg | PREFILLED_SYRINGE | Freq: Once | SUBCUTANEOUS | Status: AC
  Administered 2016-05-16: 6 mg via SUBCUTANEOUS
  Filled 2016-05-16: qty 0.6

## 2016-05-16 MED ORDER — HEPARIN SOD (PORK) LOCK FLUSH 100 UNIT/ML IV SOLN
500.0000 [IU] | Freq: Once | INTRAVENOUS | Status: AC | PRN
  Administered 2016-05-16: 500 [IU]
  Filled 2016-05-16: qty 5

## 2016-05-16 MED ORDER — SODIUM CHLORIDE 0.9% FLUSH
10.0000 mL | INTRAVENOUS | Status: DC | PRN
  Administered 2016-05-16: 10 mL
  Filled 2016-05-16: qty 10

## 2016-05-16 MED ORDER — PALONOSETRON HCL INJECTION 0.25 MG/5ML
0.2500 mg | Freq: Once | INTRAVENOUS | Status: AC
  Administered 2016-05-16: 0.25 mg via INTRAVENOUS

## 2016-05-16 MED ORDER — ACETAMINOPHEN 325 MG PO TABS
650.0000 mg | ORAL_TABLET | Freq: Once | ORAL | Status: AC
  Administered 2016-05-16: 650 mg via ORAL

## 2016-05-16 MED ORDER — DIPHENHYDRAMINE HCL 25 MG PO CAPS
ORAL_CAPSULE | ORAL | Status: AC
  Filled 2016-05-16: qty 2

## 2016-05-16 MED ORDER — DEXAMETHASONE SODIUM PHOSPHATE 10 MG/ML IJ SOLN
INTRAMUSCULAR | Status: AC
  Filled 2016-05-16: qty 1

## 2016-05-16 MED ORDER — PALONOSETRON HCL INJECTION 0.25 MG/5ML
INTRAVENOUS | Status: AC
  Filled 2016-05-16: qty 5

## 2016-05-16 MED ORDER — SODIUM CHLORIDE 0.9 % IV SOLN
Freq: Once | INTRAVENOUS | Status: AC
  Administered 2016-05-16: 13:00:00 via INTRAVENOUS
  Filled 2016-05-16: qty 5

## 2016-05-16 MED ORDER — SODIUM CHLORIDE 0.9 % IV SOLN
420.0000 mg | Freq: Once | INTRAVENOUS | Status: AC
  Administered 2016-05-16: 420 mg via INTRAVENOUS
  Filled 2016-05-16: qty 14

## 2016-05-16 MED ORDER — DOCETAXEL CHEMO INJECTION 160 MG/16ML
65.0000 mg/m2 | Freq: Once | INTRAVENOUS | Status: AC
  Administered 2016-05-16: 140 mg via INTRAVENOUS
  Filled 2016-05-16: qty 14

## 2016-05-16 MED ORDER — SODIUM CHLORIDE 0.9 % IV SOLN: Freq: Once | INTRAVENOUS | Status: AC

## 2016-05-16 MED ORDER — DIPHENHYDRAMINE HCL 25 MG PO CAPS
50.0000 mg | ORAL_CAPSULE | Freq: Once | ORAL | Status: AC
  Administered 2016-05-16: 50 mg via ORAL

## 2016-05-16 MED ORDER — DEXAMETHASONE SODIUM PHOSPHATE 10 MG/ML IJ SOLN
10.0000 mg | Freq: Once | INTRAMUSCULAR | Status: AC
  Administered 2016-05-16: 10 mg via INTRAVENOUS

## 2016-05-16 MED ORDER — TRASTUZUMAB CHEMO 150 MG IV SOLR
6.0000 mg/kg | Freq: Once | INTRAVENOUS | Status: AC
  Administered 2016-05-16: 588 mg via INTRAVENOUS
  Filled 2016-05-16: qty 28

## 2016-05-16 NOTE — Patient Instructions (Signed)
Cancer Center Discharge Instructions for Patients Receiving Chemotherapy  Today you received the following chemotherapy agents Herceptin, Perjeta, Taxotere and Carboplatin  To help prevent nausea and vomiting after your treatment, we encourage you to take your nausea medication as directed. No Zofran for 3 days. Take Compazine instead.    If you develop nausea and vomiting that is not controlled by your nausea medication, call the clinic.   BELOW ARE SYMPTOMS THAT SHOULD BE REPORTED IMMEDIATELY:  *FEVER GREATER THAN 100.5 F  *CHILLS WITH OR WITHOUT FEVER  NAUSEA AND VOMITING THAT IS NOT CONTROLLED WITH YOUR NAUSEA MEDICATION  *UNUSUAL SHORTNESS OF BREATH  *UNUSUAL BRUISING OR BLEEDING  TENDERNESS IN MOUTH AND THROAT WITH OR WITHOUT PRESENCE OF ULCERS  *URINARY PROBLEMS  *BOWEL PROBLEMS  UNUSUAL RASH Items with * indicate a potential emergency and should be followed up as soon as possible.  Feel free to call the clinic you have any questions or concerns. The clinic phone number is [phone].  Please show the CHEMO ALERT CARD at check-in to the Emergency Department and triage nurse.   

## 2016-05-16 NOTE — Progress Notes (Signed)
Patient Care Team: Jearld Fenton, NP as PCP - General (Internal Medicine) Servando Salina, MD as Consulting Physician (Obstetrics and Gynecology)  DIAGNOSIS:  Encounter Diagnosis  Name Primary?  . Malignant neoplasm of upper-outer quadrant of left breast in female, estrogen receptor negative ([place])     SUMMARY OF ONCOLOGIC HISTORY:   Malignant neoplasm of upper-outer quadrant of left breast in female, estrogen receptor negative ([place])   12/19/2015 Initial Diagnosis    Left breast biopsy: IDC with DCI S, grade 3, ER 0%, PR 0%, KIC system 40%, her 2 positive ratio 6.15, copy number 16.5; palpable lump left breast 1.6 cm irregular oval mass, T1cN0 stage I a clinical stage      01/17/2016 Surgery    Left Lumpectomy: IDC 4.1 cm with DCIS, Margin 0.1 cm, 1/3 LN Positive, ER/PR Neg, Her 2 Pos with Ki 67 of 40%, T2N1 (stage 2B)      02/22/2016 -  Chemotherapy    Adjuvant chemotherapy with TCH Perjeta 6 cycles followed by Herceptin Perjeta maintenance        CHIEF COMPLIANT: Cycle 5 TCHP  INTERVAL HISTORY: Brianna James is a 36 year old with above-mentioned history left breast cancer treated with lumpectomy and is currently on adjuvant chemotherapy with TCH Perjeta. Today is cycle 5 of treatment. Her major complaints have been diarrhea which is intermittent along with nausea and fatigue. Nausea had been much worse this last cycle lasting 4-5 days. Her fatigue is profound but she is still able to exercise 30 minutes every day. Some days she does not have much energy.  REVIEW OF SYSTEMS:   Constitutional: Denies fevers, chills or abnormal weight loss Eyes: Denies blurriness of vision Ears, nose, mouth, throat, and face: Denies mucositis or sore throat Respiratory: Denies cough, dyspnea or wheezes Cardiovascular: Denies palpitation, chest discomfort Gastrointestinal:  Denies nausea, heartburn or change in bowel habits Skin: Denies abnormal skin rashes Lymphatics: Denies new  lymphadenopathy or easy bruising Neurological:Denies numbness, tingling or new weaknesses Behavioral/Psych: Mood is stable, no new changes  Extremities: No lower extremity edema  All other systems were reviewed with the patient and are negative.  I have reviewed the past medical history, past surgical history, social history and family history with the patient and they are unchanged from previous note.  ALLERGIES:  is allergic to other; oxycodone; and peanuts [peanut oil].  MEDICATIONS:  Current Outpatient Prescriptions  Medication Sig Dispense Refill  . dexamethasone (DECADRON) 4 MG tablet Take 1 tablet (4 mg total) by mouth daily. Take 1 tablet daily before and 1 tablet day after chemotherapy 30 tablet 1  . lidocaine-prilocaine (EMLA) cream Apply to affected area once 30 g 3  . loratadine (CLARITIN) 10 MG tablet Take 10 mg by mouth daily.    Marland Kitchen LORazepam (ATIVAN) 0.5 MG tablet Take 1 tablet (0.5 mg total) by mouth at bedtime. As needed for sleep 30 tablet 0  . ondansetron (ZOFRAN) 8 MG tablet Take 1 tablet (8 mg total) by mouth 2 (two) times daily as needed for refractory nausea / vomiting. Start on day 3 after chemo. 30 tablet 1  . prochlorperazine (COMPAZINE) 10 MG tablet Take 1 tablet (10 mg total) by mouth every 6 (six) hours as needed (Nausea or vomiting). 30 tablet 1   No current facility-administered medications for this visit.    Facility-Administered Medications Ordered in Other Visits  Medication Dose Route Frequency Provider Last Rate Last Dose  . CARBOplatin (PARAPLATIN) 700 mg in sodium chloride 0.9 % 250 mL  chemo infusion  700 mg Intravenous Once Nicholas Lose, MD      . DOCEtaxel (TAXOTERE) 140 mg in dextrose 5 % 250 mL chemo infusion  65 mg/m2 (Treatment Plan Recorded) Intravenous Once Nicholas Lose, MD      . fosaprepitant (EMEND) 150 mg in sodium chloride 0.9 % 145 mL IVPB   Intravenous Once Nicholas Lose, MD      . heparin lock flush 100 unit/mL  500 Units Intracatheter  Once PRN Nicholas Lose, MD      . pegfilgrastim (NEULASTA ONPRO KIT) injection 6 mg  6 mg Subcutaneous Once Nicholas Lose, MD      . pertuzumab (PERJETA) 420 mg in sodium chloride 0.9 % 250 mL chemo infusion  420 mg Intravenous Once Nicholas Lose, MD      . sodium chloride flush (NS) 0.9 % injection 10 mL  10 mL Intracatheter PRN Nicholas Lose, MD        PHYSICAL EXAMINATION: ECOG PERFORMANCE STATUS: 1 - Symptomatic but completely ambulatory  Vitals:   05/16/16 1018  BP: 123/68  Pulse: 92  Resp: 18  Temp: 98.4 F (36.9 C)   Filed Weights   05/16/16 1018  Weight: 231 lb (104.8 kg)    GENERAL:alert, no distress and comfortable SKIN: skin color, texture, turgor are normal, no rashes or significant lesions EYES: normal, Conjunctiva are pink and non-injected, sclera clear OROPHARYNX:no exudate, no erythema and lips, buccal mucosa, and tongue normal  NECK: supple, thyroid normal size, non-tender, without nodularity LYMPH:  no palpable lymphadenopathy in the cervical, axillary or inguinal LUNGS: clear to auscultation and percussion with normal breathing effort HEART: regular rate & rhythm and no murmurs and no lower extremity edema ABDOMEN:abdomen soft, non-tender and normal bowel sounds MUSCULOSKELETAL:no cyanosis of digits and no clubbing  NEURO: alert & oriented x 3 with fluent speech, no focal motor/sensory deficits EXTREMITIES: No lower extremity edema  LABORATORY DATA:  I have reviewed the data as listed   Chemistry      Component Value Date/Time   NA 140 05/16/2016 0959   K 4.0 05/16/2016 0959   CL 98 (L) 02/29/2016 0148   CO2 26 05/16/2016 0959   BUN 12.3 05/16/2016 0959   CREATININE 0.9 05/16/2016 0959      Component Value Date/Time   CALCIUM 9.8 05/16/2016 0959   ALKPHOS 82 05/16/2016 0959   AST 22 05/16/2016 0959   ALT 15 05/16/2016 0959   BILITOT 0.30 05/16/2016 0959       Lab Results  Component Value Date   WBC 9.6 05/16/2016   HGB 10.1 (L) 05/16/2016    HCT 31.2 (L) 05/16/2016   MCV 74.5 (L) 05/16/2016   PLT 262 05/16/2016   NEUTROABS 6.6 (H) 05/16/2016    ASSESSMENT & PLAN:  Malignant neoplasm of upper-outer quadrant of left breast in female, estrogen receptor negative ([place]) 01/17/16: Left Lumpectomy: 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 Adjuvant chemotherapy with TCHPX 6 cycles followed by Herceptin-Perjetamaintenance for one year 2. Followed by adjuvant radiation ---------------------------------------------------------------------------------------------------------------------------------------------------- Current treatment: Cycle 5 TCHP Chemotherapy toxicities: 1. Diarrhea: Improved with Imodium (rash in the buttock related to diarrhea) 2. UTI treated by symptom management with antibiotic 3. Alopecia 4. Nausea grade 2: And Zofran with Compazine. I will add Emend to her regimen with the cycle. I will also decrease the dosage of Taxotere with cycle 5. 5. Fatigue due  to chemotherapy: Appears to be worse after the last cycle  Monitoring closely for chemotherapy toxicities Return to clinic in 3 weeks for cycle 6 I will send a request for adjuvant radiation to begin after chemotherapy is complete She will continue with Herceptin and Perjeta maintenance for 1 year total therapy  I spent 25 minutes talking to the patient of which more than half was spent in counseling and coordination of care.  No orders of the defined types were placed in this encounter.  The patient has a good understanding of the overall plan. she agrees with it. she will call with any problems that may develop before the next visit here.   Rulon Eisenmenger, MD 05/16/16

## 2016-05-16 NOTE — Assessment & Plan Note (Signed)
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 Adjuvant chemotherapy with TCHPX 6 cycles followed by Herceptin-Perjetamaintenance for one year 2. Followed by adjuvant radiation ---------------------------------------------------------------------------------------------------------------------------------------------------- Current treatment: Cycle 5 TCHP Chemotherapy toxicities: 1. Diarrhea: Improved with Imodium (rash in the buttock related to diarrhea) 2. UTI treated by symptom management with antibiotic 3. Alopecia 4. Nausea grade 2: Instructed the patient to take Zofran and Compazine more often 5. Fatigue due to chemotherapy: Appears to be worse after the last cycle  Monitoring closely for chemotherapy toxicities Return to clinic in 3 weeks for cycle 6 I will send a request for adjuvant radiation to begin after chemotherapy is complete She will continue with Herceptin and Perjeta maintenance for 1 year total therapy

## 2016-05-17 ENCOUNTER — Encounter: Payer: Self-pay | Admitting: Radiation Oncology

## 2016-05-21 ENCOUNTER — Other Ambulatory Visit: Payer: Self-pay

## 2016-05-21 ENCOUNTER — Ambulatory Visit (HOSPITAL_BASED_OUTPATIENT_CLINIC_OR_DEPARTMENT_OTHER): Payer: 59

## 2016-05-21 ENCOUNTER — Encounter: Payer: Self-pay | Admitting: Adult Health

## 2016-05-21 ENCOUNTER — Ambulatory Visit (HOSPITAL_COMMUNITY)
Admission: RE | Admit: 2016-05-21 | Discharge: 2016-05-21 | Disposition: A | Discharge status: Home / Self Care | Payer: 59 | Source: Ambulatory Visit | Attending: Adult Health | Admitting: Adult Health

## 2016-05-21 ENCOUNTER — Other Ambulatory Visit (HOSPITAL_BASED_OUTPATIENT_CLINIC_OR_DEPARTMENT_OTHER): Payer: 59

## 2016-05-21 ENCOUNTER — Other Ambulatory Visit: Payer: Self-pay | Admitting: Emergency Medicine

## 2016-05-21 ENCOUNTER — Ambulatory Visit (HOSPITAL_BASED_OUTPATIENT_CLINIC_OR_DEPARTMENT_OTHER): Payer: 59 | Admitting: Adult Health

## 2016-05-21 ENCOUNTER — Telehealth: Payer: Self-pay | Admitting: Emergency Medicine

## 2016-05-21 VITALS — BP 96/54 | HR 89 | Temp 98.4°F | Resp 18 | Ht 67.0 in | Wt 230.7 lb

## 2016-05-21 DIAGNOSIS — R112 Nausea with vomiting, unspecified: Secondary | ICD-10-CM

## 2016-05-21 DIAGNOSIS — K5903 Drug induced constipation: Secondary | ICD-10-CM

## 2016-05-21 DIAGNOSIS — C50412 Malignant neoplasm of upper-outer quadrant of left female breast: Secondary | ICD-10-CM

## 2016-05-21 DIAGNOSIS — K5900 Constipation, unspecified: Secondary | ICD-10-CM | POA: Diagnosis not present

## 2016-05-21 DIAGNOSIS — E860 Dehydration: Secondary | ICD-10-CM

## 2016-05-21 DIAGNOSIS — R109 Unspecified abdominal pain: Secondary | ICD-10-CM | POA: Insufficient documentation

## 2016-05-21 DIAGNOSIS — K5909 Other constipation: Secondary | ICD-10-CM

## 2016-05-21 DIAGNOSIS — Z171 Estrogen receptor negative status [ER-]: Secondary | ICD-10-CM | POA: Diagnosis not present

## 2016-05-21 DIAGNOSIS — C773 Secondary and unspecified malignant neoplasm of axilla and upper limb lymph nodes: Secondary | ICD-10-CM | POA: Diagnosis not present

## 2016-05-21 LAB — CBC WITH DIFFERENTIAL/PLATELET
BASO%: 0.7 % (ref 0.0–2.0)
BASOS ABS: 0.1 10*3/uL (ref 0.0–0.1)
EOS ABS: 0.1 10*3/uL (ref 0.0–0.5)
EOS%: 0.5 % (ref 0.0–7.0)
HCT: 32.3 % — ABNORMAL LOW (ref 34.8–46.6)
HGB: 10.1 g/dL — ABNORMAL LOW (ref 11.6–15.9)
LYMPH%: 16.3 % (ref 14.0–49.7)
MCH: 23.5 pg — AB (ref 25.1–34.0)
MCHC: 31.3 g/dL — AB (ref 31.5–36.0)
MCV: 75 fL — AB (ref 79.5–101.0)
MONO#: 0.4 10*3/uL (ref 0.1–0.9)
MONO%: 2.6 % (ref 0.0–14.0)
NEUT#: 11 10*3/uL — ABNORMAL HIGH (ref 1.5–6.5)
NEUT%: 79.9 % — AB (ref 38.4–76.8)
Platelets: 148 10*3/uL (ref 145–400)
RBC: 4.3 10*6/uL (ref 3.70–5.45)
RDW: 23.9 % — ABNORMAL HIGH (ref 11.2–14.5)
WBC: 13.7 10*3/uL — ABNORMAL HIGH (ref 3.9–10.3)
lymph#: 2.2 10*3/uL (ref 0.9–3.3)

## 2016-05-21 LAB — COMPREHENSIVE METABOLIC PANEL
ALBUMIN: 3.7 g/dL (ref 3.5–5.0)
ALK PHOS: 112 U/L (ref 40–150)
ALT: 18 U/L (ref 0–55)
AST: 21 U/L (ref 5–34)
Anion Gap: 7 mEq/L (ref 3–11)
BUN: 17.1 mg/dL (ref 7.0–26.0)
CO2: 28 mEq/L (ref 22–29)
Calcium: 9.1 mg/dL (ref 8.4–10.4)
Chloride: 102 mEq/L (ref 98–109)
Creatinine: 0.8 mg/dL (ref 0.6–1.1)
GLUCOSE: 95 mg/dL (ref 70–140)
POTASSIUM: 4.8 meq/L (ref 3.5–5.1)
SODIUM: 137 meq/L (ref 136–145)
Total Bilirubin: 0.57 mg/dL (ref 0.20–1.20)
Total Protein: 6.9 g/dL (ref 6.4–8.3)

## 2016-05-21 LAB — MAGNESIUM: Magnesium: 1.9 mg/dl (ref 1.5–2.5)

## 2016-05-21 MED ORDER — ONDANSETRON HCL 4 MG/2ML IJ SOLN
INTRAMUSCULAR | Status: AC
  Filled 2016-05-21: qty 4

## 2016-05-21 MED ORDER — PROCHLORPERAZINE MALEATE 10 MG PO TABS: 10.0000 mg | ORAL_TABLET | Freq: Four times a day (QID) | ORAL | 1 refills | Status: DC | PRN

## 2016-05-21 MED ORDER — HEPARIN SOD (PORK) LOCK FLUSH 100 UNIT/ML IV SOLN
500.0000 [IU] | Freq: Once | INTRAVENOUS | Status: AC
  Administered 2016-05-21: 500 [IU] via INTRAVENOUS
  Filled 2016-05-21: qty 5

## 2016-05-21 MED ORDER — SODIUM CHLORIDE 0.9 % IV SOLN: Freq: Once | INTRAVENOUS | Status: DC

## 2016-05-21 MED ORDER — LACTULOSE 10 GM/15ML PO SOLN: 10.0000 g | Freq: Three times a day (TID) | ORAL | 0 refills | Status: DC

## 2016-05-21 MED ORDER — ONDANSETRON HCL 4 MG/2ML IJ SOLN
8.0000 mg | Freq: Once | INTRAMUSCULAR | Status: AC
  Administered 2016-05-21: 8 mg via INTRAVENOUS

## 2016-05-21 MED ORDER — SODIUM CHLORIDE 0.9% FLUSH
10.0000 mL | INTRAVENOUS | Status: DC | PRN
  Administered 2016-05-21: 10 mL via INTRAVENOUS
  Filled 2016-05-21: qty 10

## 2016-05-21 MED ORDER — SODIUM CHLORIDE 0.9 % IV SOLN
Freq: Once | INTRAVENOUS | Status: AC
  Administered 2016-05-21: 13:00:00 via INTRAVENOUS

## 2016-05-21 NOTE — Telephone Encounter (Signed)
Patient called today complaining of nausea and vomiting and lower abd pain; states onset yesterday. States little PO intake. Patient states some constipation and denies diarrhea. States she is 2 weeks late for her menstrual cycle. Schedule patient to see Charlestine Massed NP today with IV fluids after if needed. Patient aware of appointment.

## 2016-05-21 NOTE — Patient Instructions (Signed)
Nausea, Adult Nausea is the feeling of an upset stomach or having to vomit. Nausea on its own is not usually a serious concern, but it may be an early sign of a more serious medical problem. As nausea gets worse, it can lead to vomiting. If vomiting develops, or if you are not able to drink enough fluids, you are at risk of becoming dehydrated. Dehydration can make you tired and thirsty, cause you to have a dry mouth, and decrease how often you urinate. Older adults and people with other diseases or a weak immune system are at higher risk for dehydration. The main goals of treating your nausea are:  To limit repeated nausea episodes.  To prevent vomiting and dehydration. Follow these instructions at home: Follow instructions from your health care provider about how to care for yourself at home. Eating and drinking  Follow these recommendations as told by your health care provider:  Take an oral rehydration solution (ORS). This is a drink that is sold at pharmacies and retail stores.  Drink clear fluids in small amounts as you are able. Clear fluids include water, ice chips, diluted fruit juice, and low-calorie sports drinks.  Eat bland, easy-to-digest foods in small amounts as you are able. These foods include bananas, applesauce, rice, lean meats, toast, and crackers.  Avoid drinking fluids that contain a lot of sugar or caffeine, such as energy drinks, sports drinks, and soda.  Avoid alcohol.  Avoid spicy or fatty foods. General instructions   Drink enough fluid to keep your urine clear or pale yellow.  Wash your hands often. If soap and water are not available, use hand sanitizer.  Make sure that all people in your household wash their hands well and often.  Rest at home while you recover.  Take over-the-counter and prescription medicines only as told by your health care provider.  Breathe slowly and deeply when you feel nauseous.  Watch your condition for any changes.  Keep  all follow-up visits as told by your health care provider. This is important. Contact a health care provider if:  You have a headache.  You have new symptoms.  Your nausea gets worse.  You have a fever.  You feel light-headed or dizzy.  You vomit.  You cannot keep fluids down. Get help right away if:  You have pain in your chest, neck, arm, or jaw.  You feel extremely weak or you faint.  You have vomit that is bright red or looks like coffee grounds.  You have bloody or black stools or stools that look like tar.  You have a severe headache, a stiff neck, or both.  You have severe pain, cramping, or bloating in your abdomen.  You have a rash.  You have difficulty breathing or are breathing very quickly.  Your heart is beating very quickly.  Your skin feels cold and clammy.  You feel confused.  You have pain when you urinate.  You have signs of dehydration, such as:  Dark urine, very little, or no urine.  Cracked lips.  Dry mouth.  Sunken eyes.  Sleepiness.  Weakness. These symptoms may represent a serious problem that is an emergency. Do not wait to see if the symptoms will go away. Get medical help right away. Call your local emergency services (911 in the [place] Do not drive yourself to the hospital. This information is not intended to replace advice given to you by your health care provider. Make sure you discuss any questions you have   with your health care provider. Document Released: 04/11/2004 Document Revised: 08/07/2015 Document Reviewed: 11/08/2014 Elsevier Interactive Patient Education  2017 [employer]    Dehydration, Adult Dehydration is when there is not enough fluid or water in your body. This happens when you lose more fluids than you take in. Dehydration can range from mild to very bad. It should be treated right away to keep it from getting very bad. Symptoms of mild dehydration may include:   Thirst.  Dry lips.  Slightly dry  mouth.  Dry, warm skin.  Dizziness. Symptoms of moderate dehydration may include:   Very dry mouth.  Muscle cramps.  Dark pee (urine). Pee may be the color of tea.  Your body making less pee.  Your eyes making fewer tears.  Heartbeat that is uneven or faster than normal (palpitations).  Headache.  Light-headedness, especially when you stand up from sitting.  Fainting (syncope). Symptoms of very bad dehydration may include:   Changes in skin, such as:  Cold and clammy skin.  Blotchy (mottled) or pale skin.  Skin that does not quickly return to normal after being lightly pinched and let go (poor skin turgor).  Changes in body fluids, such as:  Feeling very thirsty.  Your eyes making fewer tears.  Not sweating when body temperature is high, such as in hot weather.  Your body making very little pee.  Changes in vital signs, such as:  Weak pulse.  Pulse that is more than 100 beats a minute when you are sitting still.  Fast breathing.  Low blood pressure.  Other changes, such as:  Sunken eyes.  Cold hands and feet.  Confusion.  Lack of energy (lethargy).  Trouble waking up from sleep.  Short-term weight loss.  Unconsciousness. Follow these instructions at home:  If told by your doctor, drink an ORS:  Make an ORS by using instructions on the package.  Start by drinking small amounts, about  cup (120 mL) every 5-10 minutes.  Slowly drink more until you have had the amount that your doctor said to have.  Drink enough clear fluid to keep your pee clear or pale yellow. If you were told to drink an ORS, finish the ORS first, then start slowly drinking clear fluids. Drink fluids such as:  Water. Do not drink only water by itself. Doing that can make the salt (sodium) level in your body get too low (hyponatremia).  Ice chips.  Fruit juice that you have added water to (diluted).  Low-calorie sports drinks.  Avoid:  Alcohol.  Drinks that  have a lot of sugar. These include high-calorie sports drinks, fruit juice that does not have water added, and soda.  Caffeine.  Foods that are greasy or have a lot of fat or sugar.  Take over-the-counter and prescription medicines only as told by your doctor.  Do not take salt tablets. Doing that can make the salt level in your body get too high (hypernatremia).  Eat foods that have minerals (electrolytes). Examples include bananas, oranges, potatoes, tomatoes, and spinach.  Keep all follow-up visits as told by your doctor. This is important. Contact a doctor if:  You have belly (abdominal) pain that:  Gets worse.  Stays in one area (localizes).  You have a rash.  You have a stiff neck.  You get angry or annoyed more easily than normal (irritability).  You are more sleepy than normal.  You have a harder time waking up than normal.  You feel:  Weak.  Dizzy.  Very thirsty.  You have peed (urinated) only a small amount of very dark pee during 6-8 hours. Get help right away if:  You have symptoms of very bad dehydration.  You cannot drink fluids without throwing up (vomiting).  Your symptoms get worse with treatment.  You have a fever.  You have a very bad headache.  You are throwing up or having watery poop (diarrhea) and it:  Gets worse.  Does not go away.  You have blood or something green (bile) in your throw-up.  You have blood in your poop (stool). This may cause poop to look black and tarry.  You have not peed in 6-8 hours.  You pass out (faint).  Your heart rate when you are sitting still is more than 100 beats a minute.  You have trouble breathing. This information is not intended to replace advice given to you by your health care provider. Make sure you discuss any questions you have with your health care provider. Document Released: 12/29/2008 Document Revised: 09/22/2015 Document Reviewed: 04/28/2015 Elsevier Interactive Patient  Education  2017 [employer].

## 2016-05-21 NOTE — Progress Notes (Signed)
Patient Care Team: Jearld Fenton, NP as PCP - General (Internal Medicine) Servando Salina, MD as Consulting Physician (Obstetrics and Gynecology) Nicholas Lose, MD as Consulting Physician (Hematology and Oncology) Minette Headland, NP as Nurse Practitioner (Hematology and Oncology)  DIAGNOSIS:  Encounter Diagnoses  Name Primary?  . Malignant neoplasm of upper-outer quadrant of left breast in female, estrogen receptor negative ([place]) Yes  . Dehydration   . Drug-induced constipation     SUMMARY OF ONCOLOGIC HISTORY:   Malignant neoplasm of upper-outer quadrant of left breast in female, estrogen receptor negative ([place])   12/19/2015 Initial Diagnosis    Left breast biopsy: IDC with DCI S, grade 3, ER 0%, PR 0%, KIC system 40%, her 2 positive ratio 6.15, copy number 16.5; palpable lump left breast 1.6 cm irregular oval mass, T1cN0 stage I a clinical stage      01/17/2016 Surgery    Left Lumpectomy: IDC 4.1 cm with DCIS, Margin 0.1 cm, 1/3 LN Positive, ER/PR Neg, Her 2 Pos with Ki 67 of 40%, T2N1 (stage 2B)      02/22/2016 -  Chemotherapy    Adjuvant chemotherapy with TCH Perjeta 6 cycles followed by Herceptin Perjeta maintenance        CHIEF COMPLIANT: Cycle 5 TCHP  INTERVAL HISTORY: Brianna James is a 36 year old with above-mentioned history left breast cancer treated with lumpectomy and is currently on adjuvant chemotherapy with TCH Perjeta. Today is cycle 5 day 6 of treatment.  She has been feeling poorly the past two days.  This started with nauesa yesterday, and she has vomited twice today.  She has felt more constipated and bloated with this cycle, and her last bowel movement was yesterday, but it was very small.  She has not taken loperamide or lomotil with this cycle of treatment, the last time she took it was 2 weeks ago.  Her menstrual cycle is 2 weeks late.  She has not had intercourse since November, 2017.  She is experiencing abdominal pain and cramping,  intermittent and diffuse 10/10 at its worst.    REVIEW OF SYSTEMS:   Leonidas Romberg denies fevers, chills, sick contacts, hematemesis, melena, hematochezia, or any other concerns today other than whats noted above.  All other systems were reviewed with the patient and are negative.  I have reviewed the past medical history, past surgical history, social history and family history with the patient and they are unchanged from previous note.  ALLERGIES:  is allergic to other; oxycodone; and peanuts [peanut oil].  MEDICATIONS:  Current Outpatient Prescriptions  Medication Sig Dispense Refill  . dexamethasone (DECADRON) 4 MG tablet Take 1 tablet (4 mg total) by mouth daily. Take 1 tablet daily before and 1 tablet day after chemotherapy 30 tablet 1  . lidocaine-prilocaine (EMLA) cream Apply to affected area once 30 g 3  . loratadine (CLARITIN) 10 MG tablet Take 10 mg by mouth daily.    Marland Kitchen LORazepam (ATIVAN) 0.5 MG tablet Take 1 tablet (0.5 mg total) by mouth at bedtime. As needed for sleep 30 tablet 0  . ondansetron (ZOFRAN) 8 MG tablet Take 1 tablet (8 mg total) by mouth 2 (two) times daily as needed for refractory nausea / vomiting. Start on day 3 after chemo. 30 tablet 1  . lactulose (CHRONULAC) 10 GM/15ML solution Take 15 mLs (10 g total) by mouth 3 (three) times daily. 240 mL 0  . prochlorperazine (COMPAZINE) 10 MG tablet Take 1 tablet (10 mg total) by mouth every 6 (six) hours  as needed (Nausea or vomiting). 30 tablet 1   No current facility-administered medications for this visit.    Facility-Administered Medications Ordered in Other Visits  Medication Dose Route Frequency Provider Last Rate Last Dose  . sodium chloride flush (NS) 0.9 % injection 10 mL  10 mL Intracatheter PRN Nicholas Lose, MD   10 mL at 05/16/16 1540    PHYSICAL EXAMINATION: ECOG PERFORMANCE STATUS: 1 - Symptomatic but completely ambulatory  Vitals:   05/21/16 1109  BP: (!) 96/54  Pulse: 89  Resp: 18  Temp: 98.4 F  (36.9 C)   Filed Weights   05/21/16 1109  Weight: 230 lb 11.2 oz (104.6 kg)  GENERAL: Patient is a well appearing female who clearly doesn't feel well today HEENT:  Sclerae anicteric.  Oropharynx clear and moist. No ulcerations or evidence of oropharyngeal candidiasis. Neck is supple.  NODES:  No cervical, supraclavicular, infraclavicular, or axillary lymphadenopathy palpated.  BREAST EXAM:  Deferred. LUNGS:  Clear to auscultation bilaterally.  No wheezes or rhonchi. HEART:  Regular rate and rhythm. No murmur appreciated. ABDOMEN:  Soft, nontender, slightly distended.  Positive, hypoactive bowel sounds. No organomegaly palpated. MSK:  No focal spinal tenderness to palpation.  EXTREMITIES:  No peripheral edema.   SKIN:  Clear with no obvious rashes or skin changes. No nail dyscrasia. NEURO:  Nonfocal. Well oriented.  Appropriate affect.    LABORATORY DATA:  I have reviewed the data as listed   Chemistry      Component Value Date/Time   NA 137 05/21/2016 1049   K 4.8 05/21/2016 1049   CL 98 (L) 02/29/2016 0148   CO2 28 05/21/2016 1049   BUN 17.1 05/21/2016 1049   CREATININE 0.8 05/21/2016 1049      Component Value Date/Time   CALCIUM 9.1 05/21/2016 1049   ALKPHOS 112 05/21/2016 1049   AST 21 05/21/2016 1049   ALT 18 05/21/2016 1049   BILITOT 0.57 05/21/2016 1049       Lab Results  Component Value Date   WBC 13.7 (H) 05/21/2016   HGB 10.1 (L) 05/21/2016   HCT 32.3 (L) 05/21/2016   MCV 75.0 (L) 05/21/2016   PLT 148 05/21/2016   NEUTROABS 11.0 (H) 05/21/2016    ASSESSMENT & PLAN:  Malignant neoplasm of upper-outer quadrant of left breast in female, estrogen receptor negative ([place]) 01/17/16: Left Lumpectomy: 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 Adjuvant chemotherapy with TCHPX 6 cycles followed by Herceptin-Perjetamaintenance for one year 2. Followed by adjuvant  radiation ---------------------------------------------------------------------------------------------------------------------------------------------------- Current treatment: Cycle 5 day 6 TCHP Chemotherapy toxicities: Nausea, vomiting, abdominal pain: abdominal film, flat and upright which demonstrated constipation.  Her labs were normal today.  She will receive 1liter of IV fluids today and also IV Zofran.  I sent in Lactulose for her to take TID until she has a good BM.  She will f/u in 3 days with me for evaluation.     I spent 25 minutes talking to the patient of which more than half was spent in counseling and coordination of care.   The patient has a good understanding of the overall plan. she agrees with it. she will call with any problems that may develop before the next visit here.   Charlestine Massed, NP 05/21/16

## 2016-05-24 ENCOUNTER — Ambulatory Visit (HOSPITAL_BASED_OUTPATIENT_CLINIC_OR_DEPARTMENT_OTHER): Payer: 59 | Admitting: Adult Health

## 2016-05-24 ENCOUNTER — Encounter: Payer: Self-pay | Admitting: Adult Health

## 2016-05-24 VITALS — BP 112/64 | HR 95 | Temp 97.8°F | Resp 18 | Ht 67.0 in | Wt 226.4 lb

## 2016-05-24 DIAGNOSIS — C50412 Malignant neoplasm of upper-outer quadrant of left female breast: Secondary | ICD-10-CM | POA: Diagnosis not present

## 2016-05-24 DIAGNOSIS — C773 Secondary and unspecified malignant neoplasm of axilla and upper limb lymph nodes: Secondary | ICD-10-CM | POA: Diagnosis not present

## 2016-05-24 DIAGNOSIS — Z171 Estrogen receptor negative status [ER-]: Secondary | ICD-10-CM | POA: Diagnosis not present

## 2016-05-24 NOTE — Progress Notes (Signed)
Patient Care Team: Jearld Fenton, NP as PCP - General (Internal Medicine) Servando Salina, MD as Consulting Physician (Obstetrics and Gynecology) Nicholas Lose, MD as Consulting Physician (Hematology and Oncology) Gardenia Phlegm, NP as Nurse Practitioner (Hematology and Oncology)  DIAGNOSIS:  No diagnosis found.  SUMMARY OF ONCOLOGIC HISTORY:   Malignant neoplasm of upper-outer quadrant of left breast in female, estrogen receptor negative ([place])   12/19/2015 Initial Diagnosis    Left breast biopsy: IDC with DCI S, grade 3, ER 0%, PR 0%, KIC system 40%, her 2 positive ratio 6.15, copy number 16.5; palpable lump left breast 1.6 cm irregular oval mass, T1cN0 stage I a clinical stage      01/17/2016 Surgery    Left Lumpectomy: IDC 4.1 cm with DCIS, Margin 0.1 cm, 1/3 LN Positive, ER/PR Neg, Her 2 Pos with Ki 67 of 40%, T2N1 (stage 2B)      02/22/2016 -  Chemotherapy    Adjuvant chemotherapy with TCH Perjeta 6 cycles followed by Herceptin Perjeta maintenance        CHIEF COMPLIANT: Cycle 5 TCHP  INTERVAL HISTORY: Brianna James is a 36 year old with above-mentioned history left breast cancer treated with lumpectomy and is currently on adjuvant chemotherapy with TCH Perjeta. Today is cycle 5 day 9 after her treatment.  She was seen earlier this week with nausea, vomiting, weakness, and severe constipation.  She underwent abdominal xray that demonstrated constipation.  She took three doses of Lactulose and had a very large BM and feels much improved.  She is going to take one dose daily x 2 days and then she think she will be back to normal.  She is drinking more water and does admit she could do better, but otherwise is well today and without questions or concerns.   REVIEW OF SYSTEMS:   Brianna James denies fevers, chills, sick contacts, hematemesis, melena, hematochezia, or any other concerns today other than whats noted above.  All other systems were reviewed with the  patient and are negative.  I have reviewed the past medical history, past surgical history, social history and family history with the patient and they are unchanged from previous note.  ALLERGIES:  is allergic to other; oxycodone; and peanuts [peanut oil].  MEDICATIONS:  Current Outpatient Prescriptions  Medication Sig Dispense Refill  . dexamethasone (DECADRON) 4 MG tablet Take 1 tablet (4 mg total) by mouth daily. Take 1 tablet daily before and 1 tablet day after chemotherapy 30 tablet 1  . lactulose (CHRONULAC) 10 GM/15ML solution Take 15 mLs (10 g total) by mouth 3 (three) times daily. 240 mL 0  . lidocaine-prilocaine (EMLA) cream Apply to affected area once 30 g 3  . loratadine (CLARITIN) 10 MG tablet Take 10 mg by mouth daily.    Marland Kitchen LORazepam (ATIVAN) 0.5 MG tablet Take 1 tablet (0.5 mg total) by mouth at bedtime. As needed for sleep 30 tablet 0  . magnesium citrate SOLN Take 1 Bottle by mouth once.    . ondansetron (ZOFRAN) 8 MG tablet Take 1 tablet (8 mg total) by mouth 2 (two) times daily as needed for refractory nausea / vomiting. Start on day 3 after chemo. 30 tablet 1  . prochlorperazine (COMPAZINE) 10 MG tablet Take 1 tablet (10 mg total) by mouth every 6 (six) hours as needed (Nausea or vomiting). 30 tablet 1   No current facility-administered medications for this visit.    Facility-Administered Medications Ordered in Other Visits  Medication Dose Route Frequency Provider Last  Rate Last Dose  . sodium chloride flush (NS) 0.9 % injection 10 mL  10 mL Intracatheter PRN Nicholas Lose, MD   10 mL at 05/16/16 1540    PHYSICAL EXAMINATION: ECOG PERFORMANCE STATUS: 1 - Symptomatic but completely ambulatory  Vitals:   05/24/16 0909  BP: 112/64  Pulse: 95  Resp: 18  Temp: 97.8 F (36.6 C)   Filed Weights   05/24/16 0909  Weight: 226 lb 6.4 oz (102.7 kg)  GENERAL: Patient is a well appearing female in no acute distress HEENT:  Sclerae anicteric. PERRL  Oropharynx clear and  moist. No ulcerations or evidence of oropharyngeal candidiasis. Neck is supple.  NODES:  No cervical, supraclavicular, infraclavicular, or axillary lymphadenopathy palpated.  BREAST EXAM:  Deferred. LUNGS:  Clear to auscultation bilaterally.  No wheezes or rhonchi. HEART:  Regular rate and rhythm. No murmur appreciated. ABDOMEN:  Soft, nontender, slightly distended.  Positive, hypoactive bowel sounds. No organomegaly palpated. MSK:  No focal spinal tenderness to palpation.  EXTREMITIES:  No peripheral edema.   SKIN:  Clear with no obvious rashes or skin changes. No nail dyscrasia. NEURO:  Nonfocal. Well oriented.  Appropriate affect.    LABORATORY DATA:  I have reviewed the data as listed   Chemistry      Component Value Date/Time   NA 137 05/21/2016 1049   K 4.8 05/21/2016 1049   CL 98 (L) 02/29/2016 0148   CO2 28 05/21/2016 1049   BUN 17.1 05/21/2016 1049   CREATININE 0.8 05/21/2016 1049      Component Value Date/Time   CALCIUM 9.1 05/21/2016 1049   ALKPHOS 112 05/21/2016 1049   AST 21 05/21/2016 1049   ALT 18 05/21/2016 1049   BILITOT 0.57 05/21/2016 1049       Lab Results  Component Value Date   WBC 13.7 (H) 05/21/2016   HGB 10.1 (L) 05/21/2016   HCT 32.3 (L) 05/21/2016   MCV 75.0 (L) 05/21/2016   PLT 148 05/21/2016   NEUTROABS 11.0 (H) 05/21/2016    ASSESSMENT & PLAN:  Malignant neoplasm of upper-outer quadrant of left breast in female, estrogen receptor negative ([place]) 01/17/16: Left Lumpectomy: 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 Adjuvant chemotherapy with TCHPX 6 cycles followed by Herceptin-Perjetamaintenance for one year 2. Followed by adjuvant radiation ---------------------------------------------------------------------------------------------------------------------------------------------------- Current treatment: Cycle 5 day 9 TCHP Chemotherapy  toxicities: Brianna James is doing well today.  She willt ake two more days of lactulose daily and then will stop.  She is no longer constipated or dehydrated.  She will return on 3/22 for lab, evaluation with me and cycle 6 of her chemotherapy.  She has echo and Dr. Haroldine Laws appointment on April 5, and eval with Dr. Isidore Moos regarding her adjuvant radiation on April 9.  She will start on Herceptin Perjeta alone on 4/12.  Her next appointment with Dr. Lindi Adie will be on May 3.  I reviewed this with the patient and she verbalized understanding of the plan.      I spent 25 minutes talking to the patient of which more than half was spent in counseling and coordination of care.   The patient has a good understanding of the overall plan. she agrees with it. she will call with any problems that may develop before the next visit here.   Scot Dock, NP 05/24/16

## 2016-05-28 ENCOUNTER — Encounter: Payer: Self-pay | Admitting: *Deleted

## 2016-05-30 ENCOUNTER — Encounter: Payer: Self-pay | Admitting: *Deleted

## 2016-06-06 ENCOUNTER — Other Ambulatory Visit (HOSPITAL_BASED_OUTPATIENT_CLINIC_OR_DEPARTMENT_OTHER): Payer: 59

## 2016-06-06 ENCOUNTER — Encounter: Payer: Self-pay | Admitting: *Deleted

## 2016-06-06 ENCOUNTER — Encounter: Payer: Self-pay | Admitting: Adult Health

## 2016-06-06 ENCOUNTER — Ambulatory Visit (HOSPITAL_BASED_OUTPATIENT_CLINIC_OR_DEPARTMENT_OTHER): Payer: 59

## 2016-06-06 ENCOUNTER — Ambulatory Visit (HOSPITAL_BASED_OUTPATIENT_CLINIC_OR_DEPARTMENT_OTHER): Payer: 59 | Admitting: Adult Health

## 2016-06-06 VITALS — BP 122/76 | HR 94 | Temp 98.1°F | Resp 18 | Ht 67.0 in | Wt 229.1 lb

## 2016-06-06 DIAGNOSIS — Z171 Estrogen receptor negative status [ER-]: Principal | ICD-10-CM

## 2016-06-06 DIAGNOSIS — C50412 Malignant neoplasm of upper-outer quadrant of left female breast: Secondary | ICD-10-CM

## 2016-06-06 DIAGNOSIS — C773 Secondary and unspecified malignant neoplasm of axilla and upper limb lymph nodes: Secondary | ICD-10-CM

## 2016-06-06 DIAGNOSIS — Z5112 Encounter for antineoplastic immunotherapy: Secondary | ICD-10-CM

## 2016-06-06 LAB — COMPREHENSIVE METABOLIC PANEL
ALT: 25 U/L (ref 0–55)
AST: 29 U/L (ref 5–34)
Albumin: 3.9 g/dL (ref 3.5–5.0)
Alkaline Phosphatase: 95 U/L (ref 40–150)
Anion Gap: 9 mEq/L (ref 3–11)
BUN: 8.3 mg/dL (ref 7.0–26.0)
CHLORIDE: 105 meq/L (ref 98–109)
CO2: 23 meq/L (ref 22–29)
CREATININE: 0.8 mg/dL (ref 0.6–1.1)
Calcium: 9.6 mg/dL (ref 8.4–10.4)
EGFR: >90 mL/min/{1.73_m2} (ref >90)
GLUCOSE: 111 mg/dL (ref 70–140)
POTASSIUM: 4 meq/L (ref 3.5–5.1)
SODIUM: 137 meq/L (ref 136–145)
Total Bilirubin: 0.26 mg/dL (ref 0.20–1.20)
Total Protein: 7.4 g/dL (ref 6.4–8.3)

## 2016-06-06 LAB — CBC WITH DIFFERENTIAL/PLATELET
BASO%: 0.1 % (ref 0.0–2.0)
BASOS ABS: 0 10*3/uL (ref 0.0–0.1)
EOS%: 0 % (ref 0.0–7.0)
Eosinophils Absolute: 0 10*3/uL (ref 0.0–0.5)
HCT: 31 % — ABNORMAL LOW (ref 34.8–46.6)
HGB: 10.1 g/dL — ABNORMAL LOW (ref 11.6–15.9)
LYMPH%: 19.5 % (ref 14.0–49.7)
MCH: 24.4 pg — AB (ref 25.1–34.0)
MCHC: 32.5 g/dL (ref 31.5–36.0)
MCV: 75 fL — ABNORMAL LOW (ref 79.5–101.0)
MONO#: 0.3 10*3/uL (ref 0.1–0.9)
MONO%: 4 % (ref 0.0–14.0)
NEUT#: 6.2 10*3/uL (ref 1.5–6.5)
NEUT%: 76.4 % (ref 38.4–76.8)
Platelets: 329 10*3/uL (ref 145–400)
RBC: 4.13 10*6/uL (ref 3.70–5.45)
RDW: 24.1 % — ABNORMAL HIGH (ref 11.2–14.5)
WBC: 8.2 10*3/uL (ref 3.9–10.3)
lymph#: 1.6 10*3/uL (ref 0.9–3.3)

## 2016-06-06 MED ORDER — TRASTUZUMAB CHEMO 150 MG IV SOLR
6.0000 mg/kg | Freq: Once | INTRAVENOUS | Status: AC
  Administered 2016-06-06: 588 mg via INTRAVENOUS
  Filled 2016-06-06: qty 28

## 2016-06-06 MED ORDER — PEGFILGRASTIM 6 MG/0.6ML ~~LOC~~ PSKT
6.0000 mg | PREFILLED_SYRINGE | Freq: Once | SUBCUTANEOUS | Status: AC
  Administered 2016-06-06: 6 mg via SUBCUTANEOUS
  Filled 2016-06-06: qty 0.6

## 2016-06-06 MED ORDER — SODIUM CHLORIDE 0.9 % IV SOLN
Freq: Once | INTRAVENOUS | Status: AC
  Administered 2016-06-06: 11:00:00 via INTRAVENOUS
  Filled 2016-06-06: qty 5

## 2016-06-06 MED ORDER — DOCETAXEL CHEMO INJECTION 160 MG/16ML
65.0000 mg/m2 | Freq: Once | INTRAVENOUS | Status: AC
  Administered 2016-06-06: 140 mg via INTRAVENOUS
  Filled 2016-06-06: qty 14

## 2016-06-06 MED ORDER — SODIUM CHLORIDE 0.9 % IV SOLN
420.0000 mg | Freq: Once | INTRAVENOUS | Status: AC
  Administered 2016-06-06: 420 mg via INTRAVENOUS
  Filled 2016-06-06: qty 14

## 2016-06-06 MED ORDER — SODIUM CHLORIDE 0.9% FLUSH
10.0000 mL | INTRAVENOUS | Status: DC | PRN
  Administered 2016-06-06: 10 mL
  Filled 2016-06-06: qty 10

## 2016-06-06 MED ORDER — DEXAMETHASONE SODIUM PHOSPHATE 10 MG/ML IJ SOLN: 10.0000 mg | Freq: Once | INTRAMUSCULAR | Status: DC

## 2016-06-06 MED ORDER — SODIUM CHLORIDE 0.9 % IV SOLN
700.0000 mg | Freq: Once | INTRAVENOUS | Status: AC
  Administered 2016-06-06: 700 mg via INTRAVENOUS
  Filled 2016-06-06: qty 70

## 2016-06-06 MED ORDER — DIPHENHYDRAMINE HCL 25 MG PO CAPS
50.0000 mg | ORAL_CAPSULE | Freq: Once | ORAL | Status: AC
  Administered 2016-06-06: 50 mg via ORAL

## 2016-06-06 MED ORDER — PALONOSETRON HCL INJECTION 0.25 MG/5ML
0.2500 mg | Freq: Once | INTRAVENOUS | Status: AC
  Administered 2016-06-06: 0.25 mg via INTRAVENOUS

## 2016-06-06 MED ORDER — DEXAMETHASONE SODIUM PHOSPHATE 10 MG/ML IJ SOLN
INTRAMUSCULAR | Status: AC
  Filled 2016-06-06: qty 1

## 2016-06-06 MED ORDER — SODIUM CHLORIDE 0.9 % IV SOLN
Freq: Once | INTRAVENOUS | Status: AC
  Administered 2016-06-06: 11:00:00 via INTRAVENOUS

## 2016-06-06 MED ORDER — DIPHENHYDRAMINE HCL 25 MG PO CAPS
ORAL_CAPSULE | ORAL | Status: AC
  Filled 2016-06-06: qty 2

## 2016-06-06 MED ORDER — SODIUM CHLORIDE 0.9 % IV SOLN
Freq: Once | INTRAVENOUS | Status: DC
  Filled 2016-06-06: qty 5

## 2016-06-06 MED ORDER — ACETAMINOPHEN 325 MG PO TABS
ORAL_TABLET | ORAL | Status: AC
  Filled 2016-06-06: qty 2

## 2016-06-06 MED ORDER — ACETAMINOPHEN 325 MG PO TABS
650.0000 mg | ORAL_TABLET | Freq: Once | ORAL | Status: AC
  Administered 2016-06-06: 650 mg via ORAL

## 2016-06-06 MED ORDER — PALONOSETRON HCL INJECTION 0.25 MG/5ML
INTRAVENOUS | Status: AC
  Filled 2016-06-06: qty 5

## 2016-06-06 MED ORDER — HEPARIN SOD (PORK) LOCK FLUSH 100 UNIT/ML IV SOLN
500.0000 [IU] | Freq: Once | INTRAVENOUS | Status: AC | PRN
  Administered 2016-06-06: 500 [IU]
  Filled 2016-06-06: qty 5

## 2016-06-06 NOTE — Progress Notes (Signed)
Patient Care Team: Jearld Fenton, NP as PCP - General (Internal Medicine) Servando Salina, MD as Consulting Physician (Obstetrics and Gynecology) Nicholas Lose, MD as Consulting Physician (Hematology and Oncology) Gardenia Phlegm, NP as Nurse Practitioner (Hematology and Oncology)  DIAGNOSIS:  No diagnosis found.  SUMMARY OF ONCOLOGIC HISTORY:   Malignant neoplasm of upper-outer quadrant of left breast in female, estrogen receptor negative ([place])   12/19/2015 Initial Diagnosis    Left breast biopsy: IDC with DCI S, grade 3, ER 0%, PR 0%, KIC system 40%, her 2 positive ratio 6.15, copy number 16.5; palpable lump left breast 1.6 cm irregular oval mass, T1cN0 stage I a clinical stage      01/17/2016 Surgery    Left Lumpectomy: IDC 4.1 cm with DCIS, Margin 0.1 cm, 1/3 LN Positive, ER/PR Neg, Her 2 Pos with Ki 67 of 40%, T2N1 (stage 2B)      02/22/2016 -  Chemotherapy    Adjuvant chemotherapy with TCH Perjeta 6 cycles followed by Herceptin Perjeta maintenance        CHIEF COMPLIANT: Cycle 5 TCHP  INTERVAL HISTORY: Brianna James is a 36 year old with above-mentioned history left breast cancer treated with lumpectomy and is currently on adjuvant chemotherapy with TCH Perjeta. Today is cycle 6 day 1 of TCH Perjeta.  She is very happy that this is her last cycle.  She denies any peripheral neuropathy, nausea, vomiting, or diarrhea.  She does have some left breast tenderness that has been present the past couple of days, that she wants me to look at.  It is not warm, tender, and there is no drainage.    REVIEW OF SYSTEMS:   Leonidas Romberg denies fevers, chills, nausea, vomiting, diarrhea, or any other conerns.    All other systems were reviewed with the patient and are negative.  I have reviewed the past medical history, past surgical history, social history and family history with the patient and they are unchanged from previous note.  ALLERGIES:  is allergic to other;  oxycodone; and peanuts [peanut oil].  MEDICATIONS:  Current Outpatient Prescriptions  Medication Sig Dispense Refill  . dexamethasone (DECADRON) 4 MG tablet Take 1 tablet (4 mg total) by mouth daily. Take 1 tablet daily before and 1 tablet day after chemotherapy 30 tablet 1  . lactulose (CHRONULAC) 10 GM/15ML solution Take 15 mLs (10 g total) by mouth 3 (three) times daily. 240 mL 0  . lidocaine-prilocaine (EMLA) cream Apply to affected area once 30 g 3  . loratadine (CLARITIN) 10 MG tablet Take 10 mg by mouth daily.    Marland Kitchen LORazepam (ATIVAN) 0.5 MG tablet Take 1 tablet (0.5 mg total) by mouth at bedtime. As needed for sleep 30 tablet 0  . magnesium citrate SOLN Take 1 Bottle by mouth once.    . ondansetron (ZOFRAN) 8 MG tablet Take 1 tablet (8 mg total) by mouth 2 (two) times daily as needed for refractory nausea / vomiting. Start on day 3 after chemo. 30 tablet 1  . Prenatal Multivit-Min-Fe-FA (PRENATAL VITAMINS PO) Take 2 tablets by mouth.    . prochlorperazine (COMPAZINE) 10 MG tablet Take 1 tablet (10 mg total) by mouth every 6 (six) hours as needed (Nausea or vomiting). 30 tablet 1   No current facility-administered medications for this visit.    Facility-Administered Medications Ordered in Other Visits  Medication Dose Route Frequency Provider Last Rate Last Dose  . sodium chloride flush (NS) 0.9 % injection 10 mL  10 mL Intracatheter PRN  Nicholas Lose, MD   10 mL at 05/16/16 1540    PHYSICAL EXAMINATION: ECOG PERFORMANCE STATUS: 1 - Symptomatic but completely ambulatory  Vitals:   06/06/16 0951  BP: 122/76  Pulse: 94  Resp: 18  Temp: 98.1 F (36.7 C)   Filed Weights   06/06/16 0951  Weight: 229 lb 1.6 oz (103.9 kg)  GENERAL: Patient is a well appearing female in no acute distress HEENT:  Sclerae anicteric. PERRL  Oropharynx clear and moist. No ulcerations or evidence of oropharyngeal candidiasis. Neck is supple.  NODES:  No cervical, supraclavicular, infraclavicular, or  axillary lymphadenopathy palpated.  BREAST EXAM:  Left upper outer breast slightly tender to palpation, no erythema or warmth noted, no nodules, masses noted LUNGS:  Clear to auscultation bilaterally.  No wheezes or rhonchi. HEART:  Regular rate and rhythm. No murmur appreciated. ABDOMEN:  Soft, nontender, slightly distended.  Positive, hypoactive bowel sounds. No organomegaly palpated. MSK:  No focal spinal tenderness to palpation.  EXTREMITIES:  No peripheral edema.   SKIN:  Clear with no obvious rashes or skin changes. No nail dyscrasia. NEURO:  Nonfocal. Well oriented.  Appropriate affect.    LABORATORY DATA:  I have reviewed the data as listed   Chemistry      Component Value Date/Time   NA 137 05/21/2016 1049   K 4.8 05/21/2016 1049   CL 98 (L) 02/29/2016 0148   CO2 28 05/21/2016 1049   BUN 17.1 05/21/2016 1049   CREATININE 0.8 05/21/2016 1049      Component Value Date/Time   CALCIUM 9.1 05/21/2016 1049   ALKPHOS 112 05/21/2016 1049   AST 21 05/21/2016 1049   ALT 18 05/21/2016 1049   BILITOT 0.57 05/21/2016 1049       Lab Results  Component Value Date   WBC 8.2 06/06/2016   HGB 10.1 (L) 06/06/2016   HCT 31.0 (L) 06/06/2016   MCV 75.0 (L) 06/06/2016   PLT 329 06/06/2016   NEUTROABS 6.2 06/06/2016    ASSESSMENT & PLAN:  Malignant neoplasm of upper-outer quadrant of left breast in female, estrogen receptor negative ([place]) 01/17/16: Left Lumpectomy: 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 Adjuvant chemotherapy with TCHPX 6 cycles followed by Herceptin-Perjetamaintenance for one year 2. Followed by adjuvant radiation ---------------------------------------------------------------------------------------------------------------------------------------------------- Current treatment: Cycle 6 day 1 TCHP Chemotherapy toxicities: [place] is doing well today.  I reviewed her  labs which are stable today.  She will proceed with chemotherapy.  She has no sign of peripheral neuropathy.  She will return in one week for labs and follow up.     The patient has a good understanding of the overall plan. she agrees with it. she will call with any problems that may develop before the next visit here.  A total of (30) minutes of face-to-face time was spent with this patient with greater than 50% of that time in counseling and care-coordination.    Scot Dock, NP 06/06/16

## 2016-06-06 NOTE — Patient Instructions (Signed)
[facility] Discharge Instructions for Patients Receiving Chemotherapy  Today you received the following chemotherapy agents Herceptin, Perjeta, Taxotere and Carboplatin  To help prevent nausea and vomiting after your treatment, we encourage you to take your nausea medication as directed. No Zofran for 3 days. Take Compazine instead.    If you develop nausea and vomiting that is not controlled by your nausea medication, call the clinic.   BELOW ARE SYMPTOMS THAT SHOULD BE REPORTED IMMEDIATELY:  *FEVER GREATER THAN 100.5 F  *CHILLS WITH OR WITHOUT FEVER  NAUSEA AND VOMITING THAT IS NOT CONTROLLED WITH YOUR NAUSEA MEDICATION  *UNUSUAL SHORTNESS OF BREATH  *UNUSUAL BRUISING OR BLEEDING  TENDERNESS IN MOUTH AND THROAT WITH OR WITHOUT PRESENCE OF ULCERS  *URINARY PROBLEMS  *BOWEL PROBLEMS  UNUSUAL RASH Items with * indicate a potential emergency and should be followed up as soon as possible.  Feel free to call the clinic you have any questions or concerns. The clinic phone number is [phone].  Please show the [facility] at check-in to the Emergency Department and triage nurse.

## 2016-06-10 NOTE — Progress Notes (Signed)
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  Receptor Status: ER(NEG), PR (NEG), Her2-neu (POS), Ki-(40%)  Did patient present with symptoms or was this found on screening mammography?: She was playing with her son, when she hurt the breast and felt a lesion.   Past/Anticipated interventions by surgeon, if any: 01/16/17 Right subclavian Port placement, left Breast Lumpectomy with Sentinel Node Mapping and Biopsy Procedure Note ([facility]) Dr. Barry Dienes  01/24/17 PROCEDURE:  Procedure(s): Evacuation and washout left breast hematoma SURGEON:  Surgeon(s): [name], MD  Past/Anticipated interventions by medical oncology, if any:  Thedore Mins NP 06/06/16  Adjuvant chemotherapy with TCH X 6 cycles (completed 06/06/16)  followed by Herceptin maintenance for one year.  Followed by adjuvant radiation  Lymphedema issues, if any:  She denies. She has good arm mobility  Pain issues, if any: She denies pain.   SAFETY ISSUES:  Prior radiation? No  Pacemaker/ICD? No  Possible current pregnancy? No  Is the patient on methotrexate? No  Current Complaints / other details:    BP 118/78 (BP Location: Right Arm, Patient Position: Sitting, Cuff Size: Normal)   Pulse 87   Temp 98.4 F (36.9 C) (Oral)   Resp 18   Ht '5\' 7"'  (1.702 m)   Wt 229 lb 6.4 oz (104.1 kg)   BMI 35.93 kg/m

## 2016-06-13 ENCOUNTER — Other Ambulatory Visit (HOSPITAL_BASED_OUTPATIENT_CLINIC_OR_DEPARTMENT_OTHER): Payer: 59

## 2016-06-13 ENCOUNTER — Encounter: Payer: Self-pay | Admitting: Adult Health

## 2016-06-13 ENCOUNTER — Encounter: Payer: Self-pay | Admitting: Radiation Oncology

## 2016-06-13 ENCOUNTER — Ambulatory Visit (HOSPITAL_BASED_OUTPATIENT_CLINIC_OR_DEPARTMENT_OTHER): Payer: 59 | Admitting: Adult Health

## 2016-06-13 VITALS — BP 113/75 | HR 99 | Temp 98.6°F | Resp 18 | Ht 67.0 in | Wt 226.2 lb

## 2016-06-13 DIAGNOSIS — K5903 Drug induced constipation: Secondary | ICD-10-CM

## 2016-06-13 DIAGNOSIS — R530 Neoplastic (malignant) related fatigue: Secondary | ICD-10-CM

## 2016-06-13 DIAGNOSIS — R630 Anorexia: Secondary | ICD-10-CM

## 2016-06-13 DIAGNOSIS — R21 Rash and other nonspecific skin eruption: Secondary | ICD-10-CM

## 2016-06-13 DIAGNOSIS — C773 Secondary and unspecified malignant neoplasm of axilla and upper limb lymph nodes: Secondary | ICD-10-CM | POA: Diagnosis not present

## 2016-06-13 DIAGNOSIS — C50412 Malignant neoplasm of upper-outer quadrant of left female breast: Secondary | ICD-10-CM

## 2016-06-13 DIAGNOSIS — Z171 Estrogen receptor negative status [ER-]: Principal | ICD-10-CM

## 2016-06-13 DIAGNOSIS — R110 Nausea: Secondary | ICD-10-CM | POA: Diagnosis not present

## 2016-06-13 LAB — COMPREHENSIVE METABOLIC PANEL
ALK PHOS: 153 U/L — AB (ref 40–150)
ALT: 28 U/L (ref 0–55)
ANION GAP: 8 meq/L (ref 3–11)
AST: 26 U/L (ref 5–34)
Albumin: 3.9 g/dL (ref 3.5–5.0)
BILIRUBIN TOTAL: 0.34 mg/dL (ref 0.20–1.20)
BUN: 10.6 mg/dL (ref 7.0–26.0)
CALCIUM: 9.4 mg/dL (ref 8.4–10.4)
CO2: 28 mEq/L (ref 22–29)
CREATININE: 0.9 mg/dL (ref 0.6–1.1)
Chloride: 101 mEq/L (ref 98–109)
Glucose: 94 mg/dl (ref 70–140)
Potassium: 4.1 mEq/L (ref 3.5–5.1)
Sodium: 137 mEq/L (ref 136–145)
TOTAL PROTEIN: 7.2 g/dL (ref 6.4–8.3)

## 2016-06-13 LAB — CBC WITH DIFFERENTIAL/PLATELET
BASO%: 0.5 % (ref 0.0–2.0)
Basophils Absolute: 0.1 10*3/uL (ref 0.0–0.1)
EOS%: 0 % (ref 0.0–7.0)
Eosinophils Absolute: 0 10*3/uL (ref 0.0–0.5)
HEMATOCRIT: 30.9 % — AB (ref 34.8–46.6)
HGB: 9.8 g/dL — ABNORMAL LOW (ref 11.6–15.9)
LYMPH#: 3 10*3/uL (ref 0.9–3.3)
LYMPH%: 24 % (ref 14.0–49.7)
MCH: 24.2 pg — ABNORMAL LOW (ref 25.1–34.0)
MCHC: 31.8 g/dL (ref 31.5–36.0)
MCV: 76.1 fL — ABNORMAL LOW (ref 79.5–101.0)
MONO#: 1.1 10*3/uL — AB (ref 0.1–0.9)
MONO%: 8.6 % (ref 0.0–14.0)
NEUT#: 8.4 10*3/uL — ABNORMAL HIGH (ref 1.5–6.5)
NEUT%: 66.9 % (ref 38.4–76.8)
Platelets: 167 10*3/uL (ref 145–400)
RBC: 4.06 10*6/uL (ref 3.70–5.45)
RDW: 22.9 % — ABNORMAL HIGH (ref 11.2–14.5)
WBC: 12.6 10*3/uL — AB (ref 3.9–10.3)

## 2016-06-13 MED ORDER — CLOTRIMAZOLE-BETAMETHASONE 1-0.05 % EX CREA: 1.0000 "application " | TOPICAL_CREAM | Freq: Two times a day (BID) | CUTANEOUS | 0 refills | Status: DC

## 2016-06-13 MED ORDER — LACTULOSE 10 GM/15ML PO SOLN: 10.0000 g | Freq: Three times a day (TID) | ORAL | 0 refills | Status: DC

## 2016-06-13 NOTE — Progress Notes (Signed)
Patient Care Team: Jearld Fenton, NP as PCP - General (Internal Medicine) Servando Salina, MD as Consulting Physician (Obstetrics and Gynecology) Nicholas Lose, MD as Consulting Physician (Hematology and Oncology) Gardenia Phlegm, NP as Nurse Practitioner (Hematology and Oncology)  DIAGNOSIS:  Encounter Diagnoses  Name Primary?  . Malignant neoplasm of upper-outer quadrant of left breast in female, estrogen receptor negative ([place]) Yes  . Drug-induced constipation   . Skin rash     SUMMARY OF ONCOLOGIC HISTORY:   Malignant neoplasm of upper-outer quadrant of left breast in female, estrogen receptor negative ([place])   12/19/2015 Initial Diagnosis    Left breast biopsy: IDC with DCI S, grade 3, ER 0%, PR 0%, KIC system 40%, her 2 positive ratio 6.15, copy number 16.5; palpable lump left breast 1.6 cm irregular oval mass, T1cN0 stage I a clinical stage      01/17/2016 Surgery    Left Lumpectomy: IDC 4.1 cm with DCIS, Margin 0.1 cm, 1/3 LN Positive, ER/PR Neg, Her 2 Pos with Ki 67 of 40%, T2N1 (stage 2B)      02/22/2016 -  Chemotherapy    Adjuvant chemotherapy with TCH Perjeta 6 cycles followed by Herceptin Perjeta maintenance        CHIEF COMPLIANT: Cycle 6 day 8 TCHP  INTERVAL HISTORY: Brianna James is a 36 year old with above-mentioned history left breast cancer treated with lumpectomy and recently completed her final cycle of TCHP. Today is cycle 6 day 8 of Brianna James.  She is constipated again and nauseated.  She says that she has been taking a limited number of her anti-emetics since they contribute to her constipation.  She is eating a drinking, but it is slightly less than typical.  She has not vomited.  She did take some mag citrate and used an enema last night.  She denies peripheral neuropathy.  She does have a rash in her gluteal fold that is itching and she isn't sure what is causing it.    REVIEW OF SYSTEMS:   Review of Systems  Constitutional: Positive  for malaise/fatigue. Negative for chills, fever and weight loss.  HENT: Positive for congestion (clear drainage) and sore throat (at night and in the morning). Negative for hearing loss.   Eyes: Negative for blurred vision and double vision.  Respiratory: Negative for cough and shortness of breath.   Cardiovascular: Negative for chest pain, palpitations, orthopnea and leg swelling.  Gastrointestinal: Positive for constipation and nausea. Negative for abdominal pain, blood in stool, diarrhea, heartburn, melena and vomiting.  Skin: Positive for itching and rash.  Neurological: Negative for dizziness, tingling, sensory change, weakness and headaches.  Endo/Heme/Allergies: Positive for environmental allergies. Does not bruise/bleed easily.  Psychiatric/Behavioral: Negative for depression. The patient is not nervous/anxious and does not have insomnia.      I have reviewed the past medical history, past surgical history, social history and family history with the patient and they are unchanged from previous note.   ALLERGIES:  is allergic to other; oxycodone; and peanuts [peanut oil].  MEDICATIONS:  Current Outpatient Prescriptions  Medication Sig Dispense Refill  . dexamethasone (DECADRON) 4 MG tablet Take 1 tablet (4 mg total) by mouth daily. Take 1 tablet daily before and 1 tablet day after chemotherapy 30 tablet 1  . lidocaine-prilocaine (EMLA) cream Apply to affected area once 30 g 3  . loratadine (CLARITIN) 10 MG tablet Take 10 mg by mouth daily.    Marland Kitchen LORazepam (ATIVAN) 0.5 MG tablet Take 1 tablet (0.5  mg total) by mouth at bedtime. As needed for sleep 30 tablet 0  . magnesium citrate SOLN Take 1 Bottle by mouth once.    . ondansetron (ZOFRAN) 8 MG tablet Take 1 tablet (8 mg total) by mouth 2 (two) times daily as needed for refractory nausea / vomiting. Start on day 3 after chemo. 30 tablet 1  . Prenatal Multivit-Min-Fe-FA (PRENATAL VITAMINS PO) Take 2 tablets by mouth.    .  prochlorperazine (COMPAZINE) 10 MG tablet Take 1 tablet (10 mg total) by mouth every 6 (six) hours as needed (Nausea or vomiting). 30 tablet 1  . clotrimazole-betamethasone (LOTRISONE) cream Apply 1 application topically 2 (two) times daily. 30 g 0  . lactulose (CHRONULAC) 10 GM/15ML solution Take 15 mLs (10 g total) by mouth 3 (three) times daily. 240 mL 0   No current facility-administered medications for this visit.    Facility-Administered Medications Ordered in Other Visits  Medication Dose Route Frequency Provider Last Rate Last Dose  . sodium chloride flush (NS) 0.9 % injection 10 mL  10 mL Intracatheter PRN Nicholas Lose, MD   10 mL at 05/16/16 1540    PHYSICAL EXAMINATION: ECOG PERFORMANCE STATUS: 1 - Symptomatic but completely ambulatory  Vitals:   06/13/16 1000  BP: 113/75  Pulse: 99  Resp: 18  Temp: 98.6 F (37 C)   Filed Weights   06/13/16 1000  Weight: 226 lb 3.2 oz (102.6 kg)    GENERAL: Patient is a well appearing female in no acute distress HEENT:  Sclerae anicteric. PERRL  Oropharynx clear and moist. No ulcerations or evidence of oropharyngeal candidiasis. Neck is supple.  NODES:  No cervical, supraclavicular, infraclavicular, or axillary lymphadenopathy palpated.  BREAST EXAM:  Deferred. LUNGS:  Clear to auscultation bilaterally.  No wheezes or rhonchi. HEART:  Regular rate and rhythm. No murmur appreciated. ABDOMEN:  Soft, nontender.  Positive, normoactive bowel sounds. No organomegaly palpated. MSK:  No focal spinal tenderness to palpation. Full range of motion bilaterally in the upper extremities. EXTREMITIES:  No peripheral edema.   SKIN:  Hyperpigmentation with slight erythema surrounding in upper gluteal fold spanning about 5cm, hyperpigmentation along bilateral outer axilla, fingernails are darkened at the nail beds NEURO:  Nonfocal. Well oriented.  Appropriate affect.      LABORATORY DATA:  I have reviewed the data as listed   Chemistry       Component Value Date/Time   NA 137 06/13/2016 0945   K 4.1 06/13/2016 0945   CL 98 (L) 02/29/2016 0148   CO2 28 06/13/2016 0945   BUN 10.6 06/13/2016 0945   CREATININE 0.9 06/13/2016 0945      Component Value Date/Time   CALCIUM 9.4 06/13/2016 0945   ALKPHOS 153 (H) 06/13/2016 0945   AST 26 06/13/2016 0945   ALT 28 06/13/2016 0945   BILITOT 0.34 06/13/2016 0945       Lab Results  Component Value Date   WBC 12.6 (H) 06/13/2016   HGB 9.8 (L) 06/13/2016   HCT 30.9 (L) 06/13/2016   MCV 76.1 (L) 06/13/2016   PLT 167 06/13/2016   NEUTROABS 8.4 (H) 06/13/2016    ASSESSMENT & PLAN:  Malignant neoplasm of upper-outer quadrant of left breast in female, estrogen receptor negative ([place]) 01/17/16: Left Lumpectomy: 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 Adjuvant chemotherapy with TCHPX 6 cycles followed by Herceptin-Perjetamaintenance for one year  2. Followed by adjuvant radiation ---------------------------------------------------------------------------------------------------------------------------------------------------- Current treatment: Cycle 6 day 8 TCHP Chemotherapy toxicities: "Brianna James" is fatigued today.  She is very delighted to be finished with the chemotherapy portion of her treatment.  Her labs are stable and they were reviewed with her in detail.   1. Rash: ? Fungal etiology.  Prescribed Lotrisone cream BID PRN. 2. Constipation: Lactulose has helped in the past, so I sent in some more in case she needs to take it.   3. Nausea, decreased appetite: Patient encouraged to continue with fluid intake, small frequent bland meals, and anti-emetics PRN.  4. Seasonal allergies: recommended she take claritin, allegra, or zyrtec in combination with flonase   Dispo: -Appointment with Dr. Haroldine Laws and echocardiogram for cardiotoxicity monitoring on 06/20/16 -Appointment with Dr. Isidore Moos in  radiation oncology on 06/24/16 -Appointment for Herceptin/Perjeta on 06/27/16 -Appointment for Labs, office visit with Dr. Lindi Adie, Herceptin/Perjeta on 07/18/16   The patient has a good understanding of the overall plan. she agrees with it. she will call with any problems that may develop before the next visit here.  Dr. Lindi Adie saw the patient in conjunction with myself and is in agreement with the above plan.    A total of (30) minutes of face-to-face time was spent with this patient with greater than 50% of that time in counseling and care-coordination.  Gardenia Phlegm, NP 06/13/16   Attending Note  I personally saw and examined Brianna James. The plan of care was discussed with her. I agree with the assessment and plan as documented above. Patient completed adjuvant chemotherapy portion of her treatment. She will continue with adjuvant Herceptin and Perjeta. She was having issues with constipation whenever she takes antinausea medication. I will see her back with her maintenance Herceptin and Perjeta. She is so happy to have completed chemotherapy  Signed Rulon Eisenmenger, MD

## 2016-06-20 ENCOUNTER — Ambulatory Visit (HOSPITAL_BASED_OUTPATIENT_CLINIC_OR_DEPARTMENT_OTHER)
Admission: RE | Admit: 2016-06-20 | Discharge: 2016-06-20 | Disposition: A | Discharge status: Home / Self Care | Payer: 59 | Source: Ambulatory Visit | Attending: Internal Medicine | Admitting: Internal Medicine

## 2016-06-20 ENCOUNTER — Encounter (HOSPITAL_COMMUNITY): Payer: Self-pay | Admitting: Internal Medicine

## 2016-06-20 ENCOUNTER — Ambulatory Visit (HOSPITAL_COMMUNITY)
Admission: RE | Admit: 2016-06-20 | Discharge: 2016-06-20 | Disposition: A | Discharge status: Home / Self Care | Payer: 59 | Source: Ambulatory Visit | Attending: Internal Medicine | Admitting: Internal Medicine

## 2016-06-20 VITALS — BP 114/68 | HR 90 | Wt 230.2 lb

## 2016-06-20 DIAGNOSIS — Z8261 Family history of arthritis: Secondary | ICD-10-CM | POA: Insufficient documentation

## 2016-06-20 DIAGNOSIS — Z171 Estrogen receptor negative status [ER-]: Secondary | ICD-10-CM | POA: Insufficient documentation

## 2016-06-20 DIAGNOSIS — Z803 Family history of malignant neoplasm of breast: Secondary | ICD-10-CM | POA: Diagnosis not present

## 2016-06-20 DIAGNOSIS — E669 Obesity, unspecified: Secondary | ICD-10-CM | POA: Insufficient documentation

## 2016-06-20 DIAGNOSIS — C50412 Malignant neoplasm of upper-outer quadrant of left female breast: Secondary | ICD-10-CM | POA: Insufficient documentation

## 2016-06-20 DIAGNOSIS — Z888 Allergy status to other drugs, medicaments and biological substances status: Secondary | ICD-10-CM | POA: Diagnosis not present

## 2016-06-20 DIAGNOSIS — Z853 Personal history of malignant neoplasm of breast: Secondary | ICD-10-CM | POA: Diagnosis not present

## 2016-06-20 DIAGNOSIS — Z79899 Other long term (current) drug therapy: Secondary | ICD-10-CM | POA: Insufficient documentation

## 2016-06-20 DIAGNOSIS — Z808 Family history of malignant neoplasm of other organs or systems: Secondary | ICD-10-CM | POA: Insufficient documentation

## 2016-06-20 DIAGNOSIS — Z823 Family history of stroke: Secondary | ICD-10-CM | POA: Diagnosis not present

## 2016-06-20 DIAGNOSIS — Z801 Family history of malignant neoplasm of trachea, bronchus and lung: Secondary | ICD-10-CM | POA: Diagnosis not present

## 2016-06-20 NOTE — Progress Notes (Signed)
  Echocardiogram 2D Echocardiogram has been performed.  Darlina Sicilian M 06/20/2016, 9:26 AM

## 2016-06-20 NOTE — Addendum Note (Signed)
Encounter addended by: Scarlette Calico, RN on: 06/20/2016 11:12 AM<BR>    Actions taken: Order list changed, Diagnosis association updated

## 2016-06-20 NOTE — Addendum Note (Signed)
Encounter addended by: Scarlette Calico, RN on: 06/20/2016 11:07 AM<BR>    Actions taken: Sign clinical note

## 2016-06-20 NOTE — Patient Instructions (Signed)
We will contact you in 3 months to schedule your next appointment and echocardiogram  

## 2016-06-20 NOTE — Progress Notes (Signed)
CARDIO-ONCOLOGY CLINIC  NOTE  Referring Physician: Lindi Adie   HPI: 36 y/o woman with h/o obesity and left breast cancer (ER/PR negative, HER-2 +) diagnosed 10/17 referred to Cardio-Oncology Clinic by Dr. Lindi Adie.   Denies any h/o known cardiac disease.   Breast cancer of upper-outer quadrant of left female breast ([place])   12/19/2015 Initial Diagnosis    Left breast biopsy: IDC with DCI S, grade 3, ER 0%, PR 0%, KIC system 40%, her 2 positive ratio 6.15, copy number 16.5; palpable lump left breast 1.6 cm irregular oval mass, T1cN0 stage I a clinical stage     01/17/2016 Surgery    Left Lumpectomy: IDC 4.1 cm with DCIS, Margin 0.1 cm, 1/3 LN Positive, ER/PR Neg, Her 2 Pos with Ki 67 of 40%, T2N1 (stage 2B)    Overall doing well. Has finished upfront chemo 1.5 weeks ago. Now getting Hercept/Perjeta q 3 weeks. About to start XRT. Feels fine. No CHF symptoms. Hair growing back   Echo 02/20/16: EF 60-65% Lateral s' 12.1 cm/s. GLS -20.8% Echo 06/20/16 Personally reviewed 55-60% Lateral s' 10.6 GLS -18.5%   Past Medical History:  Diagnosis Date  . Cancer ([place]) 01/2016   breast  . Chicken pox   . Hematoma    left breast  . HSV infection   . PONV (postoperative nausea and vomiting)     Current Outpatient Prescriptions  Medication Sig Dispense Refill  . clotrimazole-betamethasone (LOTRISONE) cream Apply 1 application topically 2 (two) times daily. 30 g 0  . dexamethasone (DECADRON) 4 MG tablet Take 1 tablet (4 mg total) by mouth daily. Take 1 tablet daily before and 1 tablet day after chemotherapy 30 tablet 1  . lactulose (CHRONULAC) 10 GM/15ML solution Take 15 mLs (10 g total) by mouth 3 (three) times daily. 240 mL 0  . lidocaine-prilocaine (EMLA) cream Apply to affected area once 30 g 3  . loratadine (CLARITIN) 10 MG tablet Take 10 mg by mouth daily.    Marland Kitchen LORazepam (ATIVAN) 0.5 MG tablet Take 1 tablet (0.5 mg total) by mouth at bedtime. As needed for sleep 30 tablet 0  . magnesium  citrate SOLN Take 1 Bottle by mouth once.    . Prenatal Multivit-Min-Fe-FA (PRENATAL VITAMINS PO) Take 2 tablets by mouth.    . ondansetron (ZOFRAN) 8 MG tablet Take 1 tablet (8 mg total) by mouth 2 (two) times daily as needed for refractory nausea / vomiting. Start on day 3 after chemo. (Patient not taking: Reported on 06/20/2016) 30 tablet 1  . prochlorperazine (COMPAZINE) 10 MG tablet Take 1 tablet (10 mg total) by mouth every 6 (six) hours as needed (Nausea or vomiting). (Patient not taking: Reported on 06/20/2016) 30 tablet 1   No current facility-administered medications for this encounter.    Facility-Administered Medications Ordered in Other Encounters  Medication Dose Route Frequency Provider Last Rate Last Dose  . sodium chloride flush (NS) 0.9 % injection 10 mL  10 mL Intracatheter PRN Nicholas Lose, MD   10 mL at 05/16/16 1540    Allergies  Allergen Reactions  . Other Swelling    Magic Mouthwash w/ lidocaine  . Oxycodone Nausea And Vomiting    Pt reports adverse reaction.   . Peanuts [Peanut Oil] Hives      Social History   Social History  . Marital status: Married    Spouse name: N/A  . Number of children: N/A  . Years of education: N/A   Occupational History  . Not on file.  Social History Main Topics  . Smoking status: Never Smoker  . Smokeless tobacco: Never Used  . Alcohol use 0.0 oz/week     Comment: rare - wine maybe 2x per yr  . Drug use: No  . Sexual activity: Yes    Birth control/ protection: None   Other Topics Concern  . Not on file   Social History Narrative  . No narrative on file      Family History  Problem Relation Age of Onset  . Arthritis Mother   . Stroke Mother   . Stroke Maternal Aunt   . Stroke Maternal Uncle   . Breast cancer Maternal Grandmother     dx at 43 or younger; d. 78y  . Lung cancer Maternal Grandfather   . Stroke Paternal Grandmother   . Uterine cancer Paternal Grandmother     dx. 61-62  . Other Paternal  Grandmother     colostomy bag in her 42s; unspecified reason  . Lung cancer Paternal Grandfather   . Uterine cancer Maternal Aunt     dx unspecified age  . Breast cancer Other     maternal great grandmother (MGM's mother) d. 55    Vitals:   06/20/16 1031  BP: 114/68  Pulse: 90  SpO2: 99%  Weight: 230 lb 4 oz (104.4 kg)    PHYSICAL EXAM: General:  Well appearing. No resp difficulty HEENT: normal  Hair growing back  Neck: supple. no JVD. Carotids 2+ bilat; no bruits. No lymphadenopathy or thryomegaly appreciated. Cor: PMI nondisplaced. Regular rate & rhythm. No rubs, gallops or murmurs. R-sided port Lungs: clear Abdomen: obese, soft, nontender, nondistended. No hepatosplenomegaly. No bruits or masses. Good bowel sounds. Extremities: no cyanosis, clubbing, rash, edema Neuro: alert & orientedx3, cranial nerves grossly intact. moves all 4 extremities w/o difficulty. Affect pleasant   ASSESSMENT & PLAN:  1. Breast cancer of upper-outer quadrant of left female breast ([place]) --01/17/16: Left Lumpectomy: IDC 4.1 cm with DCIS, Margin 0.1 cm, 1/3 LN Positive, ER/PR Neg, Her 2 Pos with Ki 67 of 40%, T2N1 (stage 2B) - Has complete Adjuvant chemotherapy with TCHP X 6 cycles  - Now on Herceptin-Perjeta maintenance for one year. About to start adjuvant radiation - I reviewed echos personally. EF and Doppler parameters stable. No HF on exam. Continue Herceptin.  - F/u echo in 3 months  Italo Banton,MD 10:53 AM

## 2016-06-24 ENCOUNTER — Ambulatory Visit
Admission: RE | Admit: 2016-06-24 | Discharge: 2016-06-24 | Disposition: A | Discharge status: Home / Self Care | Payer: 59 | Source: Ambulatory Visit | Attending: Radiation Oncology | Admitting: Radiation Oncology

## 2016-06-24 ENCOUNTER — Encounter: Payer: Self-pay | Admitting: Radiation Oncology

## 2016-06-24 DIAGNOSIS — Z170 Estrogen receptor positive status [ER+]: Secondary | ICD-10-CM | POA: Insufficient documentation

## 2016-06-24 DIAGNOSIS — Z823 Family history of stroke: Secondary | ICD-10-CM | POA: Insufficient documentation

## 2016-06-24 DIAGNOSIS — Z171 Estrogen receptor negative status [ER-]: Secondary | ICD-10-CM

## 2016-06-24 DIAGNOSIS — Z9221 Personal history of antineoplastic chemotherapy: Secondary | ICD-10-CM | POA: Diagnosis not present

## 2016-06-24 DIAGNOSIS — C50412 Malignant neoplasm of upper-outer quadrant of left female breast: Secondary | ICD-10-CM | POA: Insufficient documentation

## 2016-06-24 DIAGNOSIS — Z803 Family history of malignant neoplasm of breast: Secondary | ICD-10-CM | POA: Insufficient documentation

## 2016-06-24 DIAGNOSIS — Z801 Family history of malignant neoplasm of trachea, bronchus and lung: Secondary | ICD-10-CM | POA: Diagnosis not present

## 2016-06-24 DIAGNOSIS — Z888 Allergy status to other drugs, medicaments and biological substances status: Secondary | ICD-10-CM | POA: Diagnosis not present

## 2016-06-24 DIAGNOSIS — Z8261 Family history of arthritis: Secondary | ICD-10-CM | POA: Diagnosis not present

## 2016-06-24 DIAGNOSIS — D0512 Intraductal carcinoma in situ of left breast: Secondary | ICD-10-CM | POA: Diagnosis not present

## 2016-06-24 NOTE — Progress Notes (Signed)
Radiation Oncology         [phone] ________________________________  Name: Brianna James MRN: [id number]  Date: 06/24/2016  DOB: 1980-12-09  Follow-Up Visit Note  Outpatient  CC: Webb Silversmith, NP  [name], MD  Diagnosis:      ICD-9-CM ICD-10-CM   1. Malignant neoplasm of upper-outer quadrant of left breast in female, estrogen receptor negative ([place]) 174.4 C50.412    V86.1 Z17.1   Clinical Stage T1cN0M0 Left Breast UOQ Invasive Ductal Carcinoma w/ DCIS, ER- / PR- / Her2+, Grade III Pathologic Stage T2N1a   CHIEF COMPLAINT: Here to discuss management of left breast cancer  Narrative:  The patient returns today for follow-up.     Since consultation, she underwent lumpectomy and sentinel lymph node biopsy with Dr Barry Dienes on 01-17-16.  This revealed a 4.1cm tumor with close posterior margins (<64m to invasive and in situ disease, broadly) and 1 of 3 SLN were positive (but no ECE).    She then proceeded to receive adjuvant chemotherapy with Dr. GLindi Adie TCHx6 cycles completed 06-06-16.  Herceptin and Perjeta are continuing.   She underwent IVF prior to chemotherapy.  She is doing well overall, with no complaints. She has not been sexually active for many months, denies any chance of pregnancy.            ALLERGIES:  is allergic to other; oxycodone; and peanuts [peanut oil].  Meds: Current Outpatient Prescriptions  Medication Sig Dispense Refill  . loratadine (CLARITIN) 10 MG tablet Take 10 mg by mouth daily.    . clotrimazole-betamethasone (LOTRISONE) cream Apply 1 application topically 2 (two) times daily. (Patient not taking: Reported on 06/24/2016) 30 g 0  . dexamethasone (DECADRON) 4 MG tablet Take 1 tablet (4 mg total) by mouth daily. Take 1 tablet daily before and 1 tablet day after chemotherapy (Patient not taking: Reported on 06/24/2016) 30 tablet 1  . lactulose (CHRONULAC) 10 GM/15ML solution Take 15 mLs (10 g total) by mouth 3 (three) times daily. (Patient not  taking: Reported on 06/24/2016) 240 mL 0  . lidocaine-prilocaine (EMLA) cream Apply to affected area once (Patient not taking: Reported on 06/24/2016) 30 g 3  . LORazepam (ATIVAN) 0.5 MG tablet Take 1 tablet (0.5 mg total) by mouth at bedtime. As needed for sleep (Patient not taking: Reported on 06/24/2016) 30 tablet 0  . magnesium citrate SOLN Take 1 Bottle by mouth once.    . ondansetron (ZOFRAN) 8 MG tablet Take 1 tablet (8 mg total) by mouth 2 (two) times daily as needed for refractory nausea / vomiting. Start on day 3 after chemo. (Patient not taking: Reported on 06/20/2016) 30 tablet 1  . Prenatal Multivit-Min-Fe-FA (PRENATAL VITAMINS PO) Take 2 tablets by mouth.    . prochlorperazine (COMPAZINE) 10 MG tablet Take 1 tablet (10 mg total) by mouth every 6 (six) hours as needed (Nausea or vomiting). (Patient not taking: Reported on 06/20/2016) 30 tablet 1   No current facility-administered medications for this encounter.    Facility-Administered Medications Ordered in Other Encounters  Medication Dose Route Frequency Provider Last Rate Last Dose  . sodium chloride flush (NS) 0.9 % injection 10 mL  10 mL Intracatheter PRN VNicholas Lose MD   10 mL at 05/16/16 1540    Physical Findings:  height is 5' 7" (1.702 m) and weight is 229 lb 6.4 oz (104.1 kg). Her oral temperature is 98.4 F (36.9 C). Her blood pressure is 118/78 and her pulse is 87. Her respiration is 18. .Marland Kitchen  General: Alert and oriented, in no acute distress HEENT: Head is normocephalic. Extraocular movements are intact. Oropharynx is clear. Neck: Neck is supple, no palpable cervical or supraclavicular lymphadenopathy. Abdomen: Soft, nontender, nondistended, with no rigidity or guarding. Extremities: No cyanosis or edema. Lymphatics: see Neck Exam Musculoskeletal: symmetric strength and muscle tone throughout. Neurologic: No obvious focalities. Speech is fluent.  Psychiatric: Judgment and insight are intact. Affect is  appropriate. Breast exam reveals well healed left lumpectomy/axilla scar; no palpable masses bilaterally   Lab Findings: Lab Results  Component Value Date   WBC 12.6 (H) 06/13/2016   HGB 9.8 (L) 06/13/2016   HCT 30.9 (L) 06/13/2016   MCV 76.1 (L) 06/13/2016   PLT 167 06/13/2016      Radiographic Findings: No results found.  Impression/Plan: Left breast cancer We discussed adjuvant radiotherapy today.  I recommend radiotherapy to the left breast and axillary and SCV nodes in order to reduce risk of locoregional recurrence by 2/3.  The risks, benefits and side effects of this treatment were discussed in detail.  She understands that radiotherapy is associated with skin irritation and fatigue in the acute setting. Late effects can include cosmetic changes and rare injury to internal organs.   She is enthusiastic about proceeding with treatment. A consent form has been been signed and placed in her chart. Simulation is today with DIBH technique.  A total of 5 medically necessary complex treatment devices will be fabricated and supervised by me: 4 fields with MLCs for custom blocks to protect heart, and lungs;  and, a Vac-lok. MORE COMPLEX DEVICES MAY BE MADE IN DOSIMETRY FOR FIELD IN FIELD BEAMS FOR DOSE HOMOGENEITY.  I have requested : 3D Simulation which is medically necessary to give adequate dose to at risk tissues while sparing lungs and heart.  I have requested a DVH of the following structures: lungs, heart, esophagus, lumpectomy cavity.    The patient will receive 50 Gy in 25 fractions to the left breast with 2 fields.  The nodes will also be treated with 2 opposed fields to 45Gy. This will be followed by a lumpectomy boost.  I spent 30 minutes face to face with the patient and more than 50% of that time was spent in counseling and/or coordination of care. _____________________________________   Sarah Squire, MD 

## 2016-06-24 NOTE — Progress Notes (Signed)
Radiation Oncology         [phone] ________________________________  Name: Brianna James MRN: [id number]  Date: 06/24/2016  DOB: Apr 28, 1980  SIMULATION AND TREATMENT PLANNING NOTE /// Special treatment  Outpatient  DIAGNOSIS:     ICD-9-CM ICD-10-CM   1. Malignant neoplasm of upper-outer quadrant of left breast in female, estrogen receptor negative ([place]) 174.4 C50.412    V86.1 Z17.1     NARRATIVE:  The patient was brought to the [place].  Identity was confirmed.  All relevant records and images related to the planned course of therapy were reviewed.  The patient freely provided informed written consent to proceed with treatment after reviewing the details related to the planned course of therapy. The consent form was witnessed and verified by the simulation staff.    Then, the patient was set-up in a stable reproducible supine position for radiation therapy with her ipsilateral arm over her head, and her upper body secured in a custom-made Vac-lok device.  CT images were obtained.  Surface markings were placed.  The CT images were loaded into the planning software.    Special treatment procedure was performed today due to the extra time and effort required by myself to plan and prepare this patient for deep inspiration breath hold technique.  I have determined cardiac sparing to be of benefit to this patient to prevent long term cardiac damage due to radiation of the heart.  Bellows were placed on the patient's abdomen. To facilitate cardiac sparing, the patient was coached by the radiation therapists on breath hold techniques and breathing practice was performed. Practice waveforms were obtained. The patient was then scanned while maintaining breath hold in the treatment position.  This image was then transferred over to the imaging specialist. The imaging specialist then created a fusion of the free breathing and breath hold scans using the chest wall as the stable  structure. I personally reviewed the fusion in axial, coronal and sagittal image planes.  Excellent cardiac sparing was obtained.  I felt the patient is an appropriate candidate for breath hold and the patient will be treated as such.  The image fusion was then reviewed with the patient to reinforce the necessity of reproducible breath hold.   TREATMENT PLANNING NOTE: Treatment planning then occurred.  The radiation prescription was entered and confirmed.     A total of 5 medically necessary complex treatment devices were fabricated and supervised by me: 4 fields with MLCs for custom blocks to protect heart, and lungs;  and, a Vac-lok. MORE COMPLEX DEVICES MAY BE MADE IN DOSIMETRY FOR FIELD IN FIELD BEAMS FOR DOSE HOMOGENEITY.  I have requested : 3D Simulation which is medically necessary to give adequate dose to at risk tissues while sparing lungs and heart.  I have requested a DVH of the following structures: lungs, heart, lumpectomy cavity, esophagus.    The patient will receive 50 Gy in 25 fractions to the left breast with 2 tangential fields. The posterior axilla and SCV will receive 45 Gy in 25 fractions with 2 opposed fields. This will be followed by a boost.  Optical Surface Tracking Plan:  Since intensity modulated radiotherapy (IMRT) and 3D conformal radiation treatment methods are predicated on accurate and precise positioning for treatment, intrafraction motion monitoring is medically necessary to ensure accurate and safe treatment delivery. The ability to quantify intrafraction motion without excessive ionizing radiation dose can only be performed with optical surface tracking. Accordingly, surface imaging offers the opportunity to  obtain 3D measurements of patient position throughout IMRT and 3D treatments without excessive radiation exposure. I am ordering optical surface tracking for this patient's upcoming course of radiotherapy.  ________________________________   Reference:  Ursula Alert, J, et al. Surface imaging-based analysis of intrafraction motion for breast radiotherapy patients.Journal of [place], n. 6, nov. 2014. ISSN [id number].  Available at: <http://www.jacmp.org/index.php/jacmp/article/view/4957>.    -----------------------------------  Eppie Gibson, MD

## 2016-06-27 ENCOUNTER — Other Ambulatory Visit (HOSPITAL_BASED_OUTPATIENT_CLINIC_OR_DEPARTMENT_OTHER): Payer: 59

## 2016-06-27 ENCOUNTER — Other Ambulatory Visit: Payer: Self-pay | Admitting: Hematology and Oncology

## 2016-06-27 ENCOUNTER — Ambulatory Visit (HOSPITAL_BASED_OUTPATIENT_CLINIC_OR_DEPARTMENT_OTHER): Payer: 59

## 2016-06-27 ENCOUNTER — Other Ambulatory Visit: Payer: Self-pay | Admitting: Oncology

## 2016-06-27 VITALS — BP 110/70 | HR 94 | Temp 98.6°F | Resp 18

## 2016-06-27 DIAGNOSIS — Z171 Estrogen receptor negative status [ER-]: Principal | ICD-10-CM

## 2016-06-27 DIAGNOSIS — C773 Secondary and unspecified malignant neoplasm of axilla and upper limb lymph nodes: Secondary | ICD-10-CM | POA: Diagnosis not present

## 2016-06-27 DIAGNOSIS — C50412 Malignant neoplasm of upper-outer quadrant of left female breast: Secondary | ICD-10-CM

## 2016-06-27 DIAGNOSIS — Z5112 Encounter for antineoplastic immunotherapy: Secondary | ICD-10-CM

## 2016-06-27 LAB — COMPREHENSIVE METABOLIC PANEL
ALT: 16 U/L (ref 0–55)
AST: 23 U/L (ref 5–34)
Albumin: 3.7 g/dL (ref 3.5–5.0)
Alkaline Phosphatase: 102 U/L (ref 40–150)
Anion Gap: 9 mEq/L (ref 3–11)
BILIRUBIN TOTAL: 0.33 mg/dL (ref 0.20–1.20)
BUN: 9.3 mg/dL (ref 7.0–26.0)
CO2: 26 meq/L (ref 22–29)
Calcium: 9.5 mg/dL (ref 8.4–10.4)
Chloride: 105 mEq/L (ref 98–109)
Creatinine: 0.9 mg/dL (ref 0.6–1.1)
EGFR: >90 mL/min/{1.73_m2} (ref >90)
GLUCOSE: 96 mg/dL (ref 70–140)
Potassium: 4.4 mEq/L (ref 3.5–5.1)
SODIUM: 140 meq/L (ref 136–145)
TOTAL PROTEIN: 6.8 g/dL (ref 6.4–8.3)

## 2016-06-27 LAB — CBC WITH DIFFERENTIAL/PLATELET
BASO%: 0.2 % (ref 0.0–2.0)
BASOS ABS: 0 10*3/uL (ref 0.0–0.1)
EOS%: 0.2 % (ref 0.0–7.0)
Eosinophils Absolute: 0 10*3/uL (ref 0.0–0.5)
HEMATOCRIT: 32.2 % — AB (ref 34.8–46.6)
HGB: 10 g/dL — ABNORMAL LOW (ref 11.6–15.9)
LYMPH%: 46.7 % (ref 14.0–49.7)
MCH: 24.7 pg — AB (ref 25.1–34.0)
MCHC: 31.1 g/dL — ABNORMAL LOW (ref 31.5–36.0)
MCV: 79.5 fL (ref 79.5–101.0)
MONO#: 0.5 10*3/uL (ref 0.1–0.9)
MONO%: 9.4 % (ref 0.0–14.0)
NEUT#: 2.3 10*3/uL (ref 1.5–6.5)
NEUT%: 43.5 % (ref 38.4–76.8)
Platelets: 253 10*3/uL (ref 145–400)
RBC: 4.05 10*6/uL (ref 3.70–5.45)
RDW: 21.2 % — ABNORMAL HIGH (ref 11.2–14.5)
WBC: 5.3 10*3/uL (ref 3.9–10.3)
lymph#: 2.5 10*3/uL (ref 0.9–3.3)

## 2016-06-27 MED ORDER — ACETAMINOPHEN 325 MG PO TABS
ORAL_TABLET | ORAL | Status: AC
  Filled 2016-06-27: qty 2

## 2016-06-27 MED ORDER — ACETAMINOPHEN 325 MG PO TABS
650.0000 mg | ORAL_TABLET | Freq: Once | ORAL | Status: AC
  Administered 2016-06-27: 650 mg via ORAL

## 2016-06-27 MED ORDER — SODIUM CHLORIDE 0.9 % IV SOLN
420.0000 mg | Freq: Once | INTRAVENOUS | Status: AC
  Administered 2016-06-27: 420 mg via INTRAVENOUS
  Filled 2016-06-27: qty 14

## 2016-06-27 MED ORDER — DIPHENHYDRAMINE HCL 25 MG PO CAPS
ORAL_CAPSULE | ORAL | Status: AC
  Filled 2016-06-27: qty 2

## 2016-06-27 MED ORDER — HEPARIN SOD (PORK) LOCK FLUSH 100 UNIT/ML IV SOLN
500.0000 [IU] | Freq: Once | INTRAVENOUS | Status: AC | PRN
  Administered 2016-06-27: 500 [IU]
  Filled 2016-06-27: qty 5

## 2016-06-27 MED ORDER — SODIUM CHLORIDE 0.9 % IV SOLN
Freq: Once | INTRAVENOUS | Status: AC
  Administered 2016-06-27: 10:00:00 via INTRAVENOUS

## 2016-06-27 MED ORDER — DIPHENHYDRAMINE HCL 25 MG PO CAPS
50.0000 mg | ORAL_CAPSULE | Freq: Once | ORAL | Status: AC
  Administered 2016-06-27: 50 mg via ORAL

## 2016-06-27 MED ORDER — TRASTUZUMAB CHEMO 150 MG IV SOLR
6.0000 mg/kg | Freq: Once | INTRAVENOUS | Status: AC
  Administered 2016-06-27: 588 mg via INTRAVENOUS
  Filled 2016-06-27: qty 28

## 2016-06-27 MED ORDER — SODIUM CHLORIDE 0.9% FLUSH
10.0000 mL | INTRAVENOUS | Status: DC | PRN
Start: 2016-06-27 — End: 2016-06-27
  Administered 2016-06-27: 10 mL
  Filled 2016-06-27: qty 10

## 2016-06-27 NOTE — Patient Instructions (Signed)
[facility] Discharge Instructions for Patients Receiving Chemotherapy  Today you received the following chemotherapy agents Herceptin/Perjeta To help prevent nausea and vomiting after your treatment, we encourage you to take your nausea medication as prescribed.   If you develop nausea and vomiting that is not controlled by your nausea medication, call the clinic.   BELOW ARE SYMPTOMS THAT SHOULD BE REPORTED IMMEDIATELY:  *FEVER GREATER THAN 100.5 F  *CHILLS WITH OR WITHOUT FEVER  NAUSEA AND VOMITING THAT IS NOT CONTROLLED WITH YOUR NAUSEA MEDICATION  *UNUSUAL SHORTNESS OF BREATH  *UNUSUAL BRUISING OR BLEEDING  TENDERNESS IN MOUTH AND THROAT WITH OR WITHOUT PRESENCE OF ULCERS  *URINARY PROBLEMS  *BOWEL PROBLEMS  UNUSUAL RASH Items with * indicate a potential emergency and should be followed up as soon as possible.  Feel free to call the clinic you have any questions or concerns. The clinic phone number is [phone].  Please show the CHEMO ALERT CARD at check-in to the Emergency Department and triage nurse.  

## 2016-06-28 DIAGNOSIS — C50412 Malignant neoplasm of upper-outer quadrant of left female breast: Secondary | ICD-10-CM | POA: Diagnosis not present

## 2016-07-01 ENCOUNTER — Ambulatory Visit
Admission: RE | Admit: 2016-07-01 | Discharge: 2016-07-01 | Disposition: A | Discharge status: Home / Self Care | Payer: 59 | Source: Ambulatory Visit | Attending: Radiation Oncology | Admitting: Radiation Oncology

## 2016-07-01 DIAGNOSIS — Z170 Estrogen receptor positive status [ER+]: Secondary | ICD-10-CM | POA: Diagnosis not present

## 2016-07-01 DIAGNOSIS — C50412 Malignant neoplasm of upper-outer quadrant of left female breast: Secondary | ICD-10-CM | POA: Diagnosis not present

## 2016-07-01 DIAGNOSIS — Z9221 Personal history of antineoplastic chemotherapy: Secondary | ICD-10-CM | POA: Diagnosis not present

## 2016-07-02 ENCOUNTER — Ambulatory Visit
Admission: RE | Admit: 2016-07-02 | Discharge: 2016-07-02 | Disposition: A | Discharge status: Home / Self Care | Payer: 59 | Source: Ambulatory Visit | Attending: Radiation Oncology | Admitting: Radiation Oncology

## 2016-07-02 DIAGNOSIS — C50412 Malignant neoplasm of upper-outer quadrant of left female breast: Secondary | ICD-10-CM | POA: Diagnosis not present

## 2016-07-03 ENCOUNTER — Ambulatory Visit
Admission: RE | Admit: 2016-07-03 | Discharge: 2016-07-03 | Disposition: A | Discharge status: Home / Self Care | Payer: 59 | Source: Ambulatory Visit | Attending: Radiation Oncology | Admitting: Radiation Oncology

## 2016-07-03 DIAGNOSIS — C50412 Malignant neoplasm of upper-outer quadrant of left female breast: Secondary | ICD-10-CM | POA: Diagnosis not present

## 2016-07-04 ENCOUNTER — Ambulatory Visit: Payer: 59

## 2016-07-04 ENCOUNTER — Ambulatory Visit
Admission: RE | Admit: 2016-07-04 | Discharge: 2016-07-04 | Disposition: A | Discharge status: Home / Self Care | Payer: 59 | Source: Ambulatory Visit | Attending: Radiation Oncology | Admitting: Radiation Oncology

## 2016-07-04 DIAGNOSIS — C50412 Malignant neoplasm of upper-outer quadrant of left female breast: Secondary | ICD-10-CM | POA: Diagnosis not present

## 2016-07-05 ENCOUNTER — Ambulatory Visit
Admission: RE | Admit: 2016-07-05 | Discharge: 2016-07-05 | Disposition: A | Discharge status: Home / Self Care | Payer: 59 | Source: Ambulatory Visit | Attending: Radiation Oncology | Admitting: Radiation Oncology

## 2016-07-05 DIAGNOSIS — C50412 Malignant neoplasm of upper-outer quadrant of left female breast: Secondary | ICD-10-CM | POA: Diagnosis not present

## 2016-07-08 ENCOUNTER — Ambulatory Visit
Admission: RE | Admit: 2016-07-08 | Discharge: 2016-07-08 | Disposition: A | Discharge status: Home / Self Care | Payer: 59 | Source: Ambulatory Visit | Attending: Radiation Oncology | Admitting: Radiation Oncology

## 2016-07-08 DIAGNOSIS — Z8261 Family history of arthritis: Secondary | ICD-10-CM | POA: Diagnosis not present

## 2016-07-08 DIAGNOSIS — Z801 Family history of malignant neoplasm of trachea, bronchus and lung: Secondary | ICD-10-CM | POA: Insufficient documentation

## 2016-07-08 DIAGNOSIS — Z888 Allergy status to other drugs, medicaments and biological substances status: Secondary | ICD-10-CM | POA: Diagnosis not present

## 2016-07-08 DIAGNOSIS — Z803 Family history of malignant neoplasm of breast: Secondary | ICD-10-CM | POA: Diagnosis not present

## 2016-07-08 DIAGNOSIS — C50412 Malignant neoplasm of upper-outer quadrant of left female breast: Secondary | ICD-10-CM | POA: Insufficient documentation

## 2016-07-08 DIAGNOSIS — Z171 Estrogen receptor negative status [ER-]: Secondary | ICD-10-CM

## 2016-07-08 DIAGNOSIS — Z823 Family history of stroke: Secondary | ICD-10-CM | POA: Diagnosis not present

## 2016-07-08 DIAGNOSIS — Z9221 Personal history of antineoplastic chemotherapy: Secondary | ICD-10-CM | POA: Insufficient documentation

## 2016-07-08 DIAGNOSIS — Z170 Estrogen receptor positive status [ER+]: Secondary | ICD-10-CM | POA: Diagnosis not present

## 2016-07-08 MED ORDER — ALRA NON-METALLIC DEODORANT (RAD-ONC)
1.0000 "application " | Freq: Once | TOPICAL | Status: AC
  Administered 2016-07-08: 1 via TOPICAL

## 2016-07-08 MED ORDER — RADIAPLEXRX EX GEL
Freq: Once | CUTANEOUS | Status: AC
  Administered 2016-07-08: 12:00:00 via TOPICAL

## 2016-07-08 NOTE — Progress Notes (Signed)

## 2016-07-09 ENCOUNTER — Ambulatory Visit
Admission: RE | Admit: 2016-07-09 | Discharge: 2016-07-09 | Disposition: A | Discharge status: Home / Self Care | Payer: 59 | Source: Ambulatory Visit | Attending: Radiation Oncology | Admitting: Radiation Oncology

## 2016-07-09 DIAGNOSIS — C50412 Malignant neoplasm of upper-outer quadrant of left female breast: Secondary | ICD-10-CM | POA: Diagnosis not present

## 2016-07-10 ENCOUNTER — Ambulatory Visit
Admission: RE | Admit: 2016-07-10 | Discharge: 2016-07-10 | Disposition: A | Discharge status: Home / Self Care | Payer: 59 | Source: Ambulatory Visit | Attending: Radiation Oncology | Admitting: Radiation Oncology

## 2016-07-10 DIAGNOSIS — R5383 Other fatigue: Secondary | ICD-10-CM | POA: Diagnosis not present

## 2016-07-10 DIAGNOSIS — C773 Secondary and unspecified malignant neoplasm of axilla and upper limb lymph nodes: Secondary | ICD-10-CM | POA: Diagnosis not present

## 2016-07-10 DIAGNOSIS — R6882 Decreased libido: Secondary | ICD-10-CM | POA: Diagnosis not present

## 2016-07-10 DIAGNOSIS — C50412 Malignant neoplasm of upper-outer quadrant of left female breast: Secondary | ICD-10-CM | POA: Diagnosis not present

## 2016-07-10 DIAGNOSIS — Z5112 Encounter for antineoplastic immunotherapy: Secondary | ICD-10-CM | POA: Diagnosis not present

## 2016-07-11 ENCOUNTER — Ambulatory Visit
Admission: RE | Admit: 2016-07-11 | Discharge: 2016-07-11 | Disposition: A | Discharge status: Home / Self Care | Payer: 59 | Source: Ambulatory Visit | Attending: Radiation Oncology | Admitting: Radiation Oncology

## 2016-07-11 DIAGNOSIS — C50412 Malignant neoplasm of upper-outer quadrant of left female breast: Secondary | ICD-10-CM | POA: Diagnosis not present

## 2016-07-12 ENCOUNTER — Ambulatory Visit
Admission: RE | Admit: 2016-07-12 | Discharge: 2016-07-12 | Disposition: A | Discharge status: Home / Self Care | Payer: 59 | Source: Ambulatory Visit | Attending: Radiation Oncology | Admitting: Radiation Oncology

## 2016-07-12 DIAGNOSIS — C50412 Malignant neoplasm of upper-outer quadrant of left female breast: Secondary | ICD-10-CM | POA: Diagnosis not present

## 2016-07-15 ENCOUNTER — Ambulatory Visit
Admission: RE | Admit: 2016-07-15 | Discharge: 2016-07-15 | Disposition: A | Discharge status: Home / Self Care | Payer: 59 | Source: Ambulatory Visit | Attending: Radiation Oncology | Admitting: Radiation Oncology

## 2016-07-15 DIAGNOSIS — C50412 Malignant neoplasm of upper-outer quadrant of left female breast: Secondary | ICD-10-CM | POA: Diagnosis not present

## 2016-07-16 ENCOUNTER — Ambulatory Visit
Admission: RE | Admit: 2016-07-16 | Discharge: 2016-07-16 | Disposition: A | Discharge status: Home / Self Care | Payer: 59 | Source: Ambulatory Visit | Attending: Radiation Oncology | Admitting: Radiation Oncology

## 2016-07-16 DIAGNOSIS — C50412 Malignant neoplasm of upper-outer quadrant of left female breast: Secondary | ICD-10-CM | POA: Diagnosis not present

## 2016-07-17 ENCOUNTER — Other Ambulatory Visit: Payer: Self-pay | Admitting: Emergency Medicine

## 2016-07-17 ENCOUNTER — Ambulatory Visit
Admission: RE | Admit: 2016-07-17 | Discharge: 2016-07-17 | Disposition: A | Discharge status: Home / Self Care | Payer: 59 | Source: Ambulatory Visit | Attending: Radiation Oncology | Admitting: Radiation Oncology

## 2016-07-17 DIAGNOSIS — Z171 Estrogen receptor negative status [ER-]: Principal | ICD-10-CM

## 2016-07-17 DIAGNOSIS — C50412 Malignant neoplasm of upper-outer quadrant of left female breast: Secondary | ICD-10-CM | POA: Diagnosis not present

## 2016-07-17 NOTE — Assessment & Plan Note (Signed)
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 Adjuvant chemotherapy with TCHPX 6 cycles followed by Herceptin-Perjetamaintenance for one year 2. Followed by adjuvant radiation ---------------------------------------------------------------------------------------------------------------------------------------------------- Current treatment: Completed 6 cycles ofTCHP and today she receives maintenance herceptin and Perjeta  Patient will start XRT

## 2016-07-18 ENCOUNTER — Other Ambulatory Visit: Payer: Self-pay | Admitting: Hematology and Oncology

## 2016-07-18 ENCOUNTER — Encounter: Payer: Self-pay | Admitting: Hematology and Oncology

## 2016-07-18 ENCOUNTER — Encounter: Payer: Self-pay | Admitting: *Deleted

## 2016-07-18 ENCOUNTER — Telehealth (HOSPITAL_COMMUNITY): Payer: Self-pay | Admitting: Vascular Surgery

## 2016-07-18 ENCOUNTER — Ambulatory Visit (HOSPITAL_BASED_OUTPATIENT_CLINIC_OR_DEPARTMENT_OTHER): Payer: 59

## 2016-07-18 ENCOUNTER — Other Ambulatory Visit (HOSPITAL_BASED_OUTPATIENT_CLINIC_OR_DEPARTMENT_OTHER): Payer: 59

## 2016-07-18 ENCOUNTER — Ambulatory Visit
Admission: RE | Admit: 2016-07-18 | Discharge: 2016-07-18 | Disposition: A | Discharge status: Home / Self Care | Payer: 59 | Source: Ambulatory Visit | Attending: Radiation Oncology | Admitting: Radiation Oncology

## 2016-07-18 ENCOUNTER — Ambulatory Visit (HOSPITAL_BASED_OUTPATIENT_CLINIC_OR_DEPARTMENT_OTHER): Payer: 59 | Admitting: Hematology and Oncology

## 2016-07-18 DIAGNOSIS — C50412 Malignant neoplasm of upper-outer quadrant of left female breast: Secondary | ICD-10-CM

## 2016-07-18 DIAGNOSIS — C773 Secondary and unspecified malignant neoplasm of axilla and upper limb lymph nodes: Secondary | ICD-10-CM

## 2016-07-18 DIAGNOSIS — R5383 Other fatigue: Secondary | ICD-10-CM | POA: Diagnosis not present

## 2016-07-18 DIAGNOSIS — Z171 Estrogen receptor negative status [ER-]: Principal | ICD-10-CM

## 2016-07-18 DIAGNOSIS — R6882 Decreased libido: Secondary | ICD-10-CM | POA: Diagnosis not present

## 2016-07-18 DIAGNOSIS — Z5112 Encounter for antineoplastic immunotherapy: Secondary | ICD-10-CM | POA: Diagnosis not present

## 2016-07-18 LAB — CBC WITH DIFFERENTIAL/PLATELET
BASO%: 0.4 % (ref 0.0–2.0)
BASOS ABS: 0 10*3/uL (ref 0.0–0.1)
EOS ABS: 0.2 10*3/uL (ref 0.0–0.5)
EOS%: 4.1 % (ref 0.0–7.0)
HEMATOCRIT: 33.3 % — AB (ref 34.8–46.6)
HEMOGLOBIN: 10.3 g/dL — AB (ref 11.6–15.9)
LYMPH%: 30.8 % (ref 14.0–49.7)
MCH: 24.6 pg — AB (ref 25.1–34.0)
MCHC: 30.9 g/dL — ABNORMAL LOW (ref 31.5–36.0)
MCV: 79.7 fL (ref 79.5–101.0)
MONO#: 0.3 10*3/uL (ref 0.1–0.9)
MONO%: 6.9 % (ref 0.0–14.0)
NEUT%: 57.8 % (ref 38.4–76.8)
NEUTROS ABS: 2.7 10*3/uL (ref 1.5–6.5)
PLATELETS: 300 10*3/uL (ref 145–400)
RBC: 4.18 10*6/uL (ref 3.70–5.45)
RDW: 18.7 % — AB (ref 11.2–14.5)
WBC: 4.7 10*3/uL (ref 3.9–10.3)
lymph#: 1.4 10*3/uL (ref 0.9–3.3)

## 2016-07-18 LAB — COMPREHENSIVE METABOLIC PANEL
ALBUMIN: 3.7 g/dL (ref 3.5–5.0)
ALK PHOS: 103 U/L (ref 40–150)
ALT: 13 U/L (ref 0–55)
ANION GAP: 7 meq/L (ref 3–11)
AST: 27 U/L (ref 5–34)
BILIRUBIN TOTAL: 0.29 mg/dL (ref 0.20–1.20)
BUN: 13.3 mg/dL (ref 7.0–26.0)
CALCIUM: 9.4 mg/dL (ref 8.4–10.4)
CO2: 27 mEq/L (ref 22–29)
Chloride: 106 mEq/L (ref 98–109)
Creatinine: 1 mg/dL (ref 0.6–1.1)
EGFR: 89 mL/min/{1.73_m2} — AB (ref >90)
GLUCOSE: 97 mg/dL (ref 70–140)
POTASSIUM: 4.2 meq/L (ref 3.5–5.1)
Sodium: 140 mEq/L (ref 136–145)
TOTAL PROTEIN: 7.2 g/dL (ref 6.4–8.3)

## 2016-07-18 MED ORDER — ACETAMINOPHEN 325 MG PO TABS
ORAL_TABLET | ORAL | Status: AC
  Filled 2016-07-18: qty 2

## 2016-07-18 MED ORDER — SODIUM CHLORIDE 0.9 % IV SOLN
420.0000 mg | Freq: Once | INTRAVENOUS | Status: AC
  Administered 2016-07-18: 420 mg via INTRAVENOUS
  Filled 2016-07-18: qty 14

## 2016-07-18 MED ORDER — HEPARIN SOD (PORK) LOCK FLUSH 100 UNIT/ML IV SOLN
500.0000 [IU] | Freq: Once | INTRAVENOUS | Status: AC | PRN
  Administered 2016-07-18: 500 [IU]
  Filled 2016-07-18: qty 5

## 2016-07-18 MED ORDER — DIPHENHYDRAMINE HCL 25 MG PO CAPS
ORAL_CAPSULE | ORAL | Status: AC
  Filled 2016-07-18: qty 2

## 2016-07-18 MED ORDER — SODIUM CHLORIDE 0.9% FLUSH
10.0000 mL | INTRAVENOUS | Status: DC | PRN
  Administered 2016-07-18: 10 mL
  Filled 2016-07-18: qty 10

## 2016-07-18 MED ORDER — SODIUM CHLORIDE 0.9 % IV SOLN
Freq: Once | INTRAVENOUS | Status: AC
  Administered 2016-07-18: 11:00:00 via INTRAVENOUS

## 2016-07-18 MED ORDER — ACETAMINOPHEN 325 MG PO TABS
650.0000 mg | ORAL_TABLET | Freq: Once | ORAL | Status: AC
  Administered 2016-07-18: 650 mg via ORAL

## 2016-07-18 MED ORDER — DIPHENHYDRAMINE HCL 25 MG PO CAPS
50.0000 mg | ORAL_CAPSULE | Freq: Once | ORAL | Status: AC
Start: 2016-07-18 — End: 2016-07-18
  Administered 2016-07-18: 50 mg via ORAL

## 2016-07-18 MED ORDER — TRASTUZUMAB CHEMO 150 MG IV SOLR
6.0000 mg/kg | Freq: Once | INTRAVENOUS | Status: AC
  Administered 2016-07-18: 588 mg via INTRAVENOUS
  Filled 2016-07-18: qty 28

## 2016-07-18 NOTE — Progress Notes (Signed)
Patient Care Team: Jearld Fenton, NP as PCP - General (Internal Medicine) Servando Salina, MD as Consulting Physician (Obstetrics and Gynecology) Nicholas Lose, MD as Consulting Physician (Hematology and Oncology) Gardenia Phlegm, NP as Nurse Practitioner (Hematology and Oncology)  DIAGNOSIS:  Encounter Diagnosis  Name Primary?  . 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 She is tolerating the Herceptin and Perjeta maintenance fairly well. Mild nausea. Denies any fevers or chills. She continues to have fatigue issues from prior chemotherapy. Denies neuropathy  REVIEW OF SYSTEMS:   Constitutional: Denies fevers, chills or abnormal weight loss Eyes: Denies blurriness of vision Ears, nose, mouth, throat, and face: Denies mucositis or sore throat Respiratory: Denies cough, dyspnea or wheezes Cardiovascular: Denies palpitation, chest discomfort Gastrointestinal:  Denies nausea, heartburn or change in bowel  habits Skin: Denies abnormal skin rashes Lymphatics: Denies new lymphadenopathy or easy bruising Neurological:Denies numbness, tingling or new weaknesses Behavioral/Psych: Mood is stable, no new changes  Extremities: No lower extremity edema Breast:  denies any pain or lumps or nodules in either breasts All other systems were reviewed with the patient and are negative.  I have reviewed the past medical history, past surgical history, social history and family history with the patient and they are unchanged from previous note.  ALLERGIES:  is allergic to other; oxycodone; and peanuts [peanut oil].  MEDICATIONS:  Current Outpatient Prescriptions  Medication Sig Dispense Refill  . clotrimazole-betamethasone (LOTRISONE) cream Apply 1 application topically 2 (two) times daily. (Patient not taking: Reported on 06/24/2016) 30 g 0  . lactulose (CHRONULAC) 10 GM/15ML solution Take 15 mLs (10 g total) by mouth 3 (three) times daily. (Patient not taking: Reported on 06/24/2016) 240 mL 0  . lidocaine-prilocaine (EMLA) cream Apply to affected area once (Patient not taking: Reported on 06/24/2016) 30 g 3  . loratadine (CLARITIN) 10 MG tablet Take 10 mg by mouth daily.    . magnesium citrate SOLN Take 1 Bottle by mouth once.    . Prenatal Multivit-Min-Fe-FA (PRENATAL VITAMINS PO) Take 2 tablets by mouth.    . prochlorperazine (COMPAZINE) 10 MG tablet Take 1 tablet (10 mg total) by mouth every 6 (six) hours as needed (Nausea or vomiting). (Patient not taking: Reported on 06/20/2016) 30 tablet 1   No current facility-administered medications for this visit.     PHYSICAL EXAMINATION: ECOG PERFORMANCE STATUS: 1 - Symptomatic but completely ambulatory  Vitals:   07/18/16 0914  BP: 120/70  Pulse:  79  Resp: 18  Temp: 97.6 F (36.4 C)   Filed Weights   07/18/16 0914  Weight: 229 lb 1.6 oz (103.9 kg)    GENERAL:alert, no distress and comfortable SKIN: skin color, texture, turgor are normal, no rashes or  significant lesions EYES: normal, Conjunctiva are pink and non-injected, sclera clear OROPHARYNX:no exudate, no erythema and lips, buccal mucosa, and tongue normal  NECK: supple, thyroid normal size, non-tender, without nodularity LYMPH:  no palpable lymphadenopathy in the cervical, axillary or inguinal LUNGS: clear to auscultation and percussion with normal breathing effort HEART: regular rate & rhythm and no murmurs and no lower extremity edema ABDOMEN:abdomen soft, non-tender and normal bowel sounds MUSCULOSKELETAL:no cyanosis of digits and no clubbing  NEURO: alert & oriented x 3 with fluent speech, no focal motor/sensory deficits EXTREMITIES: No lower extremity edema  LABORATORY DATA:  I have reviewed the data as listed   Chemistry      Component Value Date/Time   NA 140 06/27/2016 0904   K 4.4 06/27/2016 0904   CL 98 (L) 02/29/2016 0148   CO2 26 06/27/2016 0904   BUN 9.3 06/27/2016 0904   CREATININE 0.9 06/27/2016 0904      Component Value Date/Time   CALCIUM 9.5 06/27/2016 0904   ALKPHOS 102 06/27/2016 0904   AST 23 06/27/2016 0904   ALT 16 06/27/2016 0904   BILITOT 0.33 06/27/2016 0904       Lab Results  Component Value Date   WBC 4.7 07/18/2016   HGB 10.3 (L) 07/18/2016   HCT 33.3 (L) 07/18/2016   MCV 79.7 07/18/2016   PLT 300 07/18/2016   NEUTROABS 2.7 07/18/2016    ASSESSMENT & PLAN:  Malignant neoplasm of upper-outer quadrant of left breast in female, estrogen receptor negative ([place]) 01/17/16: Left Lumpectomy: 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 Adjuvant chemotherapy with TCHPX 6 cycles followed by Herceptin-Perjetamaintenance for one year 2. Followed by adjuvant radiation ---------------------------------------------------------------------------------------------------------------------------------------------------- Current treatment: Completed 6 cycles  ofTCHP and today she receives maintenance herceptin and Perjeta Monitoring for chronic toxicities of chemotherapy Severe fatigue: Discussed with her about different exercises that she can do to improve the fatigue. Lack of sexual desire: I discussed with her that we are close to opening a clinical trial on sexual dysfunction after chemotherapy. Once we opened it we will evaluate if she is eligible to participate in it.  Patient is getting adjuvant radiation.  Patient will need echocardiograms every 3 months.  I spent 25 minutes talking to the patient of which more than half was spent in counseling and coordination of care.  No orders of the defined types were placed in this encounter.  The patient has a good understanding of the overall plan. she agrees with it. she will call with any problems that may develop before the next visit here.   Rulon Eisenmenger, MD 07/18/16

## 2016-07-18 NOTE — Telephone Encounter (Signed)
Left pt message to make brst  f/u appt w/ echo in [place]

## 2016-07-18 NOTE — Patient Instructions (Signed)
[facility] Discharge Instructions for Patients Receiving Chemotherapy  Today you received the following chemotherapy agents Herceptin/Perjeta To help prevent nausea and vomiting after your treatment, we encourage you to take your nausea medication as prescribed.   If you develop nausea and vomiting that is not controlled by your nausea medication, call the clinic.   BELOW ARE SYMPTOMS THAT SHOULD BE REPORTED IMMEDIATELY:  *FEVER GREATER THAN 100.5 F  *CHILLS WITH OR WITHOUT FEVER  NAUSEA AND VOMITING THAT IS NOT CONTROLLED WITH YOUR NAUSEA MEDICATION  *UNUSUAL SHORTNESS OF BREATH  *UNUSUAL BRUISING OR BLEEDING  TENDERNESS IN MOUTH AND THROAT WITH OR WITHOUT PRESENCE OF ULCERS  *URINARY PROBLEMS  *BOWEL PROBLEMS  UNUSUAL RASH Items with * indicate a potential emergency and should be followed up as soon as possible.  Feel free to call the clinic you have any questions or concerns. The clinic phone number is [phone].  Please show the CHEMO ALERT CARD at check-in to the Emergency Department and triage nurse.  

## 2016-07-19 ENCOUNTER — Ambulatory Visit
Admission: RE | Admit: 2016-07-19 | Discharge: 2016-07-19 | Disposition: A | Discharge status: Home / Self Care | Payer: 59 | Source: Ambulatory Visit | Attending: Radiation Oncology | Admitting: Radiation Oncology

## 2016-07-19 DIAGNOSIS — C50412 Malignant neoplasm of upper-outer quadrant of left female breast: Secondary | ICD-10-CM | POA: Diagnosis not present

## 2016-07-22 ENCOUNTER — Ambulatory Visit
Admission: RE | Admit: 2016-07-22 | Discharge: 2016-07-22 | Disposition: A | Discharge status: Home / Self Care | Payer: 59 | Source: Ambulatory Visit | Attending: Radiation Oncology | Admitting: Radiation Oncology

## 2016-07-22 DIAGNOSIS — C50412 Malignant neoplasm of upper-outer quadrant of left female breast: Secondary | ICD-10-CM | POA: Diagnosis not present

## 2016-07-23 ENCOUNTER — Ambulatory Visit
Admission: RE | Admit: 2016-07-23 | Discharge: 2016-07-23 | Disposition: A | Discharge status: Home / Self Care | Payer: 59 | Source: Ambulatory Visit | Attending: Radiation Oncology | Admitting: Radiation Oncology

## 2016-07-23 DIAGNOSIS — C50412 Malignant neoplasm of upper-outer quadrant of left female breast: Secondary | ICD-10-CM | POA: Diagnosis not present

## 2016-07-24 ENCOUNTER — Ambulatory Visit
Admission: RE | Admit: 2016-07-24 | Discharge: 2016-07-24 | Disposition: A | Discharge status: Home / Self Care | Payer: 59 | Source: Ambulatory Visit | Attending: Radiation Oncology | Admitting: Radiation Oncology

## 2016-07-24 DIAGNOSIS — C50412 Malignant neoplasm of upper-outer quadrant of left female breast: Secondary | ICD-10-CM | POA: Diagnosis not present

## 2016-07-25 ENCOUNTER — Ambulatory Visit
Admission: RE | Admit: 2016-07-25 | Discharge: 2016-07-25 | Disposition: A | Discharge status: Home / Self Care | Payer: 59 | Source: Ambulatory Visit | Attending: Radiation Oncology | Admitting: Radiation Oncology

## 2016-07-25 ENCOUNTER — Telehealth: Payer: Self-pay

## 2016-07-25 DIAGNOSIS — C50412 Malignant neoplasm of upper-outer quadrant of left female breast: Secondary | ICD-10-CM | POA: Diagnosis not present

## 2016-07-25 NOTE — Telephone Encounter (Signed)
Thank you for letting us know.  

## 2016-07-25 NOTE — Telephone Encounter (Signed)
Pt states she has fingernails lifting. She had spoken with Dr Lindi Adie when she had one nail lifting and he said it is not from chemo b/c she has already completed it. Last taxotere was given on 3/22. She is continuing with herceptin/perjeta. She now has 5 fingernails that are thin and whitish and lifting.   Discussed keeping nails short and clean, wearing rubber gloves for doing dishes, using bandaid or tape to keep them covered. Discussed calling [facility] if they start to appear infected - pussy, red, swollen, extra tender.   Pt was agreeable to this. She asked we let Dr Lindi Adie be aware of this.

## 2016-07-26 ENCOUNTER — Ambulatory Visit
Admission: RE | Admit: 2016-07-26 | Discharge: 2016-07-26 | Disposition: A | Discharge status: Home / Self Care | Payer: 59 | Source: Ambulatory Visit | Attending: Radiation Oncology | Admitting: Radiation Oncology

## 2016-07-26 DIAGNOSIS — C50412 Malignant neoplasm of upper-outer quadrant of left female breast: Secondary | ICD-10-CM | POA: Diagnosis not present

## 2016-07-29 ENCOUNTER — Ambulatory Visit
Admission: RE | Admit: 2016-07-29 | Discharge: 2016-07-29 | Disposition: A | Discharge status: Home / Self Care | Payer: 59 | Source: Ambulatory Visit | Attending: Radiation Oncology | Admitting: Radiation Oncology

## 2016-07-29 DIAGNOSIS — C50412 Malignant neoplasm of upper-outer quadrant of left female breast: Secondary | ICD-10-CM | POA: Diagnosis not present

## 2016-07-30 ENCOUNTER — Ambulatory Visit
Admission: RE | Admit: 2016-07-30 | Discharge: 2016-07-30 | Disposition: A | Discharge status: Home / Self Care | Payer: 59 | Source: Ambulatory Visit | Attending: Radiation Oncology | Admitting: Radiation Oncology

## 2016-07-30 DIAGNOSIS — C50412 Malignant neoplasm of upper-outer quadrant of left female breast: Secondary | ICD-10-CM | POA: Diagnosis not present

## 2016-07-31 ENCOUNTER — Ambulatory Visit
Admission: RE | Admit: 2016-07-31 | Discharge: 2016-07-31 | Disposition: A | Discharge status: Home / Self Care | Payer: 59 | Source: Ambulatory Visit | Attending: Radiation Oncology | Admitting: Radiation Oncology

## 2016-07-31 DIAGNOSIS — Z171 Estrogen receptor negative status [ER-]: Principal | ICD-10-CM

## 2016-07-31 DIAGNOSIS — C50412 Malignant neoplasm of upper-outer quadrant of left female breast: Secondary | ICD-10-CM

## 2016-07-31 MED ORDER — BIAFINE EX EMUL
CUTANEOUS | Status: DC | PRN
  Administered 2016-07-31: 11:00:00 via TOPICAL

## 2016-08-01 ENCOUNTER — Ambulatory Visit
Admission: RE | Admit: 2016-08-01 | Discharge: 2016-08-01 | Disposition: A | Discharge status: Home / Self Care | Payer: 59 | Source: Ambulatory Visit | Attending: Radiation Oncology | Admitting: Radiation Oncology

## 2016-08-01 DIAGNOSIS — C50412 Malignant neoplasm of upper-outer quadrant of left female breast: Secondary | ICD-10-CM | POA: Diagnosis not present

## 2016-08-02 ENCOUNTER — Ambulatory Visit
Admission: RE | Admit: 2016-08-02 | Discharge: 2016-08-02 | Disposition: A | Discharge status: Home / Self Care | Payer: 59 | Source: Ambulatory Visit | Attending: Radiation Oncology | Admitting: Radiation Oncology

## 2016-08-02 DIAGNOSIS — C50412 Malignant neoplasm of upper-outer quadrant of left female breast: Secondary | ICD-10-CM | POA: Diagnosis not present

## 2016-08-05 ENCOUNTER — Ambulatory Visit
Admission: RE | Admit: 2016-08-05 | Discharge: 2016-08-05 | Disposition: A | Discharge status: Home / Self Care | Payer: 59 | Source: Ambulatory Visit | Attending: Radiation Oncology | Admitting: Radiation Oncology

## 2016-08-05 ENCOUNTER — Ambulatory Visit: Payer: 59 | Admitting: Radiation Oncology

## 2016-08-05 VITALS — BP 113/72 | HR 88 | Temp 98.2°F | Ht 67.0 in | Wt 225.0 lb

## 2016-08-05 DIAGNOSIS — C50412 Malignant neoplasm of upper-outer quadrant of left female breast: Secondary | ICD-10-CM | POA: Diagnosis not present

## 2016-08-05 DIAGNOSIS — Z171 Estrogen receptor negative status [ER-]: Principal | ICD-10-CM

## 2016-08-05 NOTE — Progress Notes (Signed)
Brianna James presents for her 25th fraction of radiation to her Left Breast, Axilla. She has tenderness to her Left Breast. She denies fatigue. Her Left Breast is hyperpigmented. She reports itching to her Left Breast and is using hydrocortisone cream as needed. She is using Radiaplex twice daily as directed.   BP 113/72   Pulse 88   Temp 98.2 F (36.8 C)   Ht 5\' 7"  (1.702 m)   Wt 225 lb (102.1 kg)   SpO2 100% Comment: room air  BMI 35.24 kg/m    07/30/16 225.4 lb 08/05/16 225.0 lb

## 2016-08-06 ENCOUNTER — Ambulatory Visit
Admission: RE | Admit: 2016-08-06 | Discharge: 2016-08-06 | Disposition: A | Discharge status: Home / Self Care | Payer: 59 | Source: Ambulatory Visit | Attending: Radiation Oncology | Admitting: Radiation Oncology

## 2016-08-06 DIAGNOSIS — C50412 Malignant neoplasm of upper-outer quadrant of left female breast: Secondary | ICD-10-CM | POA: Diagnosis not present

## 2016-08-06 NOTE — Progress Notes (Signed)
   Weekly Management Note:  Outpatient    ICD-9-CM ICD-10-CM   1. Malignant neoplasm of upper-outer quadrant of left breast in female, estrogen receptor negative ([facility]) 174.4 C50.412    V86.1 Z17.1     Current Dose:  50 Gy  Projected Dose: 60 Gy   Narrative:  The patient presents for routine under treatment assessment.  CBCT/MVCT images/Port film x-rays were reviewed.  The chart was checked. Doing well, applies hydrocortisone 1% cream To itching areas.  Radiaplex throughout left breast / chest.  Physical Findings:  height is 5\' 7"  (1.702 m) and weight is 225 lb (102.1 kg). Her temperature is 98.2 F (36.8 C). Her blood pressure is 113/72 and her pulse is 88. Her oxygen saturation is 100%.   Wt Readings from Last 3 Encounters:  08/05/16 225 lb (102.1 kg)  07/18/16 229 lb 1.6 oz (103.9 kg)  06/24/16 229 lb 6.4 oz (104.1 kg)   NAD, left breast is hyperpigmented, skin intact.  Impression:  The patient is tolerating radiotherapy.  Plan:  Continue radiotherapy as planned. Patient instructed to apply Radiplex to intact skin in treatment fields. Continue hydrocortisone 1% cream for itching.  Apply neosporin prn peeling.      ________________________________   Eppie Gibson, M.D.

## 2016-08-07 ENCOUNTER — Ambulatory Visit
Admission: RE | Admit: 2016-08-07 | Discharge: 2016-08-07 | Disposition: A | Discharge status: Home / Self Care | Payer: 59 | Source: Ambulatory Visit | Attending: Radiation Oncology | Admitting: Radiation Oncology

## 2016-08-07 DIAGNOSIS — C50412 Malignant neoplasm of upper-outer quadrant of left female breast: Secondary | ICD-10-CM | POA: Diagnosis not present

## 2016-08-08 ENCOUNTER — Ambulatory Visit
Admission: RE | Admit: 2016-08-08 | Discharge: 2016-08-08 | Disposition: A | Discharge status: Home / Self Care | Payer: 59 | Source: Ambulatory Visit | Attending: Radiation Oncology | Admitting: Radiation Oncology

## 2016-08-08 ENCOUNTER — Ambulatory Visit (HOSPITAL_BASED_OUTPATIENT_CLINIC_OR_DEPARTMENT_OTHER): Payer: 59

## 2016-08-08 VITALS — BP 122/80 | HR 82 | Temp 98.0°F | Resp 16

## 2016-08-08 DIAGNOSIS — Z171 Estrogen receptor negative status [ER-]: Secondary | ICD-10-CM

## 2016-08-08 DIAGNOSIS — C50412 Malignant neoplasm of upper-outer quadrant of left female breast: Secondary | ICD-10-CM | POA: Diagnosis not present

## 2016-08-08 DIAGNOSIS — Z5112 Encounter for antineoplastic immunotherapy: Secondary | ICD-10-CM

## 2016-08-08 MED ORDER — ACETAMINOPHEN 325 MG PO TABS
ORAL_TABLET | ORAL | Status: AC
  Filled 2016-08-08: qty 2

## 2016-08-08 MED ORDER — ACETAMINOPHEN 325 MG PO TABS
650.0000 mg | ORAL_TABLET | Freq: Once | ORAL | Status: AC
  Administered 2016-08-08: 650 mg via ORAL

## 2016-08-08 MED ORDER — SODIUM CHLORIDE 0.9% FLUSH
10.0000 mL | INTRAVENOUS | Status: DC | PRN
  Administered 2016-08-08: 10 mL
  Filled 2016-08-08: qty 10

## 2016-08-08 MED ORDER — SODIUM CHLORIDE 0.9 % IV SOLN
420.0000 mg | Freq: Once | INTRAVENOUS | Status: AC
  Administered 2016-08-08: 420 mg via INTRAVENOUS
  Filled 2016-08-08: qty 14

## 2016-08-08 MED ORDER — SODIUM CHLORIDE 0.9 % IV SOLN
Freq: Once | INTRAVENOUS | Status: AC
  Administered 2016-08-08: 08:00:00 via INTRAVENOUS

## 2016-08-08 MED ORDER — HEPARIN SOD (PORK) LOCK FLUSH 100 UNIT/ML IV SOLN
500.0000 [IU] | Freq: Once | INTRAVENOUS | Status: AC | PRN
Start: 2016-08-08 — End: 2016-08-08
  Administered 2016-08-08: 500 [IU]
  Filled 2016-08-08: qty 5

## 2016-08-08 MED ORDER — DIPHENHYDRAMINE HCL 25 MG PO CAPS
ORAL_CAPSULE | ORAL | Status: AC
  Filled 2016-08-08: qty 2

## 2016-08-08 MED ORDER — DIPHENHYDRAMINE HCL 25 MG PO CAPS
50.0000 mg | ORAL_CAPSULE | Freq: Once | ORAL | Status: AC
  Administered 2016-08-08: 50 mg via ORAL

## 2016-08-08 MED ORDER — SODIUM CHLORIDE 0.9 % IV SOLN
600.0000 mg | Freq: Once | INTRAVENOUS | Status: AC
  Administered 2016-08-08: 600 mg via INTRAVENOUS
  Filled 2016-08-08: qty 28.57

## 2016-08-08 NOTE — Patient Instructions (Signed)
[facility] Discharge Instructions for Patients Receiving Chemotherapy  Today you received the following chemotherapy agents trastuzumab (Herceptin) and pertuzumab (Perjeta). To help prevent nausea and vomiting after your treatment, we encourage you to take your nausea medication as prescribed.   If you develop nausea and vomiting that is not controlled by your nausea medication, call the clinic.   BELOW ARE SYMPTOMS THAT SHOULD BE REPORTED IMMEDIATELY:  *FEVER GREATER THAN 100.5 F  *CHILLS WITH OR WITHOUT FEVER  NAUSEA AND VOMITING THAT IS NOT CONTROLLED WITH YOUR NAUSEA MEDICATION  *UNUSUAL SHORTNESS OF BREATH  *UNUSUAL BRUISING OR BLEEDING  TENDERNESS IN MOUTH AND THROAT WITH OR WITHOUT PRESENCE OF ULCERS  *URINARY PROBLEMS  *BOWEL PROBLEMS  UNUSUAL RASH Items with * indicate a potential emergency and should be followed up as soon as possible.  Feel free to call the clinic you have any questions or concerns. The clinic phone number is [phone].  Please show the [place] at check-in to the Emergency Department and triage nurse.

## 2016-08-09 ENCOUNTER — Ambulatory Visit
Admission: RE | Admit: 2016-08-09 | Discharge: 2016-08-09 | Disposition: A | Discharge status: Home / Self Care | Payer: 59 | Source: Ambulatory Visit | Attending: Radiation Oncology | Admitting: Radiation Oncology

## 2016-08-09 DIAGNOSIS — C50412 Malignant neoplasm of upper-outer quadrant of left female breast: Secondary | ICD-10-CM | POA: Diagnosis not present

## 2016-08-13 ENCOUNTER — Telehealth: Payer: Self-pay | Admitting: *Deleted

## 2016-08-13 ENCOUNTER — Encounter: Payer: Self-pay | Admitting: Radiation Oncology

## 2016-08-13 ENCOUNTER — Ambulatory Visit
Admission: RE | Admit: 2016-08-13 | Discharge: 2016-08-13 | Disposition: A | Discharge status: Home / Self Care | Payer: 59 | Source: Ambulatory Visit | Attending: Radiation Oncology | Admitting: Radiation Oncology

## 2016-08-13 VITALS — BP 110/72 | HR 89 | Temp 98.4°F | Resp 18 | Wt 226.8 lb

## 2016-08-13 DIAGNOSIS — Z171 Estrogen receptor negative status [ER-]: Principal | ICD-10-CM

## 2016-08-13 DIAGNOSIS — C50412 Malignant neoplasm of upper-outer quadrant of left female breast: Secondary | ICD-10-CM | POA: Diagnosis not present

## 2016-08-13 NOTE — Progress Notes (Signed)
Brianna James completed her final 30th treatment to left breast.  Patient denies having any pain.  Patient reports having some mild fatigue.  Patient appetite is good.  Patient reports using radiaplex/biafine/neosporin.  Skin to left breast is hyperpigmentation and peeling under left breast.    Patient given 1 month follow up appointment.    Vitals:   08/13/16 1057  BP: 110/72  Pulse: 89  Resp: 18  Temp: 98.4 F (36.9 C)  TempSrc: Oral  SpO2: 100%  Weight: 226 lb 12.8 oz (102.9 kg)   Wt Readings from Last 3 Encounters:  08/13/16 226 lb 12.8 oz (102.9 kg)  08/05/16 225 lb (102.1 kg)  07/18/16 229 lb 1.6 oz (103.9 kg)

## 2016-08-13 NOTE — Progress Notes (Signed)
  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 Left Axilla/SCV treated to 45 Gy in 25 fractions.  Beams/energy:   Left Breast: 3D  //  10X        Boost: 3D  //  10X, 6X        Left Axilla/SCV: 3D  //  10X, 6X  Narrative: The patient tolerated radiation treatment relatively well. She denied pain throughout treatment. The patient reported experiencing mild fatigue. She experienced some radiation related skin changes including patchy dryness I the left axilla, as well as small patches of hypo and hyperpigmentation.  Plan: The patient has completed radiation treatment. She will continue to apply Radiaplex or Biafine to intact skin within the treatment fields, and to use hydrocortisone 1% cream prn for itching. The patient will return to radiation oncology clinic for routine followup in one month. I advised them to call or return sooner if they have any questions or concerns related to their recovery or treatment.  -----------------------------------  Eppie Gibson, MD  This document serves as a record of services personally performed by Eppie Gibson, MD. It was created on her behalf by Maryla Morrow, a trained medical scribe. The creation of this record is based on the scribe's personal observations and the provider's statements to them. This document has been checked and approved by the attending provider.

## 2016-08-13 NOTE — Telephone Encounter (Signed)
  Oncology Nurse Navigator Documentation  Navigator Location: [facility] (08/13/16 1400)   )Navigator Encounter Type: Telephone (08/13/16 1400) Telephone: [place] Call (08/13/16 1400)                   Patient Visit Type: HALPFX (08/13/16 1400) Treatment Phase: Final Radiation Tx (08/13/16 1400)                            Time Spent with Patient: 15 (08/13/16 1400)

## 2016-08-13 NOTE — Progress Notes (Signed)
   Weekly Management Note:  Outpatient    ICD-9-CM ICD-10-CM   1. Malignant neoplasm of upper-outer quadrant of left breast in female, estrogen receptor negative ([facility]) 174.4 C50.412    V86.1 Z17.1     Current Dose:  60 Gy  Projected Dose: 60 Gy   Narrative:  The patient presents for routine under treatment assessment accompanied by family.  CBCT/MVCT images/Port film x-rays were reviewed.  The chart was checked.   The patient completed her final treatment today. She denies pain. She reports mild fatigue. The patient reports a good appetite. She is using Radiaplex, Biafine, and Neosporin as directed.  Physical Findings:  weight is 226 lb 12.8 oz (102.9 kg). Her oral temperature is 98.4 F (36.9 C). Her blood pressure is 110/72 and her pulse is 89. Her respiration is 18 and oxygen saturation is 100%.   Wt Readings from Last 3 Encounters:  08/13/16 226 lb 12.8 oz (102.9 kg)  08/05/16 225 lb (102.1 kg)  07/18/16 229 lb 1.6 oz (103.9 kg)   Skin intact, some patches of dryness in the left axilla with hyper and hypopigmentation related to treatment.  Impression:  The patient has tolerated radiotherapy well.  Plan:  Patient completed radiation therapy. Follow up in 1 month. Patient instructed to apply Radiplex to intact skin in treatment fields, or biafine. Continue hydrocortisone 1% cream prn for itching. When the patient runs out of Radiaplex or biafine, she may switch to Vitamin E oil or cream.  ________________________________   Eppie Gibson, M.D.   This document serves as a record of services personally performed by Eppie Gibson, MD. It was created on her behalf by Maryla Morrow, a trained medical scribe. The creation of this record is based on the scribe's personal observations and the provider's statements to them. This document has been checked and approved by the attending provider.

## 2016-08-14 ENCOUNTER — Ambulatory Visit: Payer: 59

## 2016-08-15 ENCOUNTER — Ambulatory Visit: Payer: 59

## 2016-08-16 ENCOUNTER — Ambulatory Visit: Payer: 59

## 2016-08-19 ENCOUNTER — Ambulatory Visit: Payer: 59

## 2016-08-20 ENCOUNTER — Ambulatory Visit: Payer: 59

## 2016-08-21 ENCOUNTER — Ambulatory Visit: Payer: 59

## 2016-08-22 ENCOUNTER — Ambulatory Visit: Payer: 59

## 2016-08-23 ENCOUNTER — Ambulatory Visit: Payer: 59

## 2016-08-26 ENCOUNTER — Ambulatory Visit: Payer: 59

## 2016-08-27 ENCOUNTER — Ambulatory Visit: Payer: 59

## 2016-08-28 ENCOUNTER — Other Ambulatory Visit: Payer: Self-pay

## 2016-08-28 DIAGNOSIS — C50412 Malignant neoplasm of upper-outer quadrant of left female breast: Secondary | ICD-10-CM

## 2016-08-28 DIAGNOSIS — Z171 Estrogen receptor negative status [ER-]: Principal | ICD-10-CM

## 2016-08-29 ENCOUNTER — Ambulatory Visit (HOSPITAL_BASED_OUTPATIENT_CLINIC_OR_DEPARTMENT_OTHER): Payer: 59 | Admitting: Hematology and Oncology

## 2016-08-29 ENCOUNTER — Encounter: Payer: Self-pay | Admitting: Hematology and Oncology

## 2016-08-29 ENCOUNTER — Ambulatory Visit (HOSPITAL_BASED_OUTPATIENT_CLINIC_OR_DEPARTMENT_OTHER): Payer: 59

## 2016-08-29 ENCOUNTER — Other Ambulatory Visit (HOSPITAL_BASED_OUTPATIENT_CLINIC_OR_DEPARTMENT_OTHER): Payer: 59

## 2016-08-29 VITALS — BP 102/64 | HR 67 | Temp 98.3°F | Resp 18

## 2016-08-29 DIAGNOSIS — C50412 Malignant neoplasm of upper-outer quadrant of left female breast: Secondary | ICD-10-CM

## 2016-08-29 DIAGNOSIS — Z171 Estrogen receptor negative status [ER-]: Principal | ICD-10-CM

## 2016-08-29 DIAGNOSIS — Z5112 Encounter for antineoplastic immunotherapy: Secondary | ICD-10-CM | POA: Diagnosis not present

## 2016-08-29 DIAGNOSIS — R5383 Other fatigue: Secondary | ICD-10-CM | POA: Diagnosis not present

## 2016-08-29 DIAGNOSIS — C773 Secondary and unspecified malignant neoplasm of axilla and upper limb lymph nodes: Secondary | ICD-10-CM | POA: Diagnosis not present

## 2016-08-29 LAB — CBC WITH DIFFERENTIAL/PLATELET
BASO%: 0.2 % (ref 0.0–2.0)
Basophils Absolute: 0 10*3/uL (ref 0.0–0.1)
EOS%: 2.7 % (ref 0.0–7.0)
Eosinophils Absolute: 0.2 10*3/uL (ref 0.0–0.5)
HCT: 34.4 % — ABNORMAL LOW (ref 34.8–46.6)
HGB: 10.8 g/dL — ABNORMAL LOW (ref 11.6–15.9)
LYMPH%: 26.2 % (ref 14.0–49.7)
MCH: 24.7 pg — AB (ref 25.1–34.0)
MCHC: 31.4 g/dL — AB (ref 31.5–36.0)
MCV: 78.5 fL — ABNORMAL LOW (ref 79.5–101.0)
MONO#: 0.4 10*3/uL (ref 0.1–0.9)
MONO%: 6.8 % (ref 0.0–14.0)
NEUT#: 3.8 10*3/uL (ref 1.5–6.5)
NEUT%: 64.1 % (ref 38.4–76.8)
Platelets: 189 10*3/uL (ref 145–400)
RBC: 4.38 10*6/uL (ref 3.70–5.45)
RDW: 16.7 % — ABNORMAL HIGH (ref 11.2–14.5)
WBC: 5.9 10*3/uL (ref 3.9–10.3)
lymph#: 1.6 10*3/uL (ref 0.9–3.3)

## 2016-08-29 LAB — COMPREHENSIVE METABOLIC PANEL
ALT: 8 U/L (ref 0–55)
ANION GAP: 7 meq/L (ref 3–11)
AST: 18 U/L (ref 5–34)
Albumin: 3.4 g/dL — ABNORMAL LOW (ref 3.5–5.0)
Alkaline Phosphatase: 78 U/L (ref 40–150)
BUN: 11.2 mg/dL (ref 7.0–26.0)
CHLORIDE: 104 meq/L (ref 98–109)
CO2: 29 mEq/L (ref 22–29)
CREATININE: 1 mg/dL (ref 0.6–1.1)
Calcium: 9.5 mg/dL (ref 8.4–10.4)
EGFR: 80 mL/min/{1.73_m2} — ABNORMAL LOW (ref >90)
Glucose: 92 mg/dl (ref 70–140)
POTASSIUM: 4.5 meq/L (ref 3.5–5.1)
Sodium: 140 mEq/L (ref 136–145)
Total Bilirubin: 0.33 mg/dL (ref 0.20–1.20)
Total Protein: 7 g/dL (ref 6.4–8.3)

## 2016-08-29 MED ORDER — ACETAMINOPHEN 325 MG PO TABS
650.0000 mg | ORAL_TABLET | Freq: Once | ORAL | Status: AC
  Administered 2016-08-29: 650 mg via ORAL

## 2016-08-29 MED ORDER — PERTUZUMAB CHEMO INJECTION 420 MG/14ML
420.0000 mg | Freq: Once | INTRAVENOUS | Status: AC
  Administered 2016-08-29: 420 mg via INTRAVENOUS
  Filled 2016-08-29: qty 14

## 2016-08-29 MED ORDER — HEPARIN SOD (PORK) LOCK FLUSH 100 UNIT/ML IV SOLN
500.0000 [IU] | Freq: Once | INTRAVENOUS | Status: AC | PRN
  Administered 2016-08-29: 500 [IU]
  Filled 2016-08-29: qty 5

## 2016-08-29 MED ORDER — DIPHENHYDRAMINE HCL 25 MG PO CAPS
50.0000 mg | ORAL_CAPSULE | Freq: Once | ORAL | Status: AC
  Administered 2016-08-29: 50 mg via ORAL

## 2016-08-29 MED ORDER — DIPHENHYDRAMINE HCL 25 MG PO CAPS
ORAL_CAPSULE | ORAL | Status: AC
  Filled 2016-08-29: qty 2

## 2016-08-29 MED ORDER — ACETAMINOPHEN 325 MG PO TABS
ORAL_TABLET | ORAL | Status: AC
  Filled 2016-08-29: qty 2

## 2016-08-29 MED ORDER — TRASTUZUMAB CHEMO 150 MG IV SOLR
600.0000 mg | Freq: Once | INTRAVENOUS | Status: AC
  Administered 2016-08-29: 600 mg via INTRAVENOUS
  Filled 2016-08-29: qty 28.57

## 2016-08-29 MED ORDER — SODIUM CHLORIDE 0.9% FLUSH
10.0000 mL | INTRAVENOUS | Status: DC | PRN
  Administered 2016-08-29: 10 mL
  Filled 2016-08-29: qty 10

## 2016-08-29 MED ORDER — SODIUM CHLORIDE 0.9 % IV SOLN
Freq: Once | INTRAVENOUS | Status: AC
  Administered 2016-08-29: 09:00:00 via INTRAVENOUS

## 2016-08-29 NOTE — Progress Notes (Signed)
Pt tolerated infusion well. Pt monitored 30 minutes post infusion. Pt and VS stable at discharge.  

## 2016-08-29 NOTE — Patient Instructions (Signed)
[facility] Discharge Instructions for Patients Receiving Chemotherapy  Today you received the following chemotherapy agents Herceptin and Perjeta   To help prevent nausea and vomiting after your treatment, we encourage you to take your nausea medication as directed.    If you develop nausea and vomiting that is not controlled by your nausea medication, call the clinic.   BELOW ARE SYMPTOMS THAT SHOULD BE REPORTED IMMEDIATELY:  *FEVER GREATER THAN 100.5 F  *CHILLS WITH OR WITHOUT FEVER  NAUSEA AND VOMITING THAT IS NOT CONTROLLED WITH YOUR NAUSEA MEDICATION  *UNUSUAL SHORTNESS OF BREATH  *UNUSUAL BRUISING OR BLEEDING  TENDERNESS IN MOUTH AND THROAT WITH OR WITHOUT PRESENCE OF ULCERS  *URINARY PROBLEMS  *BOWEL PROBLEMS  UNUSUAL RASH Items with * indicate a potential emergency and should be followed up as soon as possible.  Feel free to call the clinic you have any questions or concerns. The clinic phone number is [phone].  Please show the CHEMO ALERT CARD at check-in to the Emergency Department and triage nurse.   

## 2016-08-29 NOTE — Assessment & Plan Note (Signed)
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 Adjuvant chemotherapy with TCHPX 6 cycles followed by Herceptin-Perjetamaintenance for one year 2. Followed by adjuvant radiation completed 08/13/2016 ---------------------------------------------------------------------------------------------------------------------------------------------------- Current treatment: Completed 6 cycles ofTCHP and today she receives maintenance herceptin and Perjeta  Monitoring for chronic toxicities of chemotherapy 1. Severe fatigue: Discussed with her about different exercises that she can do to improve the fatigue. 2. Lack of sexual desire: Counseled Adjuvant radiation completed 08/13/2016  Patient will need echocardiograms every 3 months. Return to clinic every 3 weeks for Herceptin Perjeta every 6 weeks for follow-up with me

## 2016-08-29 NOTE — Progress Notes (Signed)
Patient Care Team: Jearld Fenton, NP as PCP - General (Internal Medicine) Servando Salina, MD as Consulting Physician (Obstetrics and Gynecology) Nicholas Lose, MD as Consulting Physician (Hematology and Oncology) Delice Bison Charlestine Massed, NP as Nurse Practitioner (Hematology and Oncology)  DIAGNOSIS:  Encounter Diagnosis  Name Primary?  . 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 Overall she's been tolerating the treatment fairly well but she has long-standing side effects from prior chemotherapy including fatigue and lack of sexual desire. Fatigue is slowly improving. She denies neuropathy. Denies any nausea vomiting. Denies any diarrhea.  REVIEW OF SYSTEMS:   Constitutional: Denies fevers, chills or abnormal weight loss Eyes: Denies blurriness of vision Ears, nose, mouth, throat, and face: Denies mucositis or sore throat Respiratory: Denies  cough, dyspnea or wheezes Cardiovascular: Denies palpitation, chest discomfort Gastrointestinal:  Denies nausea, heartburn or change in bowel habits Skin: Denies abnormal skin rashes Lymphatics: Denies new lymphadenopathy or easy bruising Neurological:Denies numbness, tingling or new weaknesses Behavioral/Psych: Mood is stable, no new changes  Extremities: No lower extremity edema Breast:  denies any pain or lumps or nodules in either breasts All other systems were reviewed with the patient and are negative.  I have reviewed the past medical history, past surgical history, social history and family history with the patient and they are unchanged from previous note.  ALLERGIES:  is allergic to other; oxycodone; and peanuts [peanut oil].  MEDICATIONS:  Current Outpatient Prescriptions  Medication Sig Dispense Refill  . clotrimazole-betamethasone (LOTRISONE) cream Apply 1 application topically 2 (two) times daily. (Patient not taking: Reported on 08/13/2016) 30 g 0  . emollient (BIAFINE) cream Apply topically as needed.    . loratadine (CLARITIN) 10 MG tablet Take 10 mg by mouth daily.    . magnesium citrate SOLN Take 1 Bottle by mouth once.    . Prenatal Multivit-Min-Fe-FA (PRENATAL VITAMINS PO) Take 2 tablets by mouth.    . prochlorperazine (COMPAZINE) 10 MG tablet Take 1 tablet (10 mg total) by mouth every 6 (six) hours as needed (Nausea or vomiting). (Patient not taking: Reported on 06/20/2016) 30 tablet 1   No current facility-administered medications for this visit.     PHYSICAL EXAMINATION: ECOG PERFORMANCE STATUS: 1 - Symptomatic but completely ambulatory  There were no vitals filed for this visit. There were no vitals filed for this visit.  GENERAL:alert, no distress and comfortable  SKIN: skin color, texture, turgor are normal, no rashes or significant lesions EYES: normal, Conjunctiva are pink and non-injected, sclera clear OROPHARYNX:no exudate, no erythema and lips, buccal  mucosa, and tongue normal  NECK: supple, thyroid normal size, non-tender, without nodularity LYMPH:  no palpable lymphadenopathy in the cervical, axillary or inguinal LUNGS: clear to auscultation and percussion with normal breathing effort HEART: regular rate & rhythm and no murmurs and no lower extremity edema ABDOMEN:abdomen soft, non-tender and normal bowel sounds MUSCULOSKELETAL:no cyanosis of digits and no clubbing  NEURO: alert & oriented x 3 with fluent speech, no focal motor/sensory deficits EXTREMITIES: No lower extremity edema   LABORATORY DATA:  I have reviewed the data as listed   Chemistry      Component Value Date/Time   NA 140 07/18/2016 0852   K 4.2 07/18/2016 0852   CL 98 (L) 02/29/2016 0148   CO2 27 07/18/2016 0852   BUN 13.3 07/18/2016 0852   CREATININE 1.0 07/18/2016 0852      Component Value Date/Time   CALCIUM 9.4 07/18/2016 0852   ALKPHOS 103 07/18/2016 0852   AST 27 07/18/2016 0852   ALT 13 07/18/2016 0852   BILITOT 0.29 07/18/2016 0852       Lab Results  Component Value Date   WBC 4.7 07/18/2016   HGB 10.3 (L) 07/18/2016   HCT 33.3 (L) 07/18/2016   MCV 79.7 07/18/2016   PLT 300 07/18/2016   NEUTROABS 2.7 07/18/2016    ASSESSMENT & PLAN:  Malignant neoplasm of upper-outer quadrant of left breast in female, estrogen receptor negative ([place]) 01/17/16: Left Lumpectomy: 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 Adjuvant chemotherapy with TCHPX 6 cycles followed by Herceptin-Perjetamaintenance for one year 2. Followed by adjuvant radiation completed 08/13/2016 ---------------------------------------------------------------------------------------------------------------------------------------------------- Current treatment: Completed 6 cycles ofTCHP and today she receives maintenance herceptin and Perjeta  Monitoring for chronic toxicities of  chemotherapy 1. Severe fatigue: Discussed with her about different exercises that she can do to improve the fatigue. 2. Lack of sexual desire: Counseled Adjuvant radiation completed 08/13/2016  Patient will need echocardiograms every 3 months. Return to clinic every 3 weeks for Herceptin Perjeta every 6 weeks for follow-up with me   I spent 25 minutes talking to the patient of which more than half was spent in counseling and coordination of care.  No orders of the defined types were placed in this encounter.  The patient has a good understanding of the overall plan. she agrees with it. she will call with any problems that may develop before the next visit here.   Rulon Eisenmenger, MD 08/29/16

## 2016-08-30 ENCOUNTER — Encounter (HOSPITAL_COMMUNITY): Payer: Self-pay

## 2016-09-05 ENCOUNTER — Encounter: Payer: Self-pay | Admitting: Radiation Oncology

## 2016-09-13 ENCOUNTER — Ambulatory Visit
Admission: RE | Admit: 2016-09-13 | Discharge: 2016-09-13 | Disposition: A | Discharge status: Home / Self Care | Payer: 59 | Source: Ambulatory Visit | Attending: Radiation Oncology | Admitting: Radiation Oncology

## 2016-09-13 ENCOUNTER — Encounter: Payer: Self-pay | Admitting: Radiation Oncology

## 2016-09-13 VITALS — BP 113/70 | HR 95 | Temp 98.1°F | Resp 18 | Ht 67.0 in | Wt 219.6 lb

## 2016-09-13 DIAGNOSIS — Z171 Estrogen receptor negative status [ER-]: Principal | ICD-10-CM

## 2016-09-13 DIAGNOSIS — C50412 Malignant neoplasm of upper-outer quadrant of left female breast: Secondary | ICD-10-CM

## 2016-09-13 HISTORY — DX: Personal history of irradiation: Z92.3

## 2016-09-13 NOTE — Progress Notes (Signed)
Radiation Oncology         [phone] ________________________________  Name: Brianna James MRN: [id number]  Date: 09/13/2016  DOB: 06-28-1980  Follow-Up Visit Note  Outpatient  CC: Jearld Fenton, NP  Nicholas Lose, MD  Diagnosis and Prior Radiotherapy:    ICD-10-CM   1. 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 Left SCV/Axilla treated to 45 Gy in 25 fractions.  CHIEF COMPLAINT: Here for follow-up and surveillance of Left Breast cancer  Narrative:  The patient returns today for routine follow-up of radiation completed 08/13/16.  On review of systems, the patient denies pain, though she notes mild soreness to the left breast. She reports some fatigue, and she takes naps as needed. She can raise her left arm without difficulty, and denies lymphedema concerns at this time. She reports a good appetite and eats 3 meals a day. She continues to use Biafine cream to the skin within the treatment fields.     The patient is scheduled to follow up with Dr. Lindi Adie on 10/10/16. She continues to receive Herceptin/Perjeta every 3 weeks. The patient is not currently scheduled for a Survivorship appointment.  ALLERGIES:  is allergic to other; oxycodone; and peanuts [peanut oil].  Meds: Current Outpatient Prescriptions  Medication Sig Dispense Refill  . clotrimazole-betamethasone (LOTRISONE) cream Apply 1 application topically 2 (two) times daily. 30 g 0  . emollient (BIAFINE) cream Apply topically as needed.    . loratadine (CLARITIN) 10 MG tablet Take 10 mg by mouth daily.    . magnesium citrate SOLN Take 1 Bottle by mouth once.    . Prenatal  Multivit-Min-Fe-FA (PRENATAL VITAMINS PO) Take 2 tablets by mouth.    . prochlorperazine (COMPAZINE) 10 MG tablet Take 1 tablet (10 mg total) by mouth every 6 (six) hours as needed (Nausea or vomiting). (Patient not taking: Reported on 06/20/2016) 30 tablet 1   No current facility-administered medications for this encounter.    REVIEW OF SYSTEMS: As above.  Physical Findings: The patient is in no acute distress. Patient is alert and oriented.  height is '5\' 7"'  (1.702 m) and weight is 219 lb 9.6 oz (99.6 kg). Her oral temperature is 98.1 F (36.7 C). Her blood pressure is 113/70 and her pulse is 95. Her respiration is 18 and oxygen saturation is 98%.   Skin has healed very well. She has faint patches of hyperpigmentation.   Lab Findings: Lab Results  Component Value Date   WBC 5.9 08/29/2016   HGB 10.8 (L) 08/29/2016   HCT 34.4 (L) 08/29/2016   MCV 78.5 (L) 08/29/2016   PLT 189 08/29/2016    Radiographic Findings: No results found.  Impression/Plan:  Healing well on clinical exam today.  The patient may begin to use Vitamin E cream to the skin within the treatment fields for the next 2-3 months. The patient will continue to follow with Dr. Lindi Adie as directed. She should continue with regular yearly mammograms and self breast exams. The patient will follow up with me as needed. I encouraged the patient to contact or return to the clinic with any questions or concerns that may arise.  I spent  25 minutes face to face with the patient and more than 50% of that time was spent in counseling and/or coordination of care. _____________________________________   Eppie Gibson, MD  This document serves as a record of services personally performed by Eppie Gibson, MD. It was created on her behalf by Maryla Morrow, a trained medical scribe. The creation of this record is based on the scribe's personal observations and the provider's statements to them. This document has been checked and approved  by the attending provider.

## 2016-09-13 NOTE — Progress Notes (Addendum)
Brianna James 36 y.o. yong lady with Malignant neoplasm of upper-outer quadrant of left breast in female, estrogen receptor negative radiation completed 08-13-16, one month FU.   Skin status:Left breast withmild tanning. What lotion are you using? Still using biafine cream. Have you seen med onc? If not, when is appointment: 08-29-16 Dr Lindi Adie If they are ER+, have they started Al or Tamoxifen? If not, why? N/A Negative ER PR Discuss survivorship appointment: Not scheduled Have you had a mammogram scheduled? Not scheduled yet Offer referral to [facility]. Will receive at the survivorship appointment:: Appetite:Good eating three meals a day. Pain:None Fatigue:Having fatigue taking naps. Arm mobility:Raising left arm without difficulty. Lymphedema:No 08-29-16 Saw Dr. Lindi Adie every 3 weeks for Herceptin Perjeta maintenance for one year every 6 weeks for follow-up with me, next visit 10-10-16 Weight: Wt Readings from Last 3 Encounters:  09/13/16 219 lb 9.6 oz (99.6 kg)  08/29/16 229 lb 3.2 oz (104 kg)  08/13/16 226 lb 12.8 oz (102.9 kg)  BP 113/70   Pulse 95   Temp 98.1 F (36.7 C) (Oral)   Resp 18   Ht 5\' 7"  (1.702 m)   Wt 219 lb 9.6 oz (99.6 kg)   LMP 02/13/2016   SpO2 98%   BMI 34.39 kg/m

## 2016-09-19 ENCOUNTER — Encounter: Payer: Self-pay | Admitting: *Deleted

## 2016-09-19 ENCOUNTER — Ambulatory Visit (HOSPITAL_BASED_OUTPATIENT_CLINIC_OR_DEPARTMENT_OTHER): Payer: 59

## 2016-09-19 VITALS — BP 119/71 | HR 75 | Temp 98.4°F | Resp 18

## 2016-09-19 DIAGNOSIS — C773 Secondary and unspecified malignant neoplasm of axilla and upper limb lymph nodes: Secondary | ICD-10-CM

## 2016-09-19 DIAGNOSIS — Z5112 Encounter for antineoplastic immunotherapy: Secondary | ICD-10-CM

## 2016-09-19 DIAGNOSIS — Z171 Estrogen receptor negative status [ER-]: Principal | ICD-10-CM

## 2016-09-19 DIAGNOSIS — C50412 Malignant neoplasm of upper-outer quadrant of left female breast: Secondary | ICD-10-CM | POA: Diagnosis not present

## 2016-09-19 MED ORDER — ACETAMINOPHEN 325 MG PO TABS
ORAL_TABLET | ORAL | Status: AC
  Filled 2016-09-19: qty 2

## 2016-09-19 MED ORDER — DIPHENHYDRAMINE HCL 25 MG PO CAPS
ORAL_CAPSULE | ORAL | Status: AC
Start: 2016-09-19 — End: 2016-09-19
  Filled 2016-09-19: qty 2

## 2016-09-19 MED ORDER — HEPARIN SOD (PORK) LOCK FLUSH 100 UNIT/ML IV SOLN
500.0000 [IU] | Freq: Once | INTRAVENOUS | Status: AC | PRN
Start: 2016-09-19 — End: 2016-09-19
  Administered 2016-09-19: 500 [IU]
  Filled 2016-09-19: qty 5

## 2016-09-19 MED ORDER — ACETAMINOPHEN 325 MG PO TABS
650.0000 mg | ORAL_TABLET | Freq: Once | ORAL | Status: AC
  Administered 2016-09-19: 650 mg via ORAL

## 2016-09-19 MED ORDER — SODIUM CHLORIDE 0.9 % IV SOLN
Freq: Once | INTRAVENOUS | Status: AC
  Administered 2016-09-19: 09:00:00 via INTRAVENOUS

## 2016-09-19 MED ORDER — TRASTUZUMAB CHEMO 150 MG IV SOLR
600.0000 mg | Freq: Once | INTRAVENOUS | Status: AC
  Administered 2016-09-19: 600 mg via INTRAVENOUS
  Filled 2016-09-19: qty 28.57

## 2016-09-19 MED ORDER — DIPHENHYDRAMINE HCL 25 MG PO CAPS
50.0000 mg | ORAL_CAPSULE | Freq: Once | ORAL | Status: AC
  Administered 2016-09-19: 50 mg via ORAL

## 2016-09-19 MED ORDER — SODIUM CHLORIDE 0.9% FLUSH
10.0000 mL | INTRAVENOUS | Status: DC | PRN
  Administered 2016-09-19: 10 mL
  Filled 2016-09-19: qty 10

## 2016-09-19 MED ORDER — SODIUM CHLORIDE 0.9 % IV SOLN
420.0000 mg | Freq: Once | INTRAVENOUS | Status: AC
  Administered 2016-09-19: 420 mg via INTRAVENOUS
  Filled 2016-09-19: qty 14

## 2016-09-19 NOTE — Patient Instructions (Signed)
[facility] Discharge Instructions for Patients Receiving Chemotherapy  Today you received the following chemotherapy agents Herceptin and Perjeta   To help prevent nausea and vomiting after your treatment, we encourage you to take your nausea medication as directed.    If you develop nausea and vomiting that is not controlled by your nausea medication, call the clinic.   BELOW ARE SYMPTOMS THAT SHOULD BE REPORTED IMMEDIATELY:  *FEVER GREATER THAN 100.5 F  *CHILLS WITH OR WITHOUT FEVER  NAUSEA AND VOMITING THAT IS NOT CONTROLLED WITH YOUR NAUSEA MEDICATION  *UNUSUAL SHORTNESS OF BREATH  *UNUSUAL BRUISING OR BLEEDING  TENDERNESS IN MOUTH AND THROAT WITH OR WITHOUT PRESENCE OF ULCERS  *URINARY PROBLEMS  *BOWEL PROBLEMS  UNUSUAL RASH Items with * indicate a potential emergency and should be followed up as soon as possible.  Feel free to call the clinic you have any questions or concerns. The clinic phone number is [phone].  Please show the CHEMO ALERT CARD at check-in to the Emergency Department and triage nurse.   

## 2016-09-25 ENCOUNTER — Ambulatory Visit (HOSPITAL_COMMUNITY)
Admission: RE | Admit: 2016-09-25 | Discharge: 2016-09-25 | Disposition: A | Discharge status: Home / Self Care | Payer: 59 | Source: Ambulatory Visit | Attending: Internal Medicine | Admitting: Internal Medicine

## 2016-09-25 ENCOUNTER — Encounter (HOSPITAL_COMMUNITY): Payer: Self-pay | Admitting: Internal Medicine

## 2016-09-25 ENCOUNTER — Ambulatory Visit (HOSPITAL_BASED_OUTPATIENT_CLINIC_OR_DEPARTMENT_OTHER)
Admission: RE | Admit: 2016-09-25 | Discharge: 2016-09-25 | Disposition: A | Discharge status: Home / Self Care | Payer: 59 | Source: Ambulatory Visit | Attending: Internal Medicine | Admitting: Internal Medicine

## 2016-09-25 VITALS — BP 114/74 | HR 72 | Wt 225.2 lb

## 2016-09-25 DIAGNOSIS — Z171 Estrogen receptor negative status [ER-]: Secondary | ICD-10-CM | POA: Diagnosis not present

## 2016-09-25 DIAGNOSIS — Z801 Family history of malignant neoplasm of trachea, bronchus and lung: Secondary | ICD-10-CM | POA: Insufficient documentation

## 2016-09-25 DIAGNOSIS — Z803 Family history of malignant neoplasm of breast: Secondary | ICD-10-CM | POA: Insufficient documentation

## 2016-09-25 DIAGNOSIS — Z79899 Other long term (current) drug therapy: Secondary | ICD-10-CM | POA: Diagnosis not present

## 2016-09-25 DIAGNOSIS — Z8261 Family history of arthritis: Secondary | ICD-10-CM | POA: Insufficient documentation

## 2016-09-25 DIAGNOSIS — E669 Obesity, unspecified: Secondary | ICD-10-CM | POA: Diagnosis not present

## 2016-09-25 DIAGNOSIS — Z9221 Personal history of antineoplastic chemotherapy: Secondary | ICD-10-CM | POA: Diagnosis not present

## 2016-09-25 DIAGNOSIS — C50412 Malignant neoplasm of upper-outer quadrant of left female breast: Secondary | ICD-10-CM | POA: Diagnosis not present

## 2016-09-25 DIAGNOSIS — Z923 Personal history of irradiation: Secondary | ICD-10-CM | POA: Diagnosis not present

## 2016-09-25 DIAGNOSIS — Z823 Family history of stroke: Secondary | ICD-10-CM | POA: Insufficient documentation

## 2016-09-25 DIAGNOSIS — Z888 Allergy status to other drugs, medicaments and biological substances status: Secondary | ICD-10-CM | POA: Diagnosis not present

## 2016-09-25 DIAGNOSIS — Z1501 Genetic susceptibility to malignant neoplasm of breast: Secondary | ICD-10-CM | POA: Insufficient documentation

## 2016-09-25 DIAGNOSIS — Z808 Family history of malignant neoplasm of other organs or systems: Secondary | ICD-10-CM | POA: Diagnosis not present

## 2016-09-25 LAB — ECHOCARDIOGRAM COMPLETE
E decel time: 229 msec
EERAT: 7.16
FS: 32 % (ref 28–44)
IVS/LV PW RATIO, ED: 1.03
LA ID, A-P, ES: 36 mm
LA diam end sys: 36 mm
LA diam index: 1.71 cm/m2
LAVOL: 56.5 mL
LAVOLA4C: 60 mL
LAVOLIN: 26.9 mL/m2
LDCA: 3.14 cm2
LV E/e'average: 7.16
LV PW d: 6.78 mm — AB (ref 0.6–1.1)
LV TDI E'MEDIAL: 11.1
LVEEMED: 7.16
LVELAT: 13 cm/s
LVOT VTI: 22.6 cm
LVOT diameter: 20 mm
LVOTPV: 103 cm/s
LVOTSV: 71 mL
Lateral S' vel: 11 cm/s
MV Dec: 229
MV Peak grad: 3 mmHg
MV pk A vel: 54.3 m/s
MV pk E vel: 93.1 m/s
TAPSE: 26.2 mm
TDI e' lateral: 13

## 2016-09-25 NOTE — Patient Instructions (Signed)
We will contact you in 3 months to schedule your next appointment and echocardiogram  

## 2016-09-25 NOTE — Addendum Note (Signed)
Encounter addended by: Scarlette Calico, RN on: 09/25/2016 11:00 AM<BR>    Actions taken: Order list changed, Diagnosis association updated

## 2016-09-25 NOTE — Progress Notes (Signed)
CARDIO-ONCOLOGY CLINIC  NOTE  Referring Physician: Lindi Adie   HPI: 36 y/o woman with h/o obesity and left breast cancer (ER/PR negative, HER-2 +) diagnosed 10/17 referred to Cardio-Oncology Clinic by Dr. Lindi Adie.   Denies any h/o known cardiac disease.   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 She completed radiation therapy on 08/13/16. Will continue Herceptin-Perjeta until Nov. 2018. Overall feeling ok, feels fatigued. Walking for exercise, tires to jog some at night when its not too hot. 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 Denies chest pain, SOB, palpitations. Taking all medications. Currently doing a 21 day cleanse, using D Herbs (ingredients are mostly herbs) and eating mostly fruit and veggies. Has lost about 10 pounds.    Echo 02/20/16: EF 60-65% Lateral s' 12.1 cm/s. GLS -20.8% Echo 06/20/16 Personally reviewed 55-60% Lateral s' 10.6 GLS -18.5% Echo 09/25/16: Personally reviewed EF 60%  EF  Lateral s' 10.6  GLS -18.8  Past Medical History:  Diagnosis Date  . Cancer ([place]) 01/2016   breast  . Chicken pox   . Hematoma    left breast  . History of radiation therapy 07/02/16- 08/13/16   Left Breast 50 Gy in 25 fractions,  Left Breast Boost 10 Gy in 5 fractions, Left Axilla 45 Gy in 25 fractions.   Marland Kitchen HSV infection   . PONV (postoperative nausea and vomiting)     Current Outpatient Prescriptions  Medication Sig Dispense Refill  . Prenatal Multivit-Min-Fe-FA (PRENATAL VITAMINS PO) Take 2 tablets by mouth.     No current facility-administered medications for this encounter.     Allergies  Allergen Reactions  . Other Swelling    Magic Mouthwash w/ lidocaine  . Oxycodone Nausea And Vomiting    Pt reports adverse reaction.   . Peanuts [Peanut Oil] Hives      Social History   Social History  . Marital status: Married    Spouse name: N/A  . Number of children: N/A  . Years of education: N/A   Occupational History  . Not on file.   Social History Main Topics  . Smoking status: Never Smoker  . Smokeless tobacco: Never Used  . Alcohol use 0.0 oz/week     Comment: rare - wine maybe 2x per yr  . Drug use: No  . Sexual activity: Yes    Birth control/ protection: None   Other Topics Concern  . Not on file   Social History Narrative  . No narrative on file      Family History  Problem Relation Age of Onset  . Arthritis Mother   . Stroke Mother   . Stroke Maternal Aunt   .  Stroke Maternal Uncle   . Breast cancer Maternal Grandmother        dx at 46 or younger; d. 62y  . Lung cancer Maternal Grandfather   . Stroke Paternal Grandmother   . Uterine cancer Paternal Grandmother        dx. 61-62  . Other Paternal Grandmother        colostomy bag in her 75s; unspecified reason  . Lung cancer Paternal Grandfather   . Uterine cancer Maternal Aunt        dx unspecified age  . Breast cancer Other        maternal great grandmother (MGM's mother) d. 44    Vitals:   09/25/16 1004  BP: 114/74  Pulse: 72  SpO2: 100%  Weight: 225 lb 4 oz (102.2 kg)    PHYSICAL EXAM: General: Well appearing female, NAD.  HEENT: Atraumatic  Neck: supple. No JVD.  Carotids 2+ bilat; no bruits. No  lymphadenopathy or thryomegaly appreciated. Cor: PMI non displaced, regular rate and rhythm. No rubs, gallops or murmurs. R-sided port Lungs: Clear bilaterally. Normal effort.  Abdomen: Obese, soft, non tender, non distended. No hepatosplenomegaly. No bruits or masses. Good bowel sounds. Extremities: no cyanosis, clubbing, rash. No peripheral edema.  Neuro: alert & orientedx3, cranial nerves grossly intact. moves all 4 extremities w/o difficulty. Affect pleasant   ASSESSMENT & PLAN:  1. 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 2018.  - Completed adjuvant radiation in May 2018.  - Echo stable continue Herceptin   Arbutus Leas, NP-C  10:15 AM   Patient seen and examined with Jettie Booze, NP. We discussed all aspects of the encounter. I agree with the assessment and plan as stated above.   I reviewed echos personally. EF and Doppler parameters stable. No HF on exam. Continue Herceptin.   Glori Bickers, MD  10:49 AM

## 2016-09-25 NOTE — Progress Notes (Signed)
  Echocardiogram 2D Echocardiogram has been performed.  Brianna James 09/25/2016, 10:04 AM

## 2016-10-10 ENCOUNTER — Encounter: Payer: Self-pay | Admitting: Hematology and Oncology

## 2016-10-10 ENCOUNTER — Ambulatory Visit (HOSPITAL_BASED_OUTPATIENT_CLINIC_OR_DEPARTMENT_OTHER): Payer: 59 | Admitting: Hematology and Oncology

## 2016-10-10 ENCOUNTER — Other Ambulatory Visit (HOSPITAL_BASED_OUTPATIENT_CLINIC_OR_DEPARTMENT_OTHER): Payer: 59

## 2016-10-10 ENCOUNTER — Ambulatory Visit (HOSPITAL_BASED_OUTPATIENT_CLINIC_OR_DEPARTMENT_OTHER): Payer: 59

## 2016-10-10 DIAGNOSIS — C773 Secondary and unspecified malignant neoplasm of axilla and upper limb lymph nodes: Secondary | ICD-10-CM

## 2016-10-10 DIAGNOSIS — C50412 Malignant neoplasm of upper-outer quadrant of left female breast: Secondary | ICD-10-CM

## 2016-10-10 DIAGNOSIS — R5383 Other fatigue: Secondary | ICD-10-CM | POA: Diagnosis not present

## 2016-10-10 DIAGNOSIS — Z171 Estrogen receptor negative status [ER-]: Principal | ICD-10-CM

## 2016-10-10 DIAGNOSIS — R6882 Decreased libido: Secondary | ICD-10-CM

## 2016-10-10 DIAGNOSIS — Z5112 Encounter for antineoplastic immunotherapy: Secondary | ICD-10-CM | POA: Diagnosis not present

## 2016-10-10 LAB — CBC WITH DIFFERENTIAL/PLATELET
BASO%: 0.1 % (ref 0.0–2.0)
BASOS ABS: 0 10*3/uL (ref 0.0–0.1)
EOS%: 5.3 % (ref 0.0–7.0)
Eosinophils Absolute: 0.4 10*3/uL (ref 0.0–0.5)
HCT: 34.2 % — ABNORMAL LOW (ref 34.8–46.6)
HGB: 10.9 g/dL — ABNORMAL LOW (ref 11.6–15.9)
LYMPH%: 23.4 % (ref 14.0–49.7)
MCH: 24.2 pg — AB (ref 25.1–34.0)
MCHC: 31.9 g/dL (ref 31.5–36.0)
MCV: 76 fL — AB (ref 79.5–101.0)
MONO#: 0.4 10*3/uL (ref 0.1–0.9)
MONO%: 5.2 % (ref 0.0–14.0)
NEUT#: 4.4 10*3/uL (ref 1.5–6.5)
NEUT%: 66 % (ref 38.4–76.8)
Platelets: 228 10*3/uL (ref 145–400)
RBC: 4.5 10*6/uL (ref 3.70–5.45)
RDW: 16.9 % — AB (ref 11.2–14.5)
WBC: 6.7 10*3/uL (ref 3.9–10.3)
lymph#: 1.6 10*3/uL (ref 0.9–3.3)

## 2016-10-10 LAB — COMPREHENSIVE METABOLIC PANEL
ALK PHOS: 98 U/L (ref 40–150)
ALT: 9 U/L (ref 0–55)
AST: 19 U/L (ref 5–34)
Albumin: 3.4 g/dL — ABNORMAL LOW (ref 3.5–5.0)
Anion Gap: 7 mEq/L (ref 3–11)
BUN: 5.8 mg/dL — AB (ref 7.0–26.0)
CHLORIDE: 104 meq/L (ref 98–109)
CO2: 26 mEq/L (ref 22–29)
Calcium: 9.1 mg/dL (ref 8.4–10.4)
Creatinine: 0.9 mg/dL (ref 0.6–1.1)
GLUCOSE: 96 mg/dL (ref 70–140)
POTASSIUM: 4.3 meq/L (ref 3.5–5.1)
SODIUM: 138 meq/L (ref 136–145)
Total Bilirubin: 0.29 mg/dL (ref 0.20–1.20)
Total Protein: 7.2 g/dL (ref 6.4–8.3)

## 2016-10-10 MED ORDER — ACETAMINOPHEN 325 MG PO TABS
650.0000 mg | ORAL_TABLET | Freq: Once | ORAL | Status: AC
  Administered 2016-10-10: 650 mg via ORAL

## 2016-10-10 MED ORDER — TRASTUZUMAB CHEMO 150 MG IV SOLR
600.0000 mg | Freq: Once | INTRAVENOUS | Status: AC
  Administered 2016-10-10: 600 mg via INTRAVENOUS
  Filled 2016-10-10: qty 28.57

## 2016-10-10 MED ORDER — HEPARIN SOD (PORK) LOCK FLUSH 100 UNIT/ML IV SOLN
500.0000 [IU] | Freq: Once | INTRAVENOUS | Status: AC | PRN
  Administered 2016-10-10: 500 [IU]
  Filled 2016-10-10: qty 5

## 2016-10-10 MED ORDER — SODIUM CHLORIDE 0.9 % IV SOLN
Freq: Once | INTRAVENOUS | Status: AC
  Administered 2016-10-10: 10:00:00 via INTRAVENOUS

## 2016-10-10 MED ORDER — ACETAMINOPHEN 325 MG PO TABS
ORAL_TABLET | ORAL | Status: AC
  Filled 2016-10-10: qty 2

## 2016-10-10 MED ORDER — SODIUM CHLORIDE 0.9% FLUSH
10.0000 mL | INTRAVENOUS | Status: DC | PRN
  Administered 2016-10-10: 10 mL
  Filled 2016-10-10: qty 10

## 2016-10-10 MED ORDER — DIPHENHYDRAMINE HCL 25 MG PO CAPS
ORAL_CAPSULE | ORAL | Status: AC
  Filled 2016-10-10: qty 2

## 2016-10-10 MED ORDER — DIPHENHYDRAMINE HCL 25 MG PO CAPS
50.0000 mg | ORAL_CAPSULE | Freq: Once | ORAL | Status: AC
  Administered 2016-10-10: 50 mg via ORAL

## 2016-10-10 MED ORDER — SODIUM CHLORIDE 0.9 % IV SOLN
420.0000 mg | Freq: Once | INTRAVENOUS | Status: AC
  Administered 2016-10-10: 420 mg via INTRAVENOUS
  Filled 2016-10-10: qty 14

## 2016-10-10 NOTE — Patient Instructions (Signed)
[facility] Discharge Instructions for Patients Receiving Chemotherapy  Today you received the following chemotherapy agents Herceptin and Perjeta   To help prevent nausea and vomiting after your treatment, we encourage you to take your nausea medication as directed.    If you develop nausea and vomiting that is not controlled by your nausea medication, call the clinic.   BELOW ARE SYMPTOMS THAT SHOULD BE REPORTED IMMEDIATELY:  *FEVER GREATER THAN 100.5 F  *CHILLS WITH OR WITHOUT FEVER  NAUSEA AND VOMITING THAT IS NOT CONTROLLED WITH YOUR NAUSEA MEDICATION  *UNUSUAL SHORTNESS OF BREATH  *UNUSUAL BRUISING OR BLEEDING  TENDERNESS IN MOUTH AND THROAT WITH OR WITHOUT PRESENCE OF ULCERS  *URINARY PROBLEMS  *BOWEL PROBLEMS  UNUSUAL RASH Items with * indicate a potential emergency and should be followed up as soon as possible.  Feel free to call the clinic you have any questions or concerns. The clinic phone number is [phone].  Please show the CHEMO ALERT CARD at check-in to the Emergency Department and triage nurse.   

## 2016-10-10 NOTE — Assessment & Plan Note (Addendum)
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 Adjuvant chemotherapy with TCHPX 6 cycles followed by Herceptin-Perjetamaintenance for one year started 02/22/2016 2. Followed by adjuvant radiation completed 08/13/2016 ---------------------------------------------------------------------------------------------------------------------------------------------------- Current treatment: Completed 6 cycles ofTCHP and today she receives maintenance herceptin and Perjeta  Monitoring for chronic toxicities of chemotherapy 1. Severe fatigue: Discussed with her about different exercises that she can do to improve the fatigue. 2. Lack of sexual desire: Counseled Adjuvant radiation completed 08/13/2016  Patient will need echocardiograms every 3 months. PERSEPHONE clinical trial has shown that 6 months of Herceptin is a: 212 months of therapy. Based on this clinical trial, I recommended stopping anti-HER-2 maintenance therapy at this time. Port will be removed  Return to clinic in 6 months for follow-up

## 2016-10-10 NOTE — Progress Notes (Signed)
Patient Care Team: Jearld Fenton, NP as PCP - General (Internal Medicine) Servando Salina, MD as Consulting Physician (Obstetrics and Gynecology) Nicholas Lose, MD as Consulting Physician (Hematology and Oncology) Delice Bison Charlestine Massed, NP as Nurse Practitioner (Hematology and Oncology)  DIAGNOSIS:  Encounter Diagnosis  Name Primary?  . Malignant neoplasm of upper-outer quadrant of left breast in female, estrogen receptor negative ([place])     SUMMARY OF ONCOLOGIC HISTORY:   Malignant neoplasm of upper-outer quadrant of left breast in female, estrogen receptor negative ([place])   12/19/2015 Initial Diagnosis    Left breast biopsy: IDC with DCI S, grade 3, ER 0%, PR 0%, KI67 40%, her 2 positive ratio 6.15, copy number 16.5; palpable lump left breast 1.6 cm irregular oval mass, T1cN0 stage I a clinical stage      01/17/2016 Surgery    Left Lumpectomy: IDC 4.1 cm with DCIS, Margin 0.1 cm, 1/3 LN Positive, ER/PR Neg, Her 2 Pos with Ki 67 of 40%, T2N1 (stage 2B)      02/22/2016 -  Chemotherapy    Adjuvant chemotherapy with TCH Perjeta 6 cycles followed by Herceptin Perjeta maintenance       07/02/2016 - 08/13/2016 Radiation Therapy    Adjuvant radiation therapy       CHIEF COMPLIANT: Herceptin Perjeta maintenance  INTERVAL HISTORY: Brianna James is a 36 year old with above-mentioned history of left breast cancer currently on adjuvant Herceptin and Perjeta. She appears to be tolerating it fairly well. She is doing a cleanse of some sort and has diarrhea as result of that. She tends that she may be getting upper respiratory infection today. Denies any fevers or chills.  REVIEW OF SYSTEMS:   Constitutional: Denies fevers, chills or abnormal weight loss Eyes: Denies blurriness of vision Ears, nose, mouth, throat, and face: Denies mucositis or sore throat Respiratory: Denies cough, dyspnea or wheezes Cardiovascular: Denies palpitation, chest discomfort Gastrointestinal:   Denies nausea, heartburn or change in bowel habits Skin: Denies abnormal skin rashes Lymphatics: Denies new lymphadenopathy or easy bruising Neurological:Denies numbness, tingling or new weaknesses Behavioral/Psych: Mood is stable, no new changes  Extremities: No lower extremity edema Breast: Slight tenderness in the left breast All other systems were reviewed with the patient and are negative.  I have reviewed the past medical history, past surgical history, social history and family history with the patient and they are unchanged from previous note.  ALLERGIES:  is allergic to other; oxycodone; and peanuts [peanut oil].  MEDICATIONS:  Current Outpatient Prescriptions  Medication Sig Dispense Refill  . Prenatal Multivit-Min-Fe-FA (PRENATAL VITAMINS PO) Take 2 tablets by mouth.     No current facility-administered medications for this visit.     PHYSICAL EXAMINATION: ECOG PERFORMANCE STATUS: 1 - Symptomatic but completely ambulatory  Vitals:   10/10/16 0927  BP: 111/65  Pulse: 78  Resp: 16  Temp: 98.6 F (37 C)   Filed Weights   10/10/16 0927  Weight: 223 lb 6.4 oz (101.3 kg)    GENERAL:alert, no distress and comfortable SKIN: skin color, texture, turgor are normal, no rashes or significant lesions EYES: normal, Conjunctiva are pink and non-injected, sclera clear OROPHARYNX:no exudate, no erythema and lips, buccal mucosa, and tongue normal  NECK: supple, thyroid normal size, non-tender, without nodularity LYMPH:  no palpable lymphadenopathy in the cervical, axillary or inguinal LUNGS: clear to auscultation and percussion with normal breathing effort HEART: regular rate & rhythm and no murmurs and no lower extremity edema ABDOMEN:abdomen soft, non-tender and normal bowel sounds  MUSCULOSKELETAL:no cyanosis of digits and no clubbing  NEURO: alert & oriented x 3 with fluent speech, no focal motor/sensory deficits EXTREMITIES: No lower extremity edema   LABORATORY DATA:    I have reviewed the data as listed   Chemistry      Component Value Date/Time   NA 140 08/29/2016 0805   K 4.5 08/29/2016 0805   CL 98 (L) 02/29/2016 0148   CO2 29 08/29/2016 0805   BUN 11.2 08/29/2016 0805   CREATININE 1.0 08/29/2016 0805      Component Value Date/Time   CALCIUM 9.5 08/29/2016 0805   ALKPHOS 78 08/29/2016 0805   AST 18 08/29/2016 0805   ALT 8 08/29/2016 0805   BILITOT 0.33 08/29/2016 0805       Lab Results  Component Value Date   WBC 6.7 10/10/2016   HGB 10.9 (L) 10/10/2016   HCT 34.2 (L) 10/10/2016   MCV 76.0 (L) 10/10/2016   PLT 228 10/10/2016   NEUTROABS 4.4 10/10/2016    ASSESSMENT & PLAN:  Malignant neoplasm of upper-outer quadrant of left breast in female, estrogen receptor negative ([place]) 01/17/16: Left Lumpectomy: 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 Adjuvant chemotherapy with TCHPX 6 cycles followed by Herceptin-Perjetamaintenance for one year started 02/22/2016 2. Followed by adjuvant radiation completed 08/13/2016 ---------------------------------------------------------------------------------------------------------------------------------------------------- Current treatment: Completed 6 cycles ofTCHP and today she receives maintenance herceptin and Perjeta  Monitoring for chronic toxicities of chemotherapy 1. Severe fatigue: Discussed with her about different exercises that she can do to improve the fatigue. 2. Lack of sexual desire: Counseled Adjuvant radiation completed 08/13/2016  Patient will need echocardiograms every 3 months. PERSEPHONE clinical trial has shown that 6 months of Herceptin is a: 212 months of therapy. Based on this clinical trial,We could stop or anti-HER-2 therapy at any time. She wants to think about it and discuss with her husband.   If she stops treatment, [place] will be removed We also discussed the role of  Neratinib. I provided her with literature on that drug.  Surveillance: Patient will be due for mammograms. This will hopefully evaluate the cause for breast tenderness. Return to clinic in 6 weeks for follow-up to make a final decision.   I spent 25 minutes talking to the patient of which more than half was spent in counseling and coordination of care.  No orders of the defined types were placed in this encounter.  The patient has a good understanding of the overall plan. she agrees with it. she will call with any problems that may develop before the next visit here.   Rulon Eisenmenger, MD 10/10/16

## 2016-10-25 ENCOUNTER — Ambulatory Visit (INDEPENDENT_AMBULATORY_CARE_PROVIDER_SITE_OTHER): Payer: 59 | Admitting: Family Medicine

## 2016-10-25 ENCOUNTER — Encounter: Payer: Self-pay | Admitting: Family Medicine

## 2016-10-25 VITALS — BP 90/60 | HR 96 | Temp 98.7°F | Ht 67.0 in | Wt 226.2 lb

## 2016-10-25 DIAGNOSIS — R21 Rash and other nonspecific skin eruption: Secondary | ICD-10-CM | POA: Diagnosis not present

## 2016-10-25 MED ORDER — TRIAMCINOLONE ACETONIDE 0.5 % EX CREA
1.0000 | TOPICAL_CREAM | Freq: Two times a day (BID) | CUTANEOUS | 0 refills | Status: DC
Start: 2016-10-25 — End: 2017-01-14

## 2016-10-25 NOTE — Patient Instructions (Signed)
Apply topical steroid to affected area twice daily x 2 week.  Start daily claritin or other  Non drowsy antihistamine. If not improving.. Discus with oncologist to determine if it could be from [place] or Herceptin.

## 2016-10-25 NOTE — Assessment & Plan Note (Signed)
Most likely contact dermatitis given history. Less likely SE to chemo.  Will start topical steroid to avoid decreasing immune system with oral steroid.

## 2016-10-25 NOTE — Progress Notes (Signed)
   Subjective:    Patient ID: Brianna James, female    DOB: May 29, 1980, 36 y.o.   MRN: [id number]  HPI   36 year old female pt of regiuna BAity's presents with new onset rash on upper body.  She reports she noted new rash in last 5 days.. On chest, arms. Rash is itchy, raised, red.  She feels well otherwise.Marland Kitchen No fever, no flu like symptoms.   Took benadryl, tried hydrocortisone.Marland Kitchen Helped some. Not spreading.  No new exposure.. Was on recent road trip.. Did note after she used different soap in  Gas station. Touched face.. Started itching  There then rash spread  No  New med.   No new lotions., no new foods.   She is currently being treated for breast cancer by ONC. Complete lumpectomy, chemo and radiation. Now on herceptin, perjeta. Dr. Lindi Adie   Has eczema in past as child. Review of Systems  Constitutional: Negative for fatigue and fever.  HENT: Negative for ear pain.   Eyes: Negative for pain.  Respiratory: Negative for chest tightness and shortness of breath.   Cardiovascular: Negative for chest pain, palpitations and leg swelling.  Gastrointestinal: Negative for abdominal pain.  Genitourinary: Negative for dysuria.       Objective:   Physical Exam  Constitutional: Vital signs are normal. She appears well-developed and well-nourished. She is cooperative.  Non-toxic appearance. She does not appear ill. No distress.  HENT:  Head: Normocephalic.  Right Ear: Hearing, tympanic membrane, external ear and ear canal normal. Tympanic membrane is not erythematous, not retracted and not bulging.  Left Ear: Hearing, tympanic membrane, external ear and ear canal normal. Tympanic membrane is not erythematous, not retracted and not bulging.  Nose: No mucosal edema or rhinorrhea. Right sinus exhibits no maxillary sinus tenderness and no frontal sinus tenderness. Left sinus exhibits no maxillary sinus tenderness and no frontal sinus tenderness.  Mouth/Throat: Uvula is midline, oropharynx  is clear and moist and mucous membranes are normal.  Eyes: Pupils are equal, round, and reactive to light. Conjunctivae, EOM and lids are normal. Lids are everted and swept, no foreign bodies found.  Neck: Trachea normal and normal range of motion. Neck supple. Carotid bruit is not present. No thyroid mass and no thyromegaly present.  Cardiovascular: Normal rate, regular rhythm, S1 normal, S2 normal, normal heart sounds, intact distal pulses and normal pulses.  Exam reveals no gallop and no friction rub.   No murmur heard. Pulmonary/Chest: Effort normal and breath sounds normal. No tachypnea. No respiratory distress. She has no decreased breath sounds. She has no wheezes. She has no rhonchi. She has no rales.  Abdominal: Soft. Normal appearance and bowel sounds are normal. There is no tenderness.  Neurological: She is alert.  Skin: Skin is warm, dry and intact. No rash noted.  Erythematous papules and dry patches on chest, right face and upper arma and back  Psychiatric: Her speech is normal and behavior is normal. Judgment and thought content normal. Her mood appears not anxious. Cognition and memory are normal. She does not exhibit a depressed mood.          Assessment & Plan:

## 2016-10-28 ENCOUNTER — Telehealth: Payer: Self-pay

## 2016-10-28 NOTE — Telephone Encounter (Signed)
Pt called about her fingernails lifting and fluid coming from under her nails.  LVM will call again in the morning.

## 2016-10-29 ENCOUNTER — Ambulatory Visit (INDEPENDENT_AMBULATORY_CARE_PROVIDER_SITE_OTHER): Payer: 59 | Admitting: Internal Medicine

## 2016-10-29 VITALS — BP 108/66 | HR 86 | Temp 98.3°F | Wt 225.5 lb

## 2016-10-29 DIAGNOSIS — R300 Dysuria: Secondary | ICD-10-CM

## 2016-10-29 DIAGNOSIS — R339 Retention of urine, unspecified: Secondary | ICD-10-CM | POA: Diagnosis not present

## 2016-10-29 DIAGNOSIS — R350 Frequency of micturition: Secondary | ICD-10-CM

## 2016-10-29 DIAGNOSIS — L03039 Cellulitis of unspecified toe: Secondary | ICD-10-CM | POA: Diagnosis not present

## 2016-10-29 LAB — POC URINALSYSI DIPSTICK (AUTOMATED)
BILIRUBIN UA: NEGATIVE
Blood, UA: NEGATIVE
Glucose, UA: NEGATIVE
Ketones, UA: NEGATIVE
LEUKOCYTES UA: NEGATIVE
NITRITE UA: NEGATIVE
PH UA: 6.5 (ref 5.0–8.0)
Protein, UA: NEGATIVE
Spec Grav, UA: 1.02 (ref 1.010–1.025)
UROBILINOGEN UA: 0.2 U/dL

## 2016-10-29 MED ORDER — CEPHALEXIN 500 MG PO CAPS
500.0000 mg | ORAL_CAPSULE | Freq: Three times a day (TID) | ORAL | 0 refills | Status: DC
Start: 2016-10-29 — End: 2017-01-09

## 2016-10-29 NOTE — Addendum Note (Signed)
Addended by: Lurlean Nanny on: 10/29/2016 12:38 PM   Modules accepted: Orders

## 2016-10-29 NOTE — Telephone Encounter (Addendum)
Big toe nail is lifting, it is snagging on socks. She clipped it a little. She did start getting pus/fluid out. It is tender so she cannot put sock on, no fever, no discolored skin around nail. She did soak in warm water last night and put neosporin and band-aid on toenail. It is still oozing a little when pressed on.  Has appt w/ PCP today for UTI. Instructed her to have PCP look at toe and it would be OK if she wants to rx an antibiotic.  Her last taxotere carbo herceptin perjeta was 3/22 Her last herceptin perjeta was 7/26  Her left breast is swollen and tender. The color undertone is purplish or grayish. Slowly getting sorer underneath. Over the last month. She is wanting to get it looked at.  She has an infusion appt 8/16 at 0830 and would like to have someone look at it then if possible. Or next week with Dr Lindi Adie. This message forwarded to Dr Magrinat's nurse to assess possibility of seeing Dr Jana Hakim this week.

## 2016-10-29 NOTE — Progress Notes (Signed)
Subjective:    Patient ID: Brianna James, female    DOB: [birth year], 36 y.o.   MRN: [id number]  HPI  Pt presents to the clinic today with c/o urinary frequency, incomplete bladder empthying and dysuria. She reports this started 3-4 days ago. She took AZO OTC over the weekend and reports some improvement. She denies vaginal discharge, or odor.   She also c/o left great toe pain and discharge. She noticed this 1 day ago after cutting her toenails. She cut her nails too short and now pus is coming out from under her great toenail. She has been soaking her foot with some relief.  Review of Systems  Past Medical History:  Diagnosis Date  . Cancer ([place]) 01/2016   breast  . Chicken pox   . Hematoma    left breast  . History of radiation therapy 07/02/16- 08/13/16   Left Breast 50 Gy in 25 fractions, Left Breast Boost 10 Gy in 5 fractions, Left Axilla 45 Gy in 25 fractions.   Marland Kitchen HSV infection   . PONV (postoperative nausea and vomiting)     Current Outpatient Prescriptions  Medication Sig Dispense Refill  . Pertuzumab (PERJETA IV) Inject into the vein.    . Prenatal Multivit-Min-Fe-FA (PRENATAL VITAMINS PO) Take 2 tablets by mouth.    . Trastuzumab (HERCEPTIN IV) Inject into the vein.    Marland Kitchen triamcinolone cream (KENALOG) 0.5 % Apply 1 application topically 2 (two) times daily. 30 g 0  . cephALEXin (KEFLEX) 500 MG capsule Take 1 capsule (500 mg total) by mouth 3 (three) times daily. 21 capsule 0   No current facility-administered medications for this visit.     Allergies  Allergen Reactions  . Other Swelling    Magic Mouthwash w/ lidocaine  . Oxycodone Nausea And Vomiting    Pt reports adverse reaction.   . Peanuts [Peanut Oil] Hives    Family History  Problem Relation Age of Onset  . Arthritis Mother   . Stroke Mother   . Stroke Maternal Aunt   . Stroke Maternal Uncle   . Breast cancer Maternal Grandmother        dx at 57 or younger; d. 41y  . Lung cancer Maternal  Grandfather   . Stroke Paternal Grandmother   . Uterine cancer Paternal Grandmother        dx. 61-62  . Other Paternal Grandmother        colostomy bag in her 78s; unspecified reason  . Lung cancer Paternal Grandfather   . Uterine cancer Maternal Aunt        dx unspecified age  . Breast cancer Other        maternal great grandmother (MGM's mother) d. 23    Social History   Social History  . Marital status: Married    Spouse name: N/A  . Number of children: N/A  . Years of education: N/A   Occupational History  . Not on file.   Social History Main Topics  . Smoking status: Never Smoker  . Smokeless tobacco: Never Used  . Alcohol use 0.0 oz/week     Comment: rare - wine maybe 2x per yr  . Drug use: No  . Sexual activity: Yes    Birth control/ protection: None   Other Topics Concern  . Not on file   Social History Narrative  . No narrative on file     Constitutional: Denies fever, malaise, fatigue, headache or abrupt weight changes.  Gastrointestinal: Denies abdominal  pain, bloating, constipation, diarrhea or blood in the stool.  GU: Pt reports frequency and dysuria. Denies urgency, burning sensation, blood in urine, odor or discharge. Musculoskeletal: Pt reports toe pain and drainage. Denies decrease in range of motion, difficulty with gait, muscle pain or joint swelling.   No other specific complaints in a complete review of systems (except as listed in HPI above).     Objective:   Physical Exam  BP 108/66   Pulse 86   Temp 98.3 F (36.8 C) (Oral)   Wt 225 lb 8 oz (102.3 kg)   LMP 10/02/2016   SpO2 98%   BMI 35.32 kg/m  Wt Readings from Last 3 Encounters:  10/29/16 225 lb 8 oz (102.3 kg)  10/25/16 226 lb 4 oz (102.6 kg)  10/10/16 223 lb 6.4 oz (101.3 kg)    General: Appears her stated age, obese in NAD. Skin: Redness noted at tip of left great toe. Left great toenail lifting up, pus underneath toenail Abdomen: Soft and nontender. No CVA tenderness  noted. Musculoskeletal: Normal flexion and extension of the left great toe.  No difficulty with gait.    BMET    Component Value Date/Time   NA 138 10/10/2016 0911   K 4.3 10/10/2016 0911   CL 98 (L) 02/29/2016 0148   CO2 26 10/10/2016 0911   GLUCOSE 96 10/10/2016 0911   BUN 5.8 (L) 10/10/2016 0911   CREATININE 0.9 10/10/2016 0911   CALCIUM 9.1 10/10/2016 0911    Lipid Panel  No results found for: CHOL, TRIG, HDL, CHOLHDL, VLDL, LDLCALC  CBC    Component Value Date/Time   WBC 6.7 10/10/2016 0911   WBC 18.6 (H) 07/25/2014 0822   RBC 4.50 10/10/2016 0911   RBC 3.71 (L) 07/25/2014 0822   HGB 10.9 (L) 10/10/2016 0911   HCT 34.2 (L) 10/10/2016 0911   PLT 228 10/10/2016 0911   MCV 76.0 (L) 10/10/2016 0911   MCH 24.2 (L) 10/10/2016 0911   MCH 27.0 07/25/2014 0822   MCHC 31.9 10/10/2016 0911   MCHC 33.0 07/25/2014 0822   RDW 16.9 (H) 10/10/2016 0911   LYMPHSABS 1.6 10/10/2016 0911   MONOABS 0.4 10/10/2016 0911   EOSABS 0.4 10/10/2016 0911   BASOSABS 0.0 10/10/2016 0911    Hgb A1C No results found for: HGBA1C          Assessment & Plan:   Urinary Frequency, Dysuria and Incomplete Bladder Emptying:  Urinalysis normal Likely cystist Advised her to push water, cut back on the coffee Will monitor  Left Great Toenail Infection:  Soak in Epsom Salts 2 x day for 10 minutes eRx for Keflex 500 mg TID x 7 days  Return precautions discussed Webb Silversmith, NP

## 2016-10-29 NOTE — Telephone Encounter (Signed)
Dr Jana Hakim is currently has no openings on 8/16 - but if treatment room nurse can alert collaborative nurse and or MD when pt is in the treatment room - MD may be able to see the patient during therapy.

## 2016-10-29 NOTE — Patient Instructions (Signed)

## 2016-10-30 NOTE — Telephone Encounter (Signed)
lvm per Val's attached note.

## 2016-10-31 ENCOUNTER — Ambulatory Visit (HOSPITAL_BASED_OUTPATIENT_CLINIC_OR_DEPARTMENT_OTHER): Payer: 59

## 2016-10-31 ENCOUNTER — Other Ambulatory Visit: Payer: Self-pay | Admitting: Oncology

## 2016-10-31 VITALS — BP 108/69 | HR 85 | Temp 99.5°F | Resp 18

## 2016-10-31 DIAGNOSIS — Z5112 Encounter for antineoplastic immunotherapy: Secondary | ICD-10-CM | POA: Diagnosis not present

## 2016-10-31 DIAGNOSIS — C50412 Malignant neoplasm of upper-outer quadrant of left female breast: Secondary | ICD-10-CM

## 2016-10-31 DIAGNOSIS — Z171 Estrogen receptor negative status [ER-]: Principal | ICD-10-CM

## 2016-10-31 MED ORDER — SODIUM CHLORIDE 0.9 % IV SOLN
Freq: Once | INTRAVENOUS | Status: AC
  Administered 2016-10-31: 09:00:00 via INTRAVENOUS

## 2016-10-31 MED ORDER — SODIUM CHLORIDE 0.9 % IV SOLN
600.0000 mg | Freq: Once | INTRAVENOUS | Status: AC
  Administered 2016-10-31: 600 mg via INTRAVENOUS
  Filled 2016-10-31: qty 28.57

## 2016-10-31 MED ORDER — DIPHENHYDRAMINE HCL 25 MG PO CAPS
50.0000 mg | ORAL_CAPSULE | Freq: Once | ORAL | Status: AC
  Administered 2016-10-31: 50 mg via ORAL

## 2016-10-31 MED ORDER — PERTUZUMAB CHEMO INJECTION 420 MG/14ML
420.0000 mg | Freq: Once | INTRAVENOUS | Status: AC
  Administered 2016-10-31: 420 mg via INTRAVENOUS
  Filled 2016-10-31: qty 14

## 2016-10-31 MED ORDER — SODIUM CHLORIDE 0.9% FLUSH
10.0000 mL | INTRAVENOUS | Status: DC | PRN
  Administered 2016-10-31: 10 mL
  Filled 2016-10-31: qty 10

## 2016-10-31 MED ORDER — HEPARIN SOD (PORK) LOCK FLUSH 100 UNIT/ML IV SOLN
500.0000 [IU] | Freq: Once | INTRAVENOUS | Status: AC | PRN
  Administered 2016-10-31: 500 [IU]
  Filled 2016-10-31: qty 5

## 2016-10-31 MED ORDER — ACETAMINOPHEN 325 MG PO TABS
650.0000 mg | ORAL_TABLET | Freq: Once | ORAL | Status: AC
  Administered 2016-10-31: 650 mg via ORAL

## 2016-10-31 NOTE — Progress Notes (Unsigned)
Ms. Brianna James from treatment today and she complained regarding as swollen and tender left breast. I evaluated her in the treatment area. The breast may be minimally swollen but there is no clear seroma or hematoma, there is no erythema or warmth. There is some tenderness to palpation which is common postop. There is no finding of concern.  I discussed the various kinds of pains that patients may develop after surgery and with the treatments that she is receiving. This was reassuring to her. I saw no reason not to continue her anti-HER-2 immunotherapy, which she is receiving today.

## 2016-10-31 NOTE — Progress Notes (Signed)
Dr. Jana Hakim in to evaluate breast edema/ discoloration.

## 2016-10-31 NOTE — Patient Instructions (Signed)
[facility] Discharge Instructions for Patients Receiving Chemotherapy  Today you received the following chemotherapy agents: Herceptin and Perjeta. If you develop nausea and vomiting that is not controlled by your nausea medication, call the clinic.   BELOW ARE SYMPTOMS THAT SHOULD BE REPORTED IMMEDIATELY:  *FEVER GREATER THAN 100.5 F  *CHILLS WITH OR WITHOUT FEVER  NAUSEA AND VOMITING THAT IS NOT CONTROLLED WITH YOUR NAUSEA MEDICATION  *UNUSUAL SHORTNESS OF BREATH  *UNUSUAL BRUISING OR BLEEDING  TENDERNESS IN MOUTH AND THROAT WITH OR WITHOUT PRESENCE OF ULCERS  *URINARY PROBLEMS  *BOWEL PROBLEMS  UNUSUAL RASH Items with * indicate a potential emergency and should be followed up as soon as possible.  Feel free to call the clinic should you have any questions or concerns. The clinic phone number is [phone].  Please show the [facility] at check-in to the Emergency Department and triage nurse.

## 2016-11-20 NOTE — Assessment & Plan Note (Signed)
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 Adjuvant chemotherapy with TCHPX 6 cycles followed by Herceptin-Perjetamaintenance for one year started 02/22/2016 2. Followed by adjuvant radiation completed 08/13/2016 ---------------------------------------------------------------------------------------------------------------------------------------------------- Current treatment: Completed 6 cycles ofTCHP and today she receives maintenance herceptin and Perjeta  Monitoring for chronic toxicities of chemotherapy 1. Severe fatigue: Discussed with her about different exercises that she can do to improve the fatigue. 2. Lack of sexual desire:Counseled Adjuvant radiation completed 08/13/2016  Previously we discussed the results of PERSEPHONE. We decided to continue with therapy for the full duration of 1 year. Patient will need echocardiograms every 3 months. RTC in 3 weeks for herceptin and 6 weeks for MD visit

## 2016-11-21 ENCOUNTER — Other Ambulatory Visit (HOSPITAL_BASED_OUTPATIENT_CLINIC_OR_DEPARTMENT_OTHER): Payer: 59

## 2016-11-21 ENCOUNTER — Ambulatory Visit (HOSPITAL_BASED_OUTPATIENT_CLINIC_OR_DEPARTMENT_OTHER): Payer: 59 | Admitting: Hematology and Oncology

## 2016-11-21 ENCOUNTER — Encounter: Payer: Self-pay | Admitting: Hematology and Oncology

## 2016-11-21 ENCOUNTER — Ambulatory Visit (HOSPITAL_BASED_OUTPATIENT_CLINIC_OR_DEPARTMENT_OTHER): Payer: 59

## 2016-11-21 ENCOUNTER — Other Ambulatory Visit: Payer: Self-pay

## 2016-11-21 ENCOUNTER — Encounter: Payer: Self-pay | Admitting: *Deleted

## 2016-11-21 DIAGNOSIS — C50412 Malignant neoplasm of upper-outer quadrant of left female breast: Secondary | ICD-10-CM

## 2016-11-21 DIAGNOSIS — Z171 Estrogen receptor negative status [ER-]: Principal | ICD-10-CM

## 2016-11-21 DIAGNOSIS — Z5112 Encounter for antineoplastic immunotherapy: Secondary | ICD-10-CM | POA: Diagnosis not present

## 2016-11-21 DIAGNOSIS — R21 Rash and other nonspecific skin eruption: Secondary | ICD-10-CM | POA: Diagnosis not present

## 2016-11-21 LAB — CBC WITH DIFFERENTIAL/PLATELET
BASO%: 0.4 % (ref 0.0–2.0)
Basophils Absolute: 0 10*3/uL (ref 0.0–0.1)
EOS%: 1.8 % (ref 0.0–7.0)
Eosinophils Absolute: 0.1 10*3/uL (ref 0.0–0.5)
HEMATOCRIT: 35.1 % (ref 34.8–46.6)
HEMOGLOBIN: 11.5 g/dL — AB (ref 11.6–15.9)
LYMPH#: 1.8 10*3/uL (ref 0.9–3.3)
LYMPH%: 29.1 % (ref 14.0–49.7)
MCH: 24.1 pg — ABNORMAL LOW (ref 25.1–34.0)
MCHC: 32.8 g/dL (ref 31.5–36.0)
MCV: 73.7 fL — ABNORMAL LOW (ref 79.5–101.0)
MONO#: 0.4 10*3/uL (ref 0.1–0.9)
MONO%: 6.5 % (ref 0.0–14.0)
NEUT%: 62.2 % (ref 38.4–76.8)
NEUTROS ABS: 3.8 10*3/uL (ref 1.5–6.5)
PLATELETS: 263 10*3/uL (ref 145–400)
RBC: 4.76 10*6/uL (ref 3.70–5.45)
RDW: 18.3 % — AB (ref 11.2–14.5)
WBC: 6.1 10*3/uL (ref 3.9–10.3)

## 2016-11-21 LAB — COMPREHENSIVE METABOLIC PANEL
ALBUMIN: 3.5 g/dL (ref 3.5–5.0)
ALT: 7 U/L (ref 0–55)
ANION GAP: 7 meq/L (ref 3–11)
AST: 20 U/L (ref 5–34)
Alkaline Phosphatase: 83 U/L (ref 40–150)
BILIRUBIN TOTAL: 0.52 mg/dL (ref 0.20–1.20)
BUN: 11 mg/dL (ref 7.0–26.0)
CALCIUM: 9.7 mg/dL (ref 8.4–10.4)
CHLORIDE: 102 meq/L (ref 98–109)
CO2: 28 mEq/L (ref 22–29)
CREATININE: 1.1 mg/dL (ref 0.6–1.1)
EGFR: 79 mL/min/{1.73_m2} — ABNORMAL LOW (ref >90)
Glucose: 90 mg/dl (ref 70–140)
Potassium: 4.2 mEq/L (ref 3.5–5.1)
Sodium: 137 mEq/L (ref 136–145)
TOTAL PROTEIN: 7.5 g/dL (ref 6.4–8.3)

## 2016-11-21 MED ORDER — ACETAMINOPHEN 325 MG PO TABS
650.0000 mg | ORAL_TABLET | Freq: Once | ORAL | Status: AC
  Administered 2016-11-21: 650 mg via ORAL

## 2016-11-21 MED ORDER — SODIUM CHLORIDE 0.9 % IV SOLN
Freq: Once | INTRAVENOUS | Status: AC
  Administered 2016-11-21: 11:00:00 via INTRAVENOUS

## 2016-11-21 MED ORDER — TRASTUZUMAB CHEMO 150 MG IV SOLR
600.0000 mg | Freq: Once | INTRAVENOUS | Status: AC
  Administered 2016-11-21: 600 mg via INTRAVENOUS
  Filled 2016-11-21: qty 28.57

## 2016-11-21 MED ORDER — LIDOCAINE-PRILOCAINE 2.5-2.5 % EX CREA: 1.0000 "application " | TOPICAL_CREAM | CUTANEOUS | 0 refills | Status: DC | PRN

## 2016-11-21 MED ORDER — DIPHENHYDRAMINE HCL 25 MG PO CAPS
50.0000 mg | ORAL_CAPSULE | Freq: Once | ORAL | Status: AC
  Administered 2016-11-21: 50 mg via ORAL

## 2016-11-21 MED ORDER — ACETAMINOPHEN 325 MG PO TABS
ORAL_TABLET | ORAL | Status: AC
  Filled 2016-11-21: qty 2

## 2016-11-21 MED ORDER — SODIUM CHLORIDE 0.9% FLUSH
10.0000 mL | INTRAVENOUS | Status: DC | PRN
  Administered 2016-11-21: 10 mL
  Filled 2016-11-21: qty 10

## 2016-11-21 MED ORDER — DIPHENHYDRAMINE HCL 25 MG PO CAPS
ORAL_CAPSULE | ORAL | Status: AC
  Filled 2016-11-21: qty 2

## 2016-11-21 MED ORDER — HEPARIN SOD (PORK) LOCK FLUSH 100 UNIT/ML IV SOLN
500.0000 [IU] | Freq: Once | INTRAVENOUS | Status: AC | PRN
Start: 2016-11-21 — End: 2016-11-21
  Administered 2016-11-21: 500 [IU]
  Filled 2016-11-21: qty 5

## 2016-11-21 MED ORDER — PERTUZUMAB CHEMO INJECTION 420 MG/14ML
420.0000 mg | Freq: Once | INTRAVENOUS | Status: AC
  Administered 2016-11-21: 420 mg via INTRAVENOUS
  Filled 2016-11-21: qty 14

## 2016-11-21 NOTE — Progress Notes (Signed)
Patient Care Team: Jearld Fenton, NP as PCP - General (Internal Medicine) Servando Salina, MD as Consulting Physician (Obstetrics and Gynecology) Nicholas Lose, MD as Consulting Physician (Hematology and Oncology) Delice Bison Charlestine Massed, NP as Nurse Practitioner (Hematology and Oncology)  DIAGNOSIS:  Encounter Diagnosis  Name Primary?  . Malignant neoplasm of upper-outer quadrant of left breast in female, estrogen receptor negative ([place])     SUMMARY OF ONCOLOGIC HISTORY:   Malignant neoplasm of upper-outer quadrant of left breast in female, estrogen receptor negative ([place])   12/19/2015 Initial Diagnosis    Left breast biopsy: IDC with DCI S, grade 3, ER 0%, PR 0%, KI67 40%, her 2 positive ratio 6.15, copy number 16.5; palpable lump left breast 1.6 cm irregular oval mass, T1cN0 stage I a clinical stage      01/17/2016 Surgery    Left Lumpectomy: IDC 4.1 cm with DCIS, Margin 0.1 cm, 1/3 LN Positive, ER/PR Neg, Her 2 Pos with Ki 67 of 40%, T2N1 (stage 2B)      02/22/2016 -  Chemotherapy    Adjuvant chemotherapy with TCH Perjeta 6 cycles followed by Herceptin Perjeta maintenance       07/02/2016 - 08/13/2016 Radiation Therapy    Adjuvant radiation therapy       CHIEF COMPLIANT: Follow-up on Herceptin and Perjeta maintenance  INTERVAL HISTORY: Brianna James is a 77 over the above-mentioned history of left breast cancer who is currently on adjuvant treatment with Herceptin and Perjeta maintenance therapy. Overall she appears to be tolerating the treatment fairly well. She does have fatigue. She denies any nausea vomiting or diarrhea.She is complaining of a rash on the chest and back this is maculopapular nature. It was associated with more severe swelling and itching. It has improved but not fully resolved. She's had it for the past 3 weeks. She went to [place] 3 weeks ago and thinks she may have contact with something that brought on the rash. She was complaining of mild  fullness in the left breast along the surgical scar.  REVIEW OF SYSTEMS:   Constitutional: Denies fevers, chills or abnormal weight loss Eyes: Denies blurriness of vision Ears, nose, mouth, throat, and face: Denies mucositis or sore throat Respiratory: Denies cough, dyspnea or wheezes Cardiovascular: Denies palpitation, chest discomfort Gastrointestinal:  Denies nausea, heartburn or change in bowel habits Skin: Rash on the chest and back Lymphatics: Denies new lymphadenopathy or easy bruising Neurological:Denies numbness, tingling or new weaknesses Behavioral/Psych: Mood is stable, no new changes  Extremities: No lower extremity edema Breast: Fullness along the surgical scar and nodularity All other systems were reviewed with the patient and are negative.  I have reviewed the past medical history, past surgical history, social history and family history with the patient and they are unchanged from previous note.  ALLERGIES:  is allergic to other; oxycodone; and peanuts [peanut oil].  MEDICATIONS:  Current Outpatient Prescriptions  Medication Sig Dispense Refill  . cephALEXin (KEFLEX) 500 MG capsule Take 1 capsule (500 mg total) by mouth 3 (three) times daily. 21 capsule 0  . lidocaine-prilocaine (EMLA) cream Apply 1 application topically as needed. 30 g 0  . Pertuzumab (PERJETA IV) Inject into the vein.    . Prenatal Multivit-Min-Fe-FA (PRENATAL VITAMINS PO) Take 2 tablets by mouth.    . Trastuzumab (HERCEPTIN IV) Inject into the vein.    Marland Kitchen triamcinolone cream (KENALOG) 0.5 % Apply 1 application topically 2 (two) times daily. 30 g 0   No current facility-administered medications for this  visit.    Facility-Administered Medications Ordered in Other Visits  Medication Dose Route Frequency Provider Last Rate Last Dose  . heparin lock flush 100 unit/mL  500 Units Intracatheter Once PRN Nicholas Lose, MD      . pertuzumab (PERJETA) 420 mg in sodium chloride 0.9 % 250 mL chemo infusion   420 mg Intravenous Once Nicholas Lose, MD 528 mL/hr at 11/21/16 1253 420 mg at 11/21/16 1253  . sodium chloride flush (NS) 0.9 % injection 10 mL  10 mL Intracatheter PRN Nicholas Lose, MD        PHYSICAL EXAMINATION: ECOG PERFORMANCE STATUS: 1 - Symptomatic but completely ambulatory  Vitals:   11/21/16 0952  BP: 109/74  Pulse: 73  Resp: 20  Temp: 98 F (36.7 C)  SpO2: 99%   Filed Weights   11/21/16 0952  Weight: 226 lb 4.8 oz (102.6 kg)    GENERAL:alert, no distress and comfortable SKIN: Skin rash in the chest and back EYES: normal, Conjunctiva are pink and non-injected, sclera clear OROPHARYNX:no exudate, no erythema and lips, buccal mucosa, and tongue normal  NECK: supple, thyroid normal size, non-tender, without nodularity LYMPH:  no palpable lymphadenopathy in the cervical, axillary or inguinal LUNGS: clear to auscultation and percussion with normal breathing effort HEART: regular rate & rhythm and no murmurs and no lower extremity edema ABDOMEN:abdomen soft, non-tender and normal bowel sounds MUSCULOSKELETAL:no cyanosis of digits and no clubbing  NEURO: alert & oriented x 3 with fluent speech, no focal motor/sensory deficits EXTREMITIES: No lower extremity edema BREAST: No palpable masses or nodules in either right or left breasts. No palpable axillary supraclavicular or infraclavicular adenopathy no breast tenderness or nipple discharge. (exam performed in the presence of a chaperone)  LABORATORY DATA:  I have reviewed the data as listed   Chemistry      Component Value Date/Time   NA 137 11/21/2016 0925   K 4.2 11/21/2016 0925   CL 98 (L) 02/29/2016 0148   CO2 28 11/21/2016 0925   BUN 11.0 11/21/2016 0925   CREATININE 1.1 11/21/2016 0925      Component Value Date/Time   CALCIUM 9.7 11/21/2016 0925   ALKPHOS 83 11/21/2016 0925   AST 20 11/21/2016 0925   ALT 7 11/21/2016 0925   BILITOT 0.52 11/21/2016 0925       Lab Results  Component Value Date   WBC  6.1 11/21/2016   HGB 11.5 (L) 11/21/2016   HCT 35.1 11/21/2016   MCV 73.7 (L) 11/21/2016   PLT 263 11/21/2016   NEUTROABS 3.8 11/21/2016    ASSESSMENT & PLAN:  Malignant neoplasm of upper-outer quadrant of left breast in female, estrogen receptor negative ([place]) 01/17/16: Left Lumpectomy: 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 Adjuvant chemotherapy with TCHPX 6 cycles followed by Herceptin-Perjetamaintenance for one year started 02/22/2016 2. Followed by adjuvant radiation completed 08/13/2016 ---------------------------------------------------------------------------------------------------------------------------------------------------- Current treatment: Completed 6 cycles ofTCHP and today she receives maintenance herceptin and Perjeta  Monitoring for chronic toxicities of chemotherapy 1. Severe fatigue: Discussed with her about different exercises that she can do to improve the fatigue. 2. Lack of sexual desire:Counseled Adjuvant radiation completed 08/13/2016  Rash on the chest and back: I encouraged her to use Benadryl) watch it. If it does not improve we can consider giving her a Medrol Dosepak or referral to dermatology.  Previously we discussed the results of PERSEPHONE. We  decided to continue with therapy for the full duration of 1 year. Patient will need echocardiograms every 3 months. RTC in 3 weeks for herceptin And Perjeta and 6 weeks for MD visit  I spent 25 minutes talking to the patient of which more than half was spent in counseling and coordination of care.  No orders of the defined types were placed in this encounter.  The patient has a good understanding of the overall plan. she agrees with it. she will call with any problems that may develop before the next visit here.   Rulon Eisenmenger, MD 11/21/16

## 2016-11-28 ENCOUNTER — Telehealth (HOSPITAL_COMMUNITY): Payer: Self-pay | Admitting: Vascular Surgery

## 2016-11-28 NOTE — Telephone Encounter (Signed)
Left pt messag eto make rst cancer appt w/ db w/ echo in Oct

## 2016-12-09 ENCOUNTER — Telehealth (HOSPITAL_COMMUNITY): Payer: Self-pay | Admitting: Vascular Surgery

## 2016-12-09 NOTE — Telephone Encounter (Signed)
Left pt message to make brst f/u appt w/ echo

## 2016-12-12 ENCOUNTER — Ambulatory Visit (HOSPITAL_BASED_OUTPATIENT_CLINIC_OR_DEPARTMENT_OTHER): Payer: 59

## 2016-12-12 VITALS — BP 116/76 | HR 76 | Temp 98.6°F | Resp 18

## 2016-12-12 DIAGNOSIS — C50412 Malignant neoplasm of upper-outer quadrant of left female breast: Secondary | ICD-10-CM | POA: Diagnosis not present

## 2016-12-12 DIAGNOSIS — Z171 Estrogen receptor negative status [ER-]: Principal | ICD-10-CM

## 2016-12-12 DIAGNOSIS — Z5112 Encounter for antineoplastic immunotherapy: Secondary | ICD-10-CM | POA: Diagnosis not present

## 2016-12-12 MED ORDER — TRASTUZUMAB CHEMO 150 MG IV SOLR
600.0000 mg | Freq: Once | INTRAVENOUS | Status: AC
  Administered 2016-12-12: 600 mg via INTRAVENOUS
  Filled 2016-12-12: qty 28.57

## 2016-12-12 MED ORDER — SODIUM CHLORIDE 0.9 % IV SOLN
Freq: Once | INTRAVENOUS | Status: AC
  Administered 2016-12-12: 09:00:00 via INTRAVENOUS

## 2016-12-12 MED ORDER — ACETAMINOPHEN 325 MG PO TABS
ORAL_TABLET | ORAL | Status: AC
  Filled 2016-12-12: qty 2

## 2016-12-12 MED ORDER — DIPHENHYDRAMINE HCL 25 MG PO CAPS
ORAL_CAPSULE | ORAL | Status: AC
  Filled 2016-12-12: qty 2

## 2016-12-12 MED ORDER — SODIUM CHLORIDE 0.9 % IV SOLN
420.0000 mg | Freq: Once | INTRAVENOUS | Status: AC
  Administered 2016-12-12: 420 mg via INTRAVENOUS
  Filled 2016-12-12: qty 14

## 2016-12-12 MED ORDER — HEPARIN SOD (PORK) LOCK FLUSH 100 UNIT/ML IV SOLN
500.0000 [IU] | Freq: Once | INTRAVENOUS | Status: AC | PRN
  Administered 2016-12-12: 500 [IU]
  Filled 2016-12-12: qty 5

## 2016-12-12 MED ORDER — ACETAMINOPHEN 325 MG PO TABS
650.0000 mg | ORAL_TABLET | Freq: Once | ORAL | Status: AC
  Administered 2016-12-12: 650 mg via ORAL

## 2016-12-12 MED ORDER — SODIUM CHLORIDE 0.9% FLUSH
10.0000 mL | INTRAVENOUS | Status: DC | PRN
  Administered 2016-12-12: 10 mL
  Filled 2016-12-12: qty 10

## 2016-12-12 MED ORDER — DIPHENHYDRAMINE HCL 25 MG PO CAPS
50.0000 mg | ORAL_CAPSULE | Freq: Once | ORAL | Status: AC
  Administered 2016-12-12: 50 mg via ORAL

## 2016-12-12 NOTE — Patient Instructions (Signed)
Pertuzumab injection What is this medicine? PERTUZUMAB (per TOOZ ue mab) is a monoclonal antibody. It is used to treat breast cancer. This medicine may be used for other purposes; ask your health care provider or pharmacist if you have questions. COMMON BRAND NAME(S): PERJETA What should I tell my health care provider before I take this medicine? They need to know if you have any of these conditions: -heart disease -heart failure -high blood pressure -history of irregular heart beat -recent or ongoing radiation therapy -an unusual or allergic reaction to pertuzumab, other medicines, foods, dyes, or preservatives -pregnant or trying to get pregnant -breast-feeding How should I use this medicine? This medicine is for infusion into a vein. It is given by a health care professional in a hospital or clinic setting. Talk to your pediatrician regarding the use of this medicine in children. Special care may be needed. Overdosage: If you think you have taken too much of this medicine contact a poison control center or emergency room at once. NOTE: This medicine is only for you. Do not share this medicine with others. What if I miss a dose? It is important not to miss your dose. Call your doctor or health care professional if you are unable to keep an appointment. What may interact with this medicine? Interactions are not expected. Give your health care provider a list of all the medicines, herbs, non-prescription drugs, or dietary supplements you use. Also tell them if you smoke, drink alcohol, or use illegal drugs. Some items may interact with your medicine. This list may not describe all possible interactions. Give your health care provider a list of all the medicines, herbs, non-prescription drugs, or dietary supplements you use. Also tell them if you smoke, drink alcohol, or use illegal drugs. Some items may interact with your medicine. What should I watch for while using this medicine? Your  condition will be monitored carefully while you are receiving this medicine. Report any side effects. Continue your course of treatment even though you feel ill unless your doctor tells you to stop. Do not become pregnant while taking this medicine or for 7 months after stopping it. Women should inform their doctor if they wish to become pregnant or think they might be pregnant. Women of child-bearing potential will need to have a negative pregnancy test before starting this medicine. There is a potential for serious side effects to an unborn child. Talk to your health care professional or pharmacist for more information. Do not breast-feed an infant while taking this medicine or for 7 months after stopping it. Women must use effective birth control with this medicine. Call your doctor or health care professional for advice if you get a fever, chills or sore throat, or other symptoms of a cold or flu. Do not treat yourself. Try to avoid being around people who are sick. You may experience fever, chills, and headache during the infusion. Report any side effects during the infusion to your health care professional. What side effects may I notice from receiving this medicine? Side effects that you should report to your doctor or health care professional as soon as possible: -breathing problems -chest pain or palpitations -dizziness -feeling faint or lightheaded -fever or chills -skin rash, itching or hives -sore throat -swelling of the face, lips, or tongue -swelling of the legs or ankles -unusually weak or tired Side effects that usually do not require medical attention (report to your doctor or health care professional if they continue or are bothersome): -diarrhea -hair   loss -nausea, vomiting -tiredness This list may not describe all possible side effects. Call your doctor for medical advice about side effects. You may report side effects to FDA at 1-800-FDA-1088. Where should I keep my  medicine? This drug is given in a hospital or clinic and will not be stored at home. NOTE: This sheet is a summary. It may not cover all possible information. If you have questions about this medicine, talk to your doctor, pharmacist, or health care provider.  2018 Elsevier/Gold Standard (2015-04-06 12:08:50) Trastuzumab injection for infusion What is this medicine? TRASTUZUMAB (tras TOO zoo mab) is a monoclonal antibody. It is used to treat breast cancer and stomach cancer. This medicine may be used for other purposes; ask your health care provider or pharmacist if you have questions. COMMON BRAND NAME(S): Herceptin What should I tell my health care provider before I take this medicine? They need to know if you have any of these conditions: -heart disease -heart failure -lung or breathing disease, like asthma -an unusual or allergic reaction to trastuzumab, benzyl alcohol, or other medications, foods, dyes, or preservatives -pregnant or trying to get pregnant -breast-feeding How should I use this medicine? This drug is given as an infusion into a vein. It is administered in a hospital or clinic by a specially trained health care professional. Talk to your pediatrician regarding the use of this medicine in children. This medicine is not approved for use in children. Overdosage: If you think you have taken too much of this medicine contact a poison control center or emergency room at once. NOTE: This medicine is only for you. Do not share this medicine with others. What if I miss a dose? It is important not to miss a dose. Call your doctor or health care professional if you are unable to keep an appointment. What may interact with this medicine? This medicine may interact with the following medications: -certain types of chemotherapy, such as daunorubicin, doxorubicin, epirubicin, and idarubicin This list may not describe all possible interactions. Give your health care provider a list of  all the medicines, herbs, non-prescription drugs, or dietary supplements you use. Also tell them if you smoke, drink alcohol, or use illegal drugs. Some items may interact with your medicine. What should I watch for while using this medicine? Visit your doctor for checks on your progress. Report any side effects. Continue your course of treatment even though you feel ill unless your doctor tells you to stop. Call your doctor or health care professional for advice if you get a fever, chills or sore throat, or other symptoms of a cold or flu. Do not treat yourself. Try to avoid being around people who are sick. You may experience fever, chills and shaking during your first infusion. These effects are usually mild and can be treated with other medicines. Report any side effects during the infusion to your health care professional. Fever and chills usually do not happen with later infusions. Do not become pregnant while taking this medicine or for 7 months after stopping it. Women should inform their doctor if they wish to become pregnant or think they might be pregnant. Women of child-bearing potential will need to have a negative pregnancy test before starting this medicine. There is a potential for serious side effects to an unborn child. Talk to your health care professional or pharmacist for more information. Do not breast-feed an infant while taking this medicine or for 7 months after stopping it. Women must use effective birth control   with this medicine. What side effects may I notice from receiving this medicine? Side effects that you should report to your doctor or health care professional as soon as possible: -allergic reactions like skin rash, itching or hives, swelling of the face, lips, or tongue -chest pain or palpitations -cough -dizziness -feeling faint or lightheaded, falls -fever -general ill feeling or flu-like symptoms -signs of worsening heart failure like breathing problems;  swelling in your legs and feet -unusually weak or tired Side effects that usually do not require medical attention (report to your doctor or health care professional if they continue or are bothersome): -bone pain -changes in taste -diarrhea -joint pain -nausea/vomiting -weight loss This list may not describe all possible side effects. Call your doctor for medical advice about side effects. You may report side effects to FDA at 1-800-FDA-1088. Where should I keep my medicine? This drug is given in a hospital or clinic and will not be stored at home. NOTE: This sheet is a summary. It may not cover all possible information. If you have questions about this medicine, talk to your doctor, pharmacist, or health care provider.  2018 Elsevier/Gold Standard (2016-02-27 14:37:52)  

## 2016-12-17 NOTE — Telephone Encounter (Signed)
Pt called back.  She is scheduled 01/09/17 for Echo and f/u with Dr. Haroldine Laws.

## 2016-12-30 ENCOUNTER — Encounter: Payer: Self-pay | Admitting: *Deleted

## 2016-12-31 ENCOUNTER — Telehealth: Payer: Self-pay | Admitting: Hematology and Oncology

## 2016-12-31 NOTE — Telephone Encounter (Signed)
Left voicemail for patient regarding appts that have been scheduled per 10/15 sch msg.

## 2017-01-01 ENCOUNTER — Encounter: Payer: Self-pay | Admitting: Pharmacy Technician

## 2017-01-01 NOTE — Progress Notes (Signed)
The patient appears to have termed insurance per Aleen Sells. I will meet with the patient during infusion 01/02/17, to inquire about insurance and will apply for drug assistance for Herceptin & Perjeta if needed.

## 2017-01-02 ENCOUNTER — Other Ambulatory Visit: Payer: Self-pay | Admitting: General Surgery

## 2017-01-02 ENCOUNTER — Ambulatory Visit (HOSPITAL_BASED_OUTPATIENT_CLINIC_OR_DEPARTMENT_OTHER): Payer: 59

## 2017-01-02 ENCOUNTER — Other Ambulatory Visit (HOSPITAL_BASED_OUTPATIENT_CLINIC_OR_DEPARTMENT_OTHER): Payer: 59

## 2017-01-02 ENCOUNTER — Ambulatory Visit (HOSPITAL_BASED_OUTPATIENT_CLINIC_OR_DEPARTMENT_OTHER): Payer: 59 | Admitting: Hematology and Oncology

## 2017-01-02 DIAGNOSIS — C50412 Malignant neoplasm of upper-outer quadrant of left female breast: Secondary | ICD-10-CM

## 2017-01-02 DIAGNOSIS — Z171 Estrogen receptor negative status [ER-]: Secondary | ICD-10-CM | POA: Diagnosis not present

## 2017-01-02 DIAGNOSIS — R21 Rash and other nonspecific skin eruption: Secondary | ICD-10-CM | POA: Diagnosis not present

## 2017-01-02 DIAGNOSIS — Z5112 Encounter for antineoplastic immunotherapy: Secondary | ICD-10-CM

## 2017-01-02 DIAGNOSIS — R5383 Other fatigue: Secondary | ICD-10-CM | POA: Diagnosis not present

## 2017-01-02 LAB — CBC WITH DIFFERENTIAL/PLATELET
BASO%: 0.2 % (ref 0.0–2.0)
Basophils Absolute: 0 10*3/uL (ref 0.0–0.1)
EOS%: 1.8 % (ref 0.0–7.0)
Eosinophils Absolute: 0.1 10*3/uL (ref 0.0–0.5)
HEMATOCRIT: 36 % (ref 34.8–46.6)
HGB: 11.2 g/dL — ABNORMAL LOW (ref 11.6–15.9)
LYMPH#: 2 10*3/uL (ref 0.9–3.3)
LYMPH%: 30.6 % (ref 14.0–49.7)
MCH: 24.2 pg — ABNORMAL LOW (ref 25.1–34.0)
MCHC: 31.1 g/dL — ABNORMAL LOW (ref 31.5–36.0)
MCV: 77.8 fL — AB (ref 79.5–101.0)
MONO#: 0.4 10*3/uL (ref 0.1–0.9)
MONO%: 6.2 % (ref 0.0–14.0)
NEUT%: 61.2 % (ref 38.4–76.8)
NEUTROS ABS: 4.1 10*3/uL (ref 1.5–6.5)
PLATELETS: 259 10*3/uL (ref 145–400)
RBC: 4.63 10*6/uL (ref 3.70–5.45)
RDW: 18.1 % — ABNORMAL HIGH (ref 11.2–14.5)
WBC: 6.6 10*3/uL (ref 3.9–10.3)

## 2017-01-02 LAB — COMPREHENSIVE METABOLIC PANEL
ALBUMIN: 3.5 g/dL (ref 3.5–5.0)
ALK PHOS: 81 U/L (ref 40–150)
ALT: 7 U/L (ref 0–55)
ANION GAP: 7 meq/L (ref 3–11)
AST: 19 U/L (ref 5–34)
BILIRUBIN TOTAL: 0.39 mg/dL (ref 0.20–1.20)
BUN: 8.7 mg/dL (ref 7.0–26.0)
CALCIUM: 9 mg/dL (ref 8.4–10.4)
CO2: 27 mEq/L (ref 22–29)
CREATININE: 1 mg/dL (ref 0.6–1.1)
Chloride: 105 mEq/L (ref 98–109)
Glucose: 94 mg/dl (ref 70–140)
Potassium: 4.4 mEq/L (ref 3.5–5.1)
Sodium: 139 mEq/L (ref 136–145)
TOTAL PROTEIN: 7.3 g/dL (ref 6.4–8.3)

## 2017-01-02 MED ORDER — ACETAMINOPHEN 325 MG PO TABS
650.0000 mg | ORAL_TABLET | Freq: Once | ORAL | Status: AC
  Administered 2017-01-02: 650 mg via ORAL

## 2017-01-02 MED ORDER — HEPARIN SOD (PORK) LOCK FLUSH 100 UNIT/ML IV SOLN
500.0000 [IU] | Freq: Once | INTRAVENOUS | Status: AC | PRN
  Administered 2017-01-02: 500 [IU]
  Filled 2017-01-02: qty 5

## 2017-01-02 MED ORDER — SODIUM CHLORIDE 0.9 % IV SOLN
Freq: Once | INTRAVENOUS | Status: AC
  Administered 2017-01-02: 11:00:00 via INTRAVENOUS

## 2017-01-02 MED ORDER — DIPHENHYDRAMINE HCL 25 MG PO CAPS
ORAL_CAPSULE | ORAL | Status: AC
  Filled 2017-01-02: qty 2

## 2017-01-02 MED ORDER — SODIUM CHLORIDE 0.9 % IV SOLN
600.0000 mg | Freq: Once | INTRAVENOUS | Status: AC
  Administered 2017-01-02: 600 mg via INTRAVENOUS
  Filled 2017-01-02: qty 28.6

## 2017-01-02 MED ORDER — CLINDAMYCIN PHOSPHATE 1 % EX GEL: Freq: Two times a day (BID) | CUTANEOUS | 0 refills | Status: DC

## 2017-01-02 MED ORDER — DIPHENHYDRAMINE HCL 25 MG PO CAPS
50.0000 mg | ORAL_CAPSULE | Freq: Once | ORAL | Status: AC
  Administered 2017-01-02: 50 mg via ORAL

## 2017-01-02 MED ORDER — ACETAMINOPHEN 325 MG PO TABS
ORAL_TABLET | ORAL | Status: AC
  Filled 2017-01-02: qty 2

## 2017-01-02 MED ORDER — SODIUM CHLORIDE 0.9% FLUSH
10.0000 mL | INTRAVENOUS | Status: DC | PRN
  Administered 2017-01-02: 10 mL
  Filled 2017-01-02: qty 10

## 2017-01-02 MED ORDER — SODIUM CHLORIDE 0.9 % IV SOLN
420.0000 mg | Freq: Once | INTRAVENOUS | Status: AC
  Administered 2017-01-02: 420 mg via INTRAVENOUS
  Filled 2017-01-02: qty 14

## 2017-01-02 NOTE — Patient Instructions (Signed)
[facility] Discharge Instructions for Patients Receiving Chemotherapy  Today you received the following chemotherapy agents Herceptin and Perjeta   To help prevent nausea and vomiting after your treatment, we encourage you to take your nausea medication as directed.    If you develop nausea and vomiting that is not controlled by your nausea medication, call the clinic.   BELOW ARE SYMPTOMS THAT SHOULD BE REPORTED IMMEDIATELY:  *FEVER GREATER THAN 100.5 F  *CHILLS WITH OR WITHOUT FEVER  NAUSEA AND VOMITING THAT IS NOT CONTROLLED WITH YOUR NAUSEA MEDICATION  *UNUSUAL SHORTNESS OF BREATH  *UNUSUAL BRUISING OR BLEEDING  TENDERNESS IN MOUTH AND THROAT WITH OR WITHOUT PRESENCE OF ULCERS  *URINARY PROBLEMS  *BOWEL PROBLEMS  UNUSUAL RASH Items with * indicate a potential emergency and should be followed up as soon as possible.  Feel free to call the clinic you have any questions or concerns. The clinic phone number is [phone].  Please show the CHEMO ALERT CARD at check-in to the Emergency Department and triage nurse.   

## 2017-01-02 NOTE — Assessment & Plan Note (Signed)
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 Adjuvant chemotherapy with TCHPX 6 cycles followed by Herceptin-Perjetamaintenance for one year started 02/22/2016 2. Followed by adjuvant radiation completed 08/13/2016 ---------------------------------------------------------------------------------------------------------------------------------------------------- Current treatment: Completed 6 cycles ofTCHP and today she receives maintenance herceptin and Perjeta  Monitoring for chronic toxicities of chemotherapy 1. Severe fatigue: Discussed with her about different exercises that she can do to improve the fatigue. 2. Lack of sexual desire:Counseled Adjuvant radiation completed 08/13/2016  Rash on the chest and back  Patient will need echocardiograms every 3 months. RTC in 3 weeks for herceptin And Perjeta and 6 weeks for MD visit

## 2017-01-02 NOTE — Progress Notes (Signed)
Patient Care Team: Jearld Fenton, NP as PCP - General (Internal Medicine) Servando Salina, MD as Consulting Physician (Obstetrics and Gynecology) Nicholas Lose, MD as Consulting Physician (Hematology and Oncology) Delice Bison Charlestine Massed, NP as Nurse Practitioner (Hematology and Oncology)  DIAGNOSIS:  Encounter Diagnosis  Name Primary?  . Malignant neoplasm of upper-outer quadrant of left breast in female, estrogen receptor negative ([place])     SUMMARY OF ONCOLOGIC HISTORY:   Malignant neoplasm of upper-outer quadrant of left breast in female, estrogen receptor negative ([place])   12/19/2015 Initial Diagnosis    Left breast biopsy: IDC with DCI S, grade 3, ER 0%, PR 0%, KI67 40%, her 2 positive ratio 6.15, copy number 16.5; palpable lump left breast 1.6 cm irregular oval mass, T1cN0 stage I a clinical stage      01/17/2016 Surgery    Left Lumpectomy: IDC 4.1 cm with DCIS, Margin 0.1 cm, 1/3 LN Positive, ER/PR Neg, Her 2 Pos with Ki 67 of 40%, T2N1 (stage 2B)      02/22/2016 -  Chemotherapy    Adjuvant chemotherapy with TCH Perjeta 6 cycles followed by Herceptin Perjeta maintenance       07/02/2016 - 08/13/2016 Radiation Therapy    Adjuvant radiation therapy       CHIEF COMPLIANT: Follow-up of Herceptin and Perjeta, last cycle  INTERVAL HISTORY: Brianna James is a 36 year old with above-mentioned history of the left breast cancer treated with lumpectomy adjuvant chemotherapy and is currently on Herceptin Perjeta maintenance. She is tolerating the treatment fairly well. She reports no new problems or concerns. Does not have any nausea vomiting. He complains of intermittent diarrhea. She also has acneiform rash on the face and chest and back. She is interested in getting pregnant and wants to know when she can get pregnant.She is also interested in finding out more about Neratinib.  REVIEW OF SYSTEMS:   Constitutional: Denies fevers, chills or abnormal weight loss Eyes:  Denies blurriness of vision Ears, nose, mouth, throat, and face: acneiform eruption on the face and neck Respiratory: Denies cough, dyspnea or wheezes Cardiovascular: Denies palpitation, chest discomfort Gastrointestinal:  Denies nausea, heartburn or change in bowel habits Skin: Denies abnormal skin rashes Lymphatics: Denies new lymphadenopathy or easy bruising Neurological:Denies numbness, tingling or new weaknesses Behavioral/Psych: Mood is stable, no new changes  Extremities: No lower extremity edema  All other systems were reviewed with the patient and are negative.  I have reviewed the past medical history, past surgical history, social history and family history with the patient and they are unchanged from previous note.  ALLERGIES:  is allergic to other; oxycodone; and peanuts [peanut oil].  MEDICATIONS:  Current Outpatient Prescriptions  Medication Sig Dispense Refill  . cephALEXin (KEFLEX) 500 MG capsule Take 1 capsule (500 mg total) by mouth 3 (three) times daily. 21 capsule 0  . clindamycin (CLINDAGEL) 1 % gel Apply topically 2 (two) times daily. 30 g 0  . lidocaine-prilocaine (EMLA) cream Apply 1 application topically as needed. 30 g 0  . Pertuzumab (PERJETA IV) Inject into the vein.    . Prenatal Multivit-Min-Fe-FA (PRENATAL VITAMINS PO) Take 2 tablets by mouth.    . Trastuzumab (HERCEPTIN IV) Inject into the vein.    Marland Kitchen triamcinolone cream (KENALOG) 0.5 % Apply 1 application topically 2 (two) times daily. 30 g 0   No current facility-administered medications for this visit.    Facility-Administered Medications Ordered in Other Visits  Medication Dose Route Frequency Provider Last Rate Last Dose  .  heparin lock flush 100 unit/mL  500 Units Intracatheter Once PRN Nicholas Lose, MD      . pertuzumab (PERJETA) 420 mg in sodium chloride 0.9 % 250 mL chemo infusion  420 mg Intravenous Once Nicholas Lose, MD      . sodium chloride flush (NS) 0.9 % injection 10 mL  10 mL  Intracatheter PRN Nicholas Lose, MD      . trastuzumab (HERCEPTIN) 600 mg in sodium chloride 0.9 % 250 mL chemo infusion  600 mg Intravenous Once Nicholas Lose, MD        PHYSICAL EXAMINATION: ECOG PERFORMANCE STATUS: 1 - Symptomatic but completely ambulatory  Vitals:   01/02/17 0936  BP: 120/77  Pulse: 80  Resp: 20  Temp: 98.4 F (36.9 C)  SpO2: 99%   Filed Weights   01/02/17 0936  Weight: 232 lb (105.2 kg)    GENERAL:alert, no distress and comfortable SKIN: acne-like skin rash on the face and neck EYES: normal, Conjunctiva are pink and non-injected, sclera clear OROPHARYNX:no exudate, no erythema and lips, buccal mucosa, and tongue normal  NECK: supple, thyroid normal size, non-tender, without nodularity LYMPH:  no palpable lymphadenopathy in the cervical, axillary or inguinal LUNGS: clear to auscultation and percussion with normal breathing effort HEART: regular rate & rhythm and no murmurs and no lower extremity edema ABDOMEN:abdomen soft, non-tender and normal bowel sounds MUSCULOSKELETAL:no cyanosis of digits and no clubbing  NEURO: alert & oriented x 3 with fluent speech, no focal motor/sensory deficits EXTREMITIES: No lower extremity edema   LABORATORY DATA:  I have reviewed the data as listed   Chemistry      Component Value Date/Time   NA 139 01/02/2017 0920   K 4.4 01/02/2017 0920   CL 98 (L) 02/29/2016 0148   CO2 27 01/02/2017 0920   BUN 8.7 01/02/2017 0920   CREATININE 1.0 01/02/2017 0920      Component Value Date/Time   CALCIUM 9.0 01/02/2017 0920   ALKPHOS 81 01/02/2017 0920   AST 19 01/02/2017 0920   ALT 7 01/02/2017 0920   BILITOT 0.39 01/02/2017 0920       Lab Results  Component Value Date   WBC 6.6 01/02/2017   HGB 11.2 (L) 01/02/2017   HCT 36.0 01/02/2017   MCV 77.8 (L) 01/02/2017   PLT 259 01/02/2017   NEUTROABS 4.1 01/02/2017    ASSESSMENT & PLAN:  Malignant neoplasm of upper-outer quadrant of left breast in female, estrogen  receptor negative ([place]) 01/17/16: Left Lumpectomy: 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 Adjuvant chemotherapy with TCHPX 6 cycles followed by Herceptin-Perjetamaintenance for one year started 02/22/2016 2. Followed by adjuvant radiation completed 08/13/2016 ---------------------------------------------------------------------------------------------------------------------------------------------------- Current treatment: Completed 6 cycles ofTCHP and today she receives maintenance herceptin and Perjeta  Monitoring for chronic toxicities of chemotherapy 1. Severe fatigue: Discussed with her about different exercises that she can do to improve the fatigue. 2. Lack of sexual desire:Counseled Adjuvant radiation completed 08/13/2016  Rash on the face neck and chest: I given a prescription for clindamycin topical antibiotic ointment.  She completed 1 of Herceptin Perjeta therapy. I sent a message to Dr. Barry Dienes to get her port out.   I spent 25 minutes talking to the patient of which more than half was spent in counseling and coordination of care.  Orders Placed This Encounter  Procedures  . MM DIAG BREAST TOMO BILATERAL  Standing Status:   Future    Standing Expiration Date:   01/02/2018    Order Specific Question:   Reason for Exam (SYMPTOM  OR DIAGNOSIS REQUIRED)    Answer:   Breast cancer diagnostic mammograms    Order Specific Question:   Is the patient pregnant?    Answer:   No    Order Specific Question:   Preferred imaging location?    Answer:   External    Comments:   [facility]   The patient has a good understanding of the overall plan. she agrees with it. she will call with any problems that may develop before the next visit here.   Rulon Eisenmenger, MD 01/02/17

## 2017-01-09 ENCOUNTER — Ambulatory Visit (HOSPITAL_COMMUNITY)
Admission: RE | Admit: 2017-01-09 | Discharge: 2017-01-09 | Disposition: A | Discharge status: Home / Self Care | Payer: 59 | Source: Ambulatory Visit | Attending: Internal Medicine | Admitting: Internal Medicine

## 2017-01-09 VITALS — BP 124/70 | HR 62 | Wt 230.0 lb

## 2017-01-09 DIAGNOSIS — Z171 Estrogen receptor negative status [ER-]: Secondary | ICD-10-CM

## 2017-01-09 DIAGNOSIS — C50412 Malignant neoplasm of upper-outer quadrant of left female breast: Secondary | ICD-10-CM

## 2017-01-09 DIAGNOSIS — E669 Obesity, unspecified: Secondary | ICD-10-CM | POA: Diagnosis not present

## 2017-01-09 NOTE — Progress Notes (Signed)
CARDIO-ONCOLOGY CLINIC  NOTE  Referring Physician: Lindi Adie   HPI: Brianna James is a65 y/o woman with h/o obesity and left breast cancer (ER/PR negative, HER-2 +) diagnosed 10/17 referred to [facility] by Dr. Lindi Adie.   Denies any h/o known cardiac disease.   Oncology History Malignant neoplasm of upper-outer quadrant of left breast in female, estrogen receptor negative ([place])   12/19/2015 Initial Diagnosis    Left breast biopsy: IDC with DCI S, grade 3, ER 0%, PR 0%, KIC system 40%, her 2 positive ratio 6.15, copy number 16.5; palpable lump left breast 1.6 cm irregular oval mass, T1cN0 stage I a clinical stage      01/17/2016 Surgery    Left Lumpectomy: IDC 4.1 cm with DCIS, Margin 0.1 cm, 1/3 LN Positive, ER/PR Neg, Her 2 Pos with Ki 67 of 40%, T2N1 (stage 2B)      02/22/2016 -  Chemotherapy    Adjuvant chemotherapy with TCH Perjeta 6 cycles followed by Herceptin Perjeta maintenance       07/02/2016 - 08/13/2016 Radiation Therapy    Adjuvant radiation therapy      She returns today for cardio-oncology follow up. She completed radiation therapy on 08/13/16. Just completed Herceptin-Perjeta x 12 months last week. Feels good. Waiting to get port out. No CP or SOB. No edema. Not exercising much.   Echo 01/09/17: EF 55-60% LS' 10.6 GLS 18.0 Personally reviewed    Echo 02/20/16: EF 60-65% Lateral s' 12.1 cm/s. GLS -20.8% Echo 06/20/16 Personally reviewed 55-60% Lateral s' 10.6 GLS -18.5% Echo 09/25/16: Personally reviewed EF 60%  EF  Lateral s' 10.6  GLS -18.8  Past Medical History:  Diagnosis Date  . Cancer ([place]) 01/2016   breast  . Chicken pox   . Hematoma    left breast  . History of radiation therapy 07/02/16- 08/13/16   Left Breast 50 Gy in 25 fractions, Left Breast Boost 10 Gy in 5 fractions, Left Axilla 45 Gy in 25 fractions.   Marland Kitchen HSV infection   . PONV (postoperative nausea and vomiting)     Current Outpatient Prescriptions  Medication Sig  Dispense Refill  . clindamycin (CLINDAGEL) 1 % gel Apply topically 2 (two) times daily. 30 g 0  . lidocaine-prilocaine (EMLA) cream Apply 1 application topically as needed. 30 g 0  . Prenatal Multivit-Min-Fe-FA (PRENATAL VITAMINS PO) Take 2 tablets by mouth.    . triamcinolone cream (KENALOG) 0.5 % Apply 1 application topically 2 (two) times daily. 30 g 0   No current facility-administered medications for this encounter.     Allergies  Allergen Reactions  . Other Swelling    Magic Mouthwash w/ lidocaine  . Oxycodone Nausea And Vomiting    Pt reports adverse reaction.   . Peanuts [Peanut Oil] Hives      Social History   Social History  . Marital status: Married    Spouse name: N/A  . Number of children: N/A  . Years of education: N/A   Occupational History  . Not on file.   Social History Main Topics  . Smoking status: Never Smoker  . Smokeless tobacco: Never Used  . Alcohol use 0.0 oz/week     Comment: rare - wine maybe 2x per yr  . Drug use: No  . Sexual activity: Yes    Birth control/ protection: None   Other Topics Concern  . Not on file   Social History Narrative  . No narrative on file      Family History  Problem Relation Age of Onset  . Arthritis Mother   . Stroke Mother   . Stroke Maternal Aunt   . Stroke Maternal Uncle   . Breast cancer Maternal Grandmother        dx at 19 or younger; d. 50y  . Lung cancer Maternal Grandfather   . Stroke Paternal Grandmother   . Uterine cancer Paternal Grandmother        dx. 61-62  . Other Paternal Grandmother        colostomy bag in her 35s; unspecified reason  . Lung cancer Paternal Grandfather   . Uterine cancer Maternal Aunt        dx unspecified age  . Breast cancer Other        maternal great grandmother (MGM's mother) d. 55    Vitals:   01/09/17 1010  BP: 124/70  Pulse: 62  SpO2: 97%  Weight: 230 lb (104.3 kg)    PHYSICAL EXAM: General:  Well appearing. No resp difficulty HEENT:  normal Neck: supple. no JVD. Carotids 2+ bilat; no bruits. No lymphadenopathy or thryomegaly appreciated. RIJ port Cor: PMI nondisplaced. Regular rate & rhythm. No rubs, gallops or murmurs. Lungs: clear Abdomen: soft, nontender, nondistended. No hepatosplenomegaly. No bruits or masses. Good bowel sounds. Extremities: no cyanosis, clubbing, rash, edema Neuro: alert & orientedx3, cranial nerves grossly intact. moves all 4 extremities w/o difficulty. Affect pleasant   ASSESSMENT & PLAN:  1. Breast cancer of upper-outer quadrant of left female breast ([place]) --01/17/16: Left Lumpectomy: IDC 4.1 cm with DCIS, Margin 0.1 cm, 1/3 LN Positive, ER/PR Neg, Her 2 Pos with Ki 67 of 40%, T2N1 (stage 2B) - Has complete Adjuvant chemotherapy with TCHP X 6 cycles and 1 year of herceptin/perjeta 10/18 - I reviewed echos personally. EF and Doppler parameters stable. No HF on exam. Follow-up PRN    Glori Bickers, MD  10:35 AM

## 2017-01-09 NOTE — Progress Notes (Signed)
  Echocardiogram 2D Echocardiogram has been performed.  Tresa Res 01/09/2017, 10:11 AM

## 2017-01-09 NOTE — Patient Instructions (Signed)
Follow up as needed

## 2017-01-14 ENCOUNTER — Encounter: Payer: Self-pay | Admitting: Family Medicine

## 2017-01-14 ENCOUNTER — Ambulatory Visit (INDEPENDENT_AMBULATORY_CARE_PROVIDER_SITE_OTHER): Payer: 59 | Admitting: Family Medicine

## 2017-01-14 DIAGNOSIS — L089 Local infection of the skin and subcutaneous tissue, unspecified: Secondary | ICD-10-CM | POA: Diagnosis not present

## 2017-01-14 DIAGNOSIS — T148XXA Other injury of unspecified body region, initial encounter: Secondary | ICD-10-CM | POA: Diagnosis not present

## 2017-01-14 MED ORDER — DOXYCYCLINE HYCLATE 100 MG PO TABS: 100.0000 mg | ORAL_TABLET | Freq: Two times a day (BID) | ORAL | 0 refills | Status: DC

## 2017-01-14 NOTE — Progress Notes (Signed)
Done with cancer treatment.  Had nail changes with chemo prev.    L 1st nail change.  S/p infection treated with keflex.  She didn't have pus draining but had some occ bleeding from under the nail. She has been soaking her foot and using a bandaid.  Now with a blister under the front of the nail, new in the last week.  Locally tender.  D/w pt about options.    Meds, vitals, and allergies reviewed.   ROS: Per HPI unless specifically indicated in ROS section   nad ncat L foot with 1st toe with blister under the distal nail; the nail has chronic changes noted.  D/w pt about options.  Area cleaned with etoh.  Small puncture with 25g needle confirmed pus draining from blister.  Not painful- only superficial drainage and didn't need anesthesia.  The hole sealed over and recleaned and repunctured with 18g needle, it easily drained pus, w/o pain.  Fully decompressed. Tolerated well.

## 2017-01-14 NOTE — Patient Instructions (Addendum)
Start doxy- sunburn caution.  Soak the foot daily, otherwise keep clean and covered. Expect some drainage for a few days.  Let the nail grow out.   Take care.  Glad to see you.  Update [place] as needed.

## 2017-01-15 DIAGNOSIS — T148XXA Other injury of unspecified body region, initial encounter: Principal | ICD-10-CM

## 2017-01-15 DIAGNOSIS — L089 Local infection of the skin and subcutaneous tissue, unspecified: Secondary | ICD-10-CM | POA: Insufficient documentation

## 2017-01-15 NOTE — Assessment & Plan Note (Addendum)
This may have been related to her chronic nail changes. Pus expressed. She felt better immediately after this was done. Tolerated procedure well. No complications. Routine cautions given. Wash with soap and water. Soak foot daily. Bandage otherwise. It may continue to drain some purulent material over the next few days. Start doxycycline. Update [place] as needed. She agrees. No erythema spreading up the foot.

## 2017-01-17 ENCOUNTER — Other Ambulatory Visit: Payer: Self-pay | Admitting: Hematology and Oncology

## 2017-02-11 DIAGNOSIS — Z853 Personal history of malignant neoplasm of breast: Secondary | ICD-10-CM | POA: Diagnosis not present

## 2017-02-11 DIAGNOSIS — R928 Other abnormal and inconclusive findings on diagnostic imaging of breast: Secondary | ICD-10-CM | POA: Diagnosis not present

## 2017-02-21 ENCOUNTER — Other Ambulatory Visit: Payer: Self-pay | Admitting: Hematology and Oncology

## 2017-02-26 ENCOUNTER — Other Ambulatory Visit: Payer: Self-pay

## 2017-02-26 ENCOUNTER — Encounter (HOSPITAL_COMMUNITY): Payer: Self-pay | Admitting: *Deleted

## 2017-02-26 NOTE — Progress Notes (Signed)
Spoke with pt for pre-op call. Pt denies cardiac history, chest pain or sob. Pt states she is not diabetic.  

## 2017-02-27 ENCOUNTER — Ambulatory Visit (HOSPITAL_COMMUNITY): Payer: 59 | Admitting: Emergency Medicine

## 2017-02-27 ENCOUNTER — Encounter (HOSPITAL_COMMUNITY)
Admission: RE | Disposition: A | Discharge status: Home / Self Care | Payer: Self-pay | Source: Ambulatory Visit | Attending: General Surgery

## 2017-02-27 ENCOUNTER — Ambulatory Visit (HOSPITAL_COMMUNITY)
Admission: RE | Admit: 2017-02-27 | Discharge: 2017-02-27 | Disposition: A | Discharge status: Home / Self Care | Payer: 59 | Source: Ambulatory Visit | Attending: General Surgery | Admitting: General Surgery

## 2017-02-27 ENCOUNTER — Encounter (HOSPITAL_COMMUNITY): Payer: Self-pay | Admitting: Certified Registered Nurse Anesthetist

## 2017-02-27 DIAGNOSIS — Z8261 Family history of arthritis: Secondary | ICD-10-CM | POA: Insufficient documentation

## 2017-02-27 DIAGNOSIS — Z8049 Family history of malignant neoplasm of other genital organs: Secondary | ICD-10-CM | POA: Diagnosis not present

## 2017-02-27 DIAGNOSIS — Z823 Family history of stroke: Secondary | ICD-10-CM | POA: Insufficient documentation

## 2017-02-27 DIAGNOSIS — Z808 Family history of malignant neoplasm of other organs or systems: Secondary | ICD-10-CM | POA: Diagnosis not present

## 2017-02-27 DIAGNOSIS — K219 Gastro-esophageal reflux disease without esophagitis: Secondary | ICD-10-CM | POA: Insufficient documentation

## 2017-02-27 DIAGNOSIS — C50412 Malignant neoplasm of upper-outer quadrant of left female breast: Secondary | ICD-10-CM | POA: Diagnosis not present

## 2017-02-27 DIAGNOSIS — Z803 Family history of malignant neoplasm of breast: Secondary | ICD-10-CM | POA: Insufficient documentation

## 2017-02-27 DIAGNOSIS — Z853 Personal history of malignant neoplasm of breast: Secondary | ICD-10-CM | POA: Diagnosis not present

## 2017-02-27 DIAGNOSIS — D649 Anemia, unspecified: Secondary | ICD-10-CM | POA: Diagnosis not present

## 2017-02-27 DIAGNOSIS — Z923 Personal history of irradiation: Secondary | ICD-10-CM | POA: Insufficient documentation

## 2017-02-27 DIAGNOSIS — Z452 Encounter for adjustment and management of vascular access device: Secondary | ICD-10-CM | POA: Diagnosis not present

## 2017-02-27 HISTORY — DX: Gastro-esophageal reflux disease without esophagitis: K21.9

## 2017-02-27 HISTORY — DX: Anemia, unspecified: D64.9

## 2017-02-27 HISTORY — PX: PORT-A-CATH REMOVAL: SHX5289

## 2017-02-27 LAB — BASIC METABOLIC PANEL
Anion gap: 9 (ref 5–15)
BUN: 5 mg/dL — AB (ref 6–20)
CHLORIDE: 104 mmol/L (ref 101–111)
CO2: 22 mmol/L (ref 22–32)
CREATININE: 0.97 mg/dL (ref 0.44–1.00)
Calcium: 8.7 mg/dL — ABNORMAL LOW (ref 8.9–10.3)
GFR calc Af Amer: >60 mL/min (ref >60)
GLUCOSE: 88 mg/dL (ref 65–99)
POTASSIUM: 3.9 mmol/L (ref 3.5–5.1)
SODIUM: 135 mmol/L (ref 135–145)

## 2017-02-27 LAB — CBC
HEMATOCRIT: 35.4 % — AB (ref 36.0–46.0)
Hemoglobin: 11.3 g/dL — ABNORMAL LOW (ref 12.0–15.0)
MCH: 24.8 pg — ABNORMAL LOW (ref 26.0–34.0)
MCHC: 31.9 g/dL (ref 30.0–36.0)
MCV: 77.8 fL — AB (ref 78.0–100.0)
PLATELETS: 247 10*3/uL (ref 150–400)
RBC: 4.55 MIL/uL (ref 3.87–5.11)
RDW: 15.7 % — AB (ref 11.5–15.5)
WBC: 8 10*3/uL (ref 4.0–10.5)

## 2017-02-27 LAB — HCG, SERUM, QUALITATIVE: PREG SERUM: NEGATIVE

## 2017-02-27 SURGERY — REMOVAL PORT-A-CATH
Anesthesia: Monitor Anesthesia Care | Site: Chest | Laterality: Right

## 2017-02-27 MED ORDER — LIDOCAINE HCL 1 % IJ SOLN
INTRAMUSCULAR | Status: DC | PRN
  Administered 2017-02-27: 10 mL via INTRAMUSCULAR

## 2017-02-27 MED ORDER — CEFAZOLIN SODIUM-DEXTROSE 2-4 GM/100ML-% IV SOLN
INTRAVENOUS | Status: AC
  Filled 2017-02-27: qty 100

## 2017-02-27 MED ORDER — LIDOCAINE HCL (PF) 1 % IJ SOLN
INTRAMUSCULAR | Status: AC
  Filled 2017-02-27: qty 30

## 2017-02-27 MED ORDER — MIDAZOLAM HCL 2 MG/2ML IJ SOLN
1.0000 mg | INTRAMUSCULAR | Status: DC | PRN
Start: 2017-02-27 — End: 2017-02-27

## 2017-02-27 MED ORDER — MIDAZOLAM HCL 5 MG/5ML IJ SOLN
INTRAMUSCULAR | Status: DC | PRN
  Administered 2017-02-27: 2 mg via INTRAVENOUS

## 2017-02-27 MED ORDER — ONDANSETRON HCL 4 MG/2ML IJ SOLN
INTRAMUSCULAR | Status: DC | PRN
  Administered 2017-02-27: 4 mg via INTRAVENOUS

## 2017-02-27 MED ORDER — HYDROCODONE-ACETAMINOPHEN 5-325 MG PO TABS: 1.0000 | ORAL_TABLET | ORAL | 0 refills | Status: DC | PRN

## 2017-02-27 MED ORDER — MIDAZOLAM HCL 2 MG/2ML IJ SOLN
INTRAMUSCULAR | Status: AC
  Filled 2017-02-27: qty 2

## 2017-02-27 MED ORDER — PROPOFOL 500 MG/50ML IV EMUL
INTRAVENOUS | Status: DC | PRN
  Administered 2017-02-27: 130 ug/kg/min via INTRAVENOUS

## 2017-02-27 MED ORDER — FENTANYL CITRATE (PF) 250 MCG/5ML IJ SOLN
INTRAMUSCULAR | Status: AC
  Filled 2017-02-27: qty 5

## 2017-02-27 MED ORDER — FENTANYL CITRATE (PF) 100 MCG/2ML IJ SOLN
INTRAMUSCULAR | Status: DC | PRN
  Administered 2017-02-27: 50 ug via INTRAVENOUS

## 2017-02-27 MED ORDER — CEFAZOLIN SODIUM-DEXTROSE 2-4 GM/100ML-% IV SOLN
2.0000 g | INTRAVENOUS | Status: AC
  Administered 2017-02-27: 2 g via INTRAVENOUS

## 2017-02-27 MED ORDER — DEXAMETHASONE SODIUM PHOSPHATE 10 MG/ML IJ SOLN
INTRAMUSCULAR | Status: DC | PRN
  Administered 2017-02-27: 10 mg via INTRAVENOUS

## 2017-02-27 MED ORDER — PROPOFOL 10 MG/ML IV BOLUS
INTRAVENOUS | Status: AC
  Filled 2017-02-27: qty 20

## 2017-02-27 MED ORDER — BUPIVACAINE-EPINEPHRINE (PF) 0.25% -1:200000 IJ SOLN
INTRAMUSCULAR | Status: AC
  Filled 2017-02-27: qty 30

## 2017-02-27 MED ORDER — PROPOFOL 10 MG/ML IV BOLUS
INTRAVENOUS | Status: DC | PRN
  Administered 2017-02-27 (×2): 15 mg via INTRAVENOUS
  Administered 2017-02-27: 20 mg via INTRAVENOUS

## 2017-02-27 MED ORDER — 0.9 % SODIUM CHLORIDE (POUR BTL) OPTIME
TOPICAL | Status: DC | PRN
  Administered 2017-02-27: 1000 mL

## 2017-02-27 MED ORDER — FENTANYL CITRATE (PF) 100 MCG/2ML IJ SOLN: 25.0000 ug | INTRAMUSCULAR | Status: DC | PRN

## 2017-02-27 MED ORDER — FENTANYL CITRATE (PF) 100 MCG/2ML IJ SOLN
25.0000 ug | INTRAMUSCULAR | Status: DC | PRN
Start: 2017-02-27 — End: 2017-02-27

## 2017-02-27 MED ORDER — LACTATED RINGERS IV SOLN
INTRAVENOUS | Status: DC
  Administered 2017-02-27: 10:00:00 via INTRAVENOUS

## 2017-02-27 SURGICAL SUPPLY — 32 items
BLADE SURG 15 STRL LF DISP TIS (BLADE) ×1 IMPLANT
BLADE SURG 15 STRL SS (BLADE) ×2
CHLORAPREP W/TINT 10.5 ML (MISCELLANEOUS) ×3 IMPLANT
COVER SURGICAL LIGHT HANDLE (MISCELLANEOUS) ×3 IMPLANT
DECANTER SPIKE VIAL GLASS SM (MISCELLANEOUS) ×6 IMPLANT
DERMABOND ADVANCED (GAUZE/BANDAGES/DRESSINGS) ×2
DERMABOND ADVANCED .7 DNX12 (GAUZE/BANDAGES/DRESSINGS) ×1 IMPLANT
DRAPE LAPAROTOMY 100X72 PEDS (DRAPES) ×3 IMPLANT
DRAPE UTILITY XL STRL (DRAPES) ×6 IMPLANT
ELECT CAUTERY BLADE 6.4 (BLADE) ×3 IMPLANT
ELECT REM PT RETURN 9FT ADLT (ELECTROSURGICAL) ×3
ELECTRODE REM PT RTRN 9FT ADLT (ELECTROSURGICAL) ×1 IMPLANT
GAUZE SPONGE 4X4 16PLY XRAY LF (GAUZE/BANDAGES/DRESSINGS) ×3 IMPLANT
GLOVE BIO SURGEON STRL SZ 6 (GLOVE) ×3 IMPLANT
GLOVE BIOGEL PI IND STRL 6.5 (GLOVE) ×1 IMPLANT
GLOVE BIOGEL PI INDICATOR 6.5 (GLOVE) ×2
GOWN STRL REUS W/ TWL LRG LVL3 (GOWN DISPOSABLE) ×1 IMPLANT
GOWN STRL REUS W/TWL 2XL LVL3 (GOWN DISPOSABLE) ×3 IMPLANT
GOWN STRL REUS W/TWL LRG LVL3 (GOWN DISPOSABLE) ×2
KIT BASIN OR (CUSTOM PROCEDURE TRAY) ×3 IMPLANT
KIT ROOM TURNOVER OR (KITS) ×3 IMPLANT
NEEDLE HYPO 25GX1X1/2 BEV (NEEDLE) ×3 IMPLANT
NS IRRIG 1000ML POUR BTL (IV SOLUTION) ×3 IMPLANT
PACK SURGICAL SETUP 50X90 (CUSTOM PROCEDURE TRAY) ×3 IMPLANT
PAD ARMBOARD 7.5X6 YLW CONV (MISCELLANEOUS) ×6 IMPLANT
PENCIL BUTTON HOLSTER BLD 10FT (ELECTRODE) ×3 IMPLANT
SUT MON AB 4-0 PC3 18 (SUTURE) ×3 IMPLANT
SUT VIC AB 3-0 SH 27 (SUTURE) ×2
SUT VIC AB 3-0 SH 27X BRD (SUTURE) ×1 IMPLANT
SYR CONTROL 10ML LL (SYRINGE) ×3 IMPLANT
TOWEL OR 17X24 6PK STRL BLUE (TOWEL DISPOSABLE) ×3 IMPLANT
TOWEL OR 17X26 10 PK STRL BLUE (TOWEL DISPOSABLE) ×3 IMPLANT

## 2017-02-27 NOTE — H&P (Signed)
Brianna James is an 36 y.o. female.   Chief Complaint: left breast cancer HPI: Pt is s/p treatment for left breast cancer.  She is done with chemo and desires port removal.    Past Medical History:  Diagnosis Date  . Anemia    low iron  . Cancer ([place]) 01/2016   breast - left  . Chicken pox   . GERD (gastroesophageal reflux disease)    during pregnancy  . Hematoma    left breast  . History of radiation therapy 07/02/16- 08/13/16   Left Breast 50 Gy in 25 fractions, Left Breast Boost 10 Gy in 5 fractions, Left Axilla 45 Gy in 25 fractions.   Marland Kitchen HSV infection   . PONV (postoperative nausea and vomiting)     Past Surgical History:  Procedure Laterality Date  . BREAST LUMPECTOMY WITH RADIOACTIVE SEED AND SENTINEL LYMPH NODE BIOPSY Left 01/17/2016   Procedure: LEFT BREAST LUMPECTOMY WITH RADIOACTIVE SEED AND SENTINEL LYMPH NODE BIOPSY;  Surgeon: [name], MD;  Location: [facility];  Service: General;  Laterality: Left;  . EVACUATION BREAST HEMATOMA Left 01/25/2016   Procedure: WASHOUT LEFT BREAST HEMATOMA;  Surgeon: [name], MD;  Location: [facility];  Service: General;  Laterality: Left;  . PORTACATH PLACEMENT Right 01/17/2016   Procedure: INSERTION PORT-A-CATH WITH [place];  Surgeon: [name], MD;  Location: [place] [facility];  Service: General;  Laterality: Right;  . WISDOM TOOTH EXTRACTION      Family History  Problem Relation Age of Onset  . Arthritis Mother   . Stroke Mother   . Stroke Maternal Aunt   . Stroke Maternal Uncle   . Breast cancer Maternal Grandmother        dx at 91 or younger; d. 13y  . Lung cancer Maternal Grandfather   . Stroke Paternal Grandmother   . Uterine cancer Paternal Grandmother        dx. 61-62  . Other Paternal Grandmother        colostomy bag in her 56s; unspecified reason  . Lung cancer Paternal Grandfather   . Uterine cancer Maternal Aunt        dx unspecified age  . Breast cancer Other         maternal great grandmother (MGM's mother) d. 46   Social History:  reports that  has never smoked. she has never used smokeless tobacco. She reports that she drinks alcohol. She reports that she does not use drugs.  Allergies:  Allergies  Allergen Reactions  . Other Swelling    Magic Mouthwash w/ lidocaine  . Oxycodone Nausea And Vomiting    Pt reports adverse reaction.   . Peanuts [Peanut Oil] Hives    Medications Prior to Admission  Medication Sig Dispense Refill  . clindamycin (CLINDAGEL) 1 % gel APPLY TO AFFECTED AREA TWICE A DAY 30 g 0  . OVER THE COUNTER MEDICATION Take by mouth at bedtime as needed. Night Time Flu and Severe Cough and Cold Medicine    . Pheniramine-PE-APAP (THERAFLU FLU & SORE THROAT) 20-10-650 MG PACK Take by mouth every 4 (four) hours as needed (for cold symptoms.).     Marland Kitchen Prenatal MV-Min-FA-Omega-3 (PRENATAL GUMMIES/DHA & FA PO) Take 2 tablets by mouth daily.    Marland Kitchen doxycycline (VIBRA-TABS) 100 MG tablet Take 1 tablet (100 mg total) by mouth 2 (two) times daily. (Patient not taking: Reported on 02/21/2017) 20 tablet 0    Results for orders placed or performed during  the hospital encounter of 02/27/17 (from the past 48 hour(s))  Basic metabolic panel     Status: Abnormal   Collection Time: 02/27/17  9:25 AM  Result Value Ref Range   Sodium 135 135 - 145 mmol/L   Potassium 3.9 3.5 - 5.1 mmol/L   Chloride 104 101 - 111 mmol/L   CO2 22 22 - 32 mmol/L   Glucose, Bld 88 65 - 99 mg/dL   BUN 5 (L) 6 - 20 mg/dL   Creatinine, Ser 0.97 0.44 - 1.00 mg/dL   Calcium 8.7 (L) 8.9 - 10.3 mg/dL   GFR calc non Af Amer >60 >60 mL/min   GFR calc Af Amer >60 >60 mL/min    Comment: (NOTE) The eGFR has been calculated using the CKD EPI equation. This calculation has not been validated in all clinical situations. eGFR's persistently <60 mL/min signify possible Chronic Kidney Disease.    Anion gap 9 5 - 15  CBC     Status: Abnormal   Collection Time: 02/27/17  9:25 AM   Result Value Ref Range   WBC 8.0 4.0 - 10.5 K/uL   RBC 4.55 3.87 - 5.11 MIL/uL   Hemoglobin 11.3 (L) 12.0 - 15.0 g/dL   HCT 35.4 (L) 36.0 - 46.0 %   MCV 77.8 (L) 78.0 - 100.0 fL   MCH 24.8 (L) 26.0 - 34.0 pg   MCHC 31.9 30.0 - 36.0 g/dL   RDW 15.7 (H) 11.5 - 15.5 %   Platelets 247 150 - 400 K/uL  hCG, serum, qualitative     Status: None   Collection Time: 02/27/17  9:47 AM  Result Value Ref Range   Preg, Serum NEGATIVE NEGATIVE    Comment:        THE SENSITIVITY OF THIS METHODOLOGY IS >10 mIU/mL.    No results found.  Review of Systems  All other systems reviewed and are negative.   Blood pressure 131/76, pulse 88, temperature 98.2 F (36.8 C), temperature source Oral, resp. rate 18, height '5\' 7"'  (1.702 m), weight 105.7 kg (233 lb), last menstrual period 02/18/2017, SpO2 98 %, unknown if currently breastfeeding. Physical Exam  Constitutional: She is oriented to person, place, and time. She appears well-developed and well-nourished. No distress.  HENT:  Head: Normocephalic and atraumatic.  Eyes: Conjunctivae are normal. Pupils are equal, round, and reactive to light.  Neck: Normal range of motion. Neck supple.  Cardiovascular: Normal rate.  Respiratory: Effort normal. No respiratory distress.  GI: Soft.  Musculoskeletal: Normal range of motion.  Neurological: She is alert and oriented to person, place, and time.  Skin: Skin is warm and dry. No rash noted. She is not diaphoretic. No erythema. No pallor.  Psychiatric: She has a normal mood and affect. Her behavior is normal. Judgment and thought content normal.     Assessment/Plan Left breast cancer.  Plan port removal.    [name], MD 02/27/2017, 10:35 AM

## 2017-02-27 NOTE — Discharge Instructions (Signed)
[place] Office Phone Number [phone]   POST OP INSTRUCTIONS  Always review your discharge instruction sheet given to you by the facility where your surgery was performed.  IF YOU HAVE DISABILITY OR FAMILY LEAVE FORMS, YOU MUST BRING THEM TO THE OFFICE FOR PROCESSING.  DO NOT GIVE THEM TO YOUR DOCTOR.  1. A prescription for pain medication may be given to you upon discharge.  Take your pain medication as prescribed, if needed.  If narcotic pain medicine is not needed, then you may take acetaminophen (Tylenol) or ibuprofen (Advil) as needed. 2. Take your usually prescribed medications unless otherwise directed 3. If you need a refill on your pain medication, please contact your pharmacy.  They will contact our office to request authorization.  Prescriptions will not be filled after 5pm or on week-ends. 4. You should eat very light the first 24 hours after surgery, such as soup, crackers, pudding, etc.  Resume your normal diet the day after surgery 5. It is common to experience some constipation if taking pain medication after surgery.  Increasing fluid intake and taking a stool softener will usually help or prevent this problem from occurring.  A mild laxative (Milk of Magnesia or Miralax) should be taken according to package directions if there are no bowel movements after 48 hours. 6. You may shower in 48 hours.  The surgical glue will flake off in 2-3 weeks.   7. ACTIVITIES:  No strenuous activity or heavy lifting for 1 week.   a. You may drive when you no longer are taking prescription pain medication, you can comfortably wear a seatbelt, and you can safely maneuver your car and apply brakes. b. RETURN TO WORK:  __________1 week or less depending on pain control and ability to comply with lifting restrictions.  _______________ Dennis Bast should see your doctor in the office for a follow-up appointment approximately three-four weeks after your surgery.    WHEN TO CALL YOUR  DOCTOR: 1. Fever over 101.0 2. Nausea and/or vomiting. 3. Extreme swelling or bruising. 4. Continued bleeding from incision. 5. Increased pain, redness, or drainage from the incision.  The clinic staff is available to answer your questions during regular business hours.  Please dont hesitate to call and ask to speak to one of the nurses for clinical concerns.  If you have a medical emergency, go to the nearest emergency room or call 911.  A surgeon from [facility] is always on call at the hospital.  For further questions, please visit [email or web address]

## 2017-02-27 NOTE — Anesthesia Postprocedure Evaluation (Signed)
Anesthesia Post Note  Patient: Kenard Gower  Procedure(s) Performed: REMOVAL PORT-A-CATH ERAS PATHWAY (Right Chest)     Patient location during evaluation: PACU Anesthesia Type: MAC Level of consciousness: awake and alert Pain management: pain level controlled Vital Signs Assessment: post-procedure vital signs reviewed and stable Respiratory status: spontaneous breathing, nonlabored ventilation and respiratory function stable Cardiovascular status: stable and blood pressure returned to baseline Postop Assessment: no apparent nausea or vomiting Anesthetic complications: no    Last Vitals:  Vitals:   02/27/17 1202 02/27/17 1203  BP:  132/78  Pulse: 66 66  Resp: 13   Temp: 36.6 C   SpO2: 99% 100%    Last Pain:  Vitals:   02/27/17 1118  TempSrc:   PainSc: 0-No pain                 Evangeline Utley,W. EDMOND

## 2017-02-27 NOTE — Op Note (Signed)
  PRE-OPERATIVE DIAGNOSIS:  un-needed Port-A-Cath for left breast cancer  POST-OPERATIVE DIAGNOSIS:  Same   PROCEDURE:  Procedure(s):  REMOVAL PORT-A-CATH  SURGEON:  Surgeon(s):  [name], MD  ANESTHESIA:   MAC + local  EBL:   Minimal  SPECIMEN:  None  Complications : none known  Procedure:   Pt was  identified in the holding area and taken to the operating room where she was placed supine on the operating room table.  MAC anesthesia was induced.  The right upper chest was prepped and draped.  The prior incision was anesthetized with local anesthetic.  The incision was opened with a #15 blade.  The subcutaneous tissue was divided with the cautery.  The port was identified and the capsule opened.  The four 2-0 prolene sutures were removed.  The port was then removed and pressure held on the tract.  The catheter appeared intact without evidence of breakage, length was 22 cm.  The wound was inspected for hemostasis, which was achieved with cautery.  The wound was closed with 3-0 vicryl deep dermal interrupted sutures and 4-0 Monocryl running subcuticular suture.  The wound was cleaned, dried, and dressed with dermabond.  The patient was awakened from anesthesia and taken to the PACU in stable condition.  Needle, sponge, and instrument counts are correct.

## 2017-02-27 NOTE — Transfer of Care (Signed)
Immediate Anesthesia Transfer of Care Note  Patient: Brianna James  Procedure(s) Performed: REMOVAL PORT-A-CATH ERAS PATHWAY (Right Chest)  Patient Location: PACU  Anesthesia Type:MAC  Level of Consciousness: oriented and drowsy  Airway & Oxygen Therapy: Patient Spontanous Breathing and Patient connected to face mask oxygen  Post-op Assessment: Report given to RN and Post -op Vital signs reviewed and stable  Post vital signs: Reviewed and stable  Last Vitals:  Vitals:   02/27/17 0920  BP: 131/76  Pulse: 88  Resp: 18  Temp: 36.8 C  SpO2: 98%    Last Pain:  Vitals:   02/27/17 0920  TempSrc: Oral      Patients Stated Pain Goal: 2 (65/68/12 7517)  Complications: No apparent anesthesia complications

## 2017-02-27 NOTE — Anesthesia Procedure Notes (Signed)
Procedure Name: MAC Date/Time: 02/27/2017 11:02 AM Performed by: White, Amedeo Plenty, CRNA Pre-anesthesia Checklist: Patient identified, Emergency Drugs available, Suction available and Patient being monitored Patient Re-evaluated:Patient Re-evaluated prior to induction Oxygen Delivery Method: Simple face mask Preoxygenation: Pre-oxygenation with 100% oxygen Induction Type: IV induction

## 2017-02-27 NOTE — Anesthesia Preprocedure Evaluation (Addendum)
Anesthesia Evaluation  Patient identified by MRN, date of birth, ID band Patient awake    Reviewed: Allergy & Precautions, H&P , NPO status , Patient's Chart, lab work & pertinent test results  History of Anesthesia Complications (+) PONV  Airway Mallampati: II  TM Distance: >3 FB Neck ROM: Full    Dental no notable dental hx. (+) Teeth Intact, Dental Advisory Given   Pulmonary neg pulmonary ROS,    Pulmonary exam normal breath sounds clear to auscultation       Cardiovascular negative cardio ROS   Rhythm:Regular Rate:Normal     Neuro/Psych negative neurological ROS  negative psych ROS   GI/Hepatic Neg liver ROS, GERD  ,  Endo/Other  negative endocrine ROS  Renal/GU negative Renal ROS  negative genitourinary   Musculoskeletal   Abdominal   Peds  Hematology negative hematology ROS (+) anemia ,   Anesthesia Other Findings   Reproductive/Obstetrics negative OB ROS                           Anesthesia Physical Anesthesia Plan  ASA: II  Anesthesia Plan: MAC   Post-op Pain Management:    Induction: Intravenous  PONV Risk Score and Plan: 4 or greater and Ondansetron, Dexamethasone, Midazolam and Propofol infusion  Airway Management Planned: Simple Face Mask  Additional Equipment:   Intra-op Plan:   Post-operative Plan:   Informed Consent: I have reviewed the patients History and Physical, chart, labs and discussed the procedure including the risks, benefits and alternatives for the proposed anesthesia with the patient or authorized representative who has indicated his/her understanding and acceptance.   Dental advisory given  Plan Discussed with: CRNA  Anesthesia Plan Comments:         Anesthesia Quick Evaluation

## 2017-02-28 ENCOUNTER — Encounter (HOSPITAL_COMMUNITY): Payer: Self-pay | Admitting: General Surgery

## 2017-03-23 ENCOUNTER — Other Ambulatory Visit: Payer: Self-pay | Admitting: Hematology and Oncology

## 2017-04-03 ENCOUNTER — Ambulatory Visit (HOSPITAL_COMMUNITY)
Admission: RE | Admit: 2017-04-03 | Discharge: 2017-04-03 | Disposition: A | Discharge status: Home / Self Care | Payer: 59 | Source: Ambulatory Visit | Attending: Adult Health | Admitting: Adult Health

## 2017-04-03 ENCOUNTER — Telehealth: Payer: Self-pay | Admitting: Adult Health

## 2017-04-03 ENCOUNTER — Telehealth: Payer: Self-pay

## 2017-04-03 ENCOUNTER — Inpatient Hospital Stay: Payer: 59 | Attending: Adult Health | Admitting: Adult Health

## 2017-04-03 ENCOUNTER — Encounter: Payer: Self-pay | Admitting: Adult Health

## 2017-04-03 VITALS — BP 121/82 | HR 87 | Temp 98.5°F | Resp 20 | Ht 67.0 in | Wt 235.8 lb

## 2017-04-03 DIAGNOSIS — Z853 Personal history of malignant neoplasm of breast: Secondary | ICD-10-CM | POA: Insufficient documentation

## 2017-04-03 DIAGNOSIS — C50412 Malignant neoplasm of upper-outer quadrant of left female breast: Secondary | ICD-10-CM | POA: Insufficient documentation

## 2017-04-03 DIAGNOSIS — M79602 Pain in left arm: Secondary | ICD-10-CM | POA: Insufficient documentation

## 2017-04-03 DIAGNOSIS — M7989 Other specified soft tissue disorders: Secondary | ICD-10-CM

## 2017-04-03 DIAGNOSIS — Z171 Estrogen receptor negative status [ER-]: Secondary | ICD-10-CM | POA: Diagnosis not present

## 2017-04-03 DIAGNOSIS — L989 Disorder of the skin and subcutaneous tissue, unspecified: Secondary | ICD-10-CM | POA: Diagnosis not present

## 2017-04-03 DIAGNOSIS — M79669 Pain in unspecified lower leg: Secondary | ICD-10-CM | POA: Insufficient documentation

## 2017-04-03 DIAGNOSIS — Z9221 Personal history of antineoplastic chemotherapy: Secondary | ICD-10-CM | POA: Diagnosis not present

## 2017-04-03 DIAGNOSIS — Z923 Personal history of irradiation: Secondary | ICD-10-CM | POA: Insufficient documentation

## 2017-04-03 DIAGNOSIS — M542 Cervicalgia: Secondary | ICD-10-CM | POA: Diagnosis not present

## 2017-04-03 DIAGNOSIS — M79605 Pain in left leg: Secondary | ICD-10-CM | POA: Diagnosis not present

## 2017-04-03 DIAGNOSIS — M79662 Pain in left lower leg: Secondary | ICD-10-CM | POA: Diagnosis not present

## 2017-04-03 NOTE — Telephone Encounter (Signed)
Gave patient AVS and calendar of upcoming July appointments.  °

## 2017-04-03 NOTE — Telephone Encounter (Signed)
Spoke with patient to inform her of normal xray results.  Patient happy and voiced voiced understanding.  Gave thanks for call and had no further concerns at this time.

## 2017-04-03 NOTE — Pre-Procedure Instructions (Signed)
[id number]

## 2017-04-03 NOTE — Progress Notes (Signed)
CLINIC:  Survivorship   REASON FOR VISIT:  Routine follow-up post-treatment for a recent history of breast cancer.  BRIEF ONCOLOGIC HISTORY:    Malignant neoplasm of upper-outer quadrant of left breast in female, estrogen receptor negative ([place])   12/19/2015 Initial Diagnosis    Left breast biopsy: IDC with DCI S, grade 3, ER 0%, PR 0%, KI67 40%, her 2 positive ratio 6.15, copy number 16.5; palpable lump left breast 1.6 cm irregular oval mass, T1cN0 stage I a clinical stage      01/17/2016 Surgery    Left Lumpectomy: IDC 4.1 cm with DCIS, Margin 0.1 cm, 1/3 LN Positive, ER/PR Neg, Her 2 Pos with Ki 67 of 40%, T2N1 (stage 2B)      01/2016 Genetic Testing    Genetic tetsing was negative and found no deleterious mutations. Genes tested include: ATM, BARD1, BRCA1, BRCA2, BRIP1, CDH1, CHEK2, FANCC, MLH1, MSH2, MSH6, NBN, PALB2, PMS2, PTEN, RAD51C, RAD51D, TP53, and XRCC2.  This panel also includes deletion/duplication analysis (without sequencing) for one gene, EPCAM.        02/22/2016 -  Chemotherapy    Adjuvant chemotherapy with TCH Perjeta 6 cycles followed by Herceptin Perjeta maintenance       07/02/2016 - 08/13/2016 Radiation Therapy    Adjuvant radiation therapy       INTERVAL HISTORY:  Brianna James presents to the [facility] today for our initial meeting to review her survivorship care plan detailing her treatment course for breast cancer, as well as monitoring long-term side effects of that treatment, education regarding health maintenance, screening, and overall wellness and health promotion.     Overall, Brianna James reports feeling quite well.  She says about every other night her left arm and neck will feel cramping in nature.  She also notes her lower leg will get a sharp pain with movement.  This pain she describes as sharp and intermittent.  She says that it is worse with walking.  She denies any focal weakness, numbness, + blurred vision x 1 month (has eye exam on  Friday), no double vision, speech change.  She also notes a mole on her arm that gotten incredibly larger since undergoing treatment.  She feels like her left upper arm is tighter and swollen.    REVIEW OF SYSTEMS:  Review of Systems  Constitutional: Negative for appetite change, chills, fatigue, fever and unexpected weight change.  HENT:   Negative for hearing loss, lump/mass and trouble swallowing.   Eyes: Negative for eye problems and icterus.  Respiratory: Negative for chest tightness, cough and shortness of breath.   Cardiovascular: Negative for chest pain, leg swelling and palpitations.  Gastrointestinal: Negative for abdominal distention, abdominal pain, constipation, diarrhea, nausea and vomiting.  Endocrine: Negative for hot flashes.  Genitourinary: Negative for difficulty urinating.   Musculoskeletal: Negative for arthralgias.  Skin: Negative for itching and rash.  Neurological: Negative for dizziness, extremity weakness, headaches and numbness.  Hematological: Negative for adenopathy.  Psychiatric/Behavioral: Negative for depression. The patient is not nervous/anxious.   Breast: Denies any new nodularity, masses, tenderness, nipple changes, or nipple discharge.      ONCOLOGY TREATMENT TEAM:  1. Surgeon:  Dr. Barry Dienes at [facility] 2. Medical Oncologist: Dr. Lindi Adie  3. Radiation Oncologist: Dr. Isidore Moos    PAST MEDICAL/SURGICAL HISTORY:  Past Medical History:  Diagnosis Date  . Anemia    low iron  . Cancer ([place]) 01/2016   breast - left  . Chicken pox   . GERD (  gastroesophageal reflux disease)    during pregnancy  . Hematoma    left breast  . History of radiation therapy 07/02/16- 08/13/16   Left Breast 50 Gy in 25 fractions, Left Breast Boost 10 Gy in 5 fractions, Left Axilla 45 Gy in 25 fractions.   Marland Kitchen HSV infection   . PONV (postoperative nausea and vomiting)    Past Surgical History:  Procedure Laterality Date  . BREAST LUMPECTOMY WITH RADIOACTIVE  SEED AND SENTINEL LYMPH NODE BIOPSY Left 01/17/2016   Procedure: LEFT BREAST LUMPECTOMY WITH RADIOACTIVE SEED AND SENTINEL LYMPH NODE BIOPSY;  Surgeon: [name], MD;  Location: [facility];  Service: General;  Laterality: Left;  . EVACUATION BREAST HEMATOMA Left 01/25/2016   Procedure: WASHOUT LEFT BREAST HEMATOMA;  Surgeon: [name], MD;  Location: [place];  Service: General;  Laterality: Left;  . PORT-A-CATH REMOVAL Right 02/27/2017   Procedure: REMOVAL PORT-A-CATH ERAS PATHWAY;  Surgeon: [name], MD;  Location: [place];  Service: General;  Laterality: Right;  . PORTACATH PLACEMENT Right 01/17/2016   Procedure: INSERTION PORT-A-CATH WITH [place];  Surgeon: [name], MD;  Location: [facility];  Service: General;  Laterality: Right;  . WISDOM TOOTH EXTRACTION       ALLERGIES:  Allergies  Allergen Reactions  . Other Swelling    Magic Mouthwash w/ lidocaine  . Oxycodone Nausea And Vomiting    Pt reports adverse reaction.   . Peanuts [Peanut Oil] Hives     CURRENT MEDICATIONS:  Outpatient Encounter Medications as of 04/03/2017  Medication Sig Note  . clindamycin (CLINDAGEL) 1 % gel APPLY TO AFFECTED AREA TWICE A DAY   . [DISCONTINUED] doxycycline (VIBRA-TABS) 100 MG tablet Take 1 tablet (100 mg total) by mouth 2 (two) times daily. (Patient not taking: Reported on 02/21/2017)   . [DISCONTINUED] HYDROcodone-acetaminophen (NORCO/VICODIN) 5-325 MG tablet Take 1-2 tablets by mouth every 4 (four) hours as needed for moderate pain or severe pain. (Patient not taking: Reported on 04/03/2017)   . [DISCONTINUED] OVER THE COUNTER MEDICATION Take by mouth at bedtime as needed. Night Time Flu and Severe Cough and Cold Medicine   . [DISCONTINUED] Pheniramine-PE-APAP (THERAFLU FLU & SORE THROAT) 20-10-650 MG PACK Take by mouth every 4 (four) hours as needed (for cold symptoms.).  02/26/2017: Not taking this medication  . [DISCONTINUED] Prenatal  MV-Min-FA-Omega-3 (PRENATAL GUMMIES/DHA & FA PO) Take 2 tablets by mouth daily.    No facility-administered encounter medications on file as of 04/03/2017.      ONCOLOGIC FAMILY HISTORY:  Family History  Problem Relation Age of Onset  . Arthritis Mother   . Stroke Mother   . Stroke Maternal Aunt   . Stroke Maternal Uncle   . Breast cancer Maternal Grandmother        dx at 41 or younger; d. 75y  . Lung cancer Maternal Grandfather   . Stroke Paternal Grandmother   . Uterine cancer Paternal Grandmother        dx. 61-62  . Other Paternal Grandmother        colostomy bag in her 67s; unspecified reason  . Lung cancer Paternal Grandfather   . Uterine cancer Maternal Aunt        dx unspecified age  . Breast cancer Other        maternal great grandmother (MGM's mother) d. 58     GENETIC COUNSELING/TESTING: Negative  SOCIAL HISTORY:  Brianna James is married and lives with her husband and 2 year  old son in [place], [place].  Brianna James is currently working full time at home for [employer].  She denies any current or history of tobacco, alcohol, or illicit drug use.     PHYSICAL EXAMINATION:  Vital Signs:   Vitals:   04/03/17 0849  BP: 121/82  Pulse: 87  Resp: 20  Temp: 98.5 F (36.9 C)  SpO2: 98%   Filed Weights   04/03/17 0849  Weight: 235 lb 12.8 oz (107 kg)   General: Well-nourished, well-appearing female in no acute distress.  She is unaccompanied today.   HEENT: Head is normocephalic.  Pupils equal and reactive to light. Conjunctivae clear without exudate.  Sclerae anicteric. Oral mucosa is pink, moist.  Oropharynx is pink without lesions or erythema.  Lymph: No cervical, supraclavicular, or infraclavicular lymphadenopathy noted on palpation.  Cardiovascular: Regular rate and rhythm.Marland Kitchen Respiratory: Clear to auscultation bilaterally. Chest expansion symmetric; breathing non-labored.  Breasts: right breast without nodules, masses, skin or nipple  changes, left breast s/p lumpectomy and radiation, hyperpigmentation and slight thickening of breast noted, no nodules, masses, nipple changes.   GI: Abdomen soft and round; non-tender, non-distended. Bowel sounds normoactive.  GU: Deferred.  Neuro: No focal deficits. Steady gait.  Psych: Mood and affect normal and appropriate for situation.  Extremities: No edema noted or appreciated, particularly in the left upper arm  MSK: No focal spinal tenderness to palpation.  Full range of motion in bilateral upper extremities Skin: Warm and dry.  LABORATORY DATA:  None for this visit.  DIAGNOSTIC IMAGING:       ASSESSMENT AND PLAN:  Brianna James is a pleasant 37 y.o. female with Stage IIB left breast invasive ductal carcinoma, ER-/PR-/HER2+, diagnosed in 12/2015, treated with lumpectomy, adjuvant chemotherapy, maintenance herceptin/perjeta x 1 year and adjuvant radiation therapy.  She presents to the Survivorship Clinic for our initial meeting and routine follow-up post-completion of treatment for breast cancer.    1. Stage IIB left breast cancer:  Brianna James is continuing to recover from definitive treatment for breast cancer. She will follow-up with her medical oncologist, Dr. Lindi Adie in 6 months with history and physical exam per surveillance protocol.  Today, a comprehensive survivorship care plan and treatment summary was reviewed with the patient today detailing her breast cancer diagnosis, treatment course, potential late/long-term effects of treatment, appropriate follow-up care with recommendations for the future, and patient education resources.  A copy of this summary, along with a letter will be sent to the patient's primary care provider via mail/fax/In Basket message after today's visit.    2. Left arm pain and lower leg pain: I have referred her to physical therapy to work with her on her left arm and eval for lymphedema.  I have also ordered an xray of her cervical spine and her left lower  leg, to evaluate for any issues in the bones there.    3. Left wrist skin lesion: I recommended she see a dermatologist for routine skin checks and have this examined.    4. Bone health:  Given Brianna James history of breast cancer, she is at slight risk for bone demineralization.  She was given education on specific activities to promote bone health.  5. Cancer screening:  Due to Brianna James history and her age, she should receive screening for skin cancers, colon cancer, and gynecologic cancers.  The information and recommendations are listed on the patient's comprehensive care plan/treatment summary and were reviewed in detail with the patient.    6.  Health maintenance and wellness promotion: Brianna James was encouraged to consume 5-7 servings of fruits and vegetables per day. We reviewed the "Nutrition Rainbow" handout, as well as the handout "Take Control of Your Health and Reduce Your Cancer Risk" from the [place].  She was also encouraged to engage in moderate to vigorous exercise for 30 minutes per day most days of the week. We discussed the [employer] fitness program, which is designed for cancer survivors to help them become more physically fit after cancer treatments.  She was instructed to limit her alcohol consumption and continue to abstain from tobacco use.     7. Support services/counseling: It is not uncommon for this period of the patient's cancer care trajectory to be one of many emotions and stressors.  We discussed an opportunity for her to participate in the next session of [facility] ("Finding Your New Normal") support group series designed for patients after they have completed treatment.   Brianna James was encouraged to take advantage of our many other support services programs, support groups, and/or counseling in coping with her new life as a cancer survivor after completing anti-cancer treatment.  She was offered support today through active listening and expressive  supportive counseling.  She was given information regarding our available services and encouraged to contact me with any questions or for help enrolling in any of our support group/programs.    Dispo:   -Return to cancer center in 6 months for follow up with Dr. Lindi Adie -Referral to Physical Therapy  -Mammogram due in 01/2018 -Follow up with Dr. Barry Dienes at [facility] 03/2018 -She is welcome to return back to the Survivorship Clinic at any time; no additional follow-up needed at this time.  -Consider referral back to survivorship as a long-term survivor for continued surveillance  A total of (50) minutes of face-to-face time was spent with this patient with greater than 50% of that time in counseling and care-coordination.   Gardenia Phlegm, NP Survivorship Program [facility] [phone]   Note: PRIMARY CARE PROVIDER Jearld Fenton, [place] [phone]

## 2017-04-03 NOTE — Telephone Encounter (Signed)
-----   Message from Gardenia Phlegm, NP sent at 04/03/2017  3:33 PM EST ----- Please call patient with results, normal xrays.   ----- Message ----- From: Interface, Rad Results In Sent: 04/03/2017   3:19 PM To: Gardenia Phlegm, NP

## 2017-05-20 ENCOUNTER — Ambulatory Visit: Payer: 59 | Attending: Adult Health | Admitting: Physical Therapy

## 2017-05-20 DIAGNOSIS — R293 Abnormal posture: Secondary | ICD-10-CM

## 2017-05-20 DIAGNOSIS — I890 Lymphedema, not elsewhere classified: Secondary | ICD-10-CM

## 2017-05-20 DIAGNOSIS — M6281 Muscle weakness (generalized): Secondary | ICD-10-CM | POA: Diagnosis not present

## 2017-05-20 DIAGNOSIS — M25612 Stiffness of left shoulder, not elsewhere classified: Secondary | ICD-10-CM | POA: Insufficient documentation

## 2017-05-20 NOTE — Therapy (Signed)
[place], [place], [address] Phone: [phone]   Fax:  [phone]  Physical Therapy Evaluation  Patient Details  Name: Brianna James MRN: [id number] Date of Birth: Mar 04, 1981 Referring Provider: Wilber Bihari    Encounter Date: 05/20/2017  PT End of Session - 05/20/17 1618    Visit Number  1    Number of Visits  9    Date for PT Re-Evaluation  07/25/17 pt can only come one time a week     PT Start Time  1310    PT Stop Time  1345    PT Time Calculation (min)  35 min    Activity Tolerance  Patient tolerated treatment well    Behavior During Therapy  [facility] for tasks assessed/performed       Past Medical History:  Diagnosis Date  . Anemia    low iron  . Cancer ([place]) 01/2016   breast - left  . Chicken pox   . GERD (gastroesophageal reflux disease)    during pregnancy  . Hematoma    left breast  . History of radiation therapy 07/02/16- 08/13/16   Left Breast 50 Gy in 25 fractions, Left Breast Boost 10 Gy in 5 fractions, Left Axilla 45 Gy in 25 fractions.   Marland Kitchen HSV infection   . PONV (postoperative nausea and vomiting)     Past Surgical History:  Procedure Laterality Date  . BREAST LUMPECTOMY WITH RADIOACTIVE SEED AND SENTINEL LYMPH NODE BIOPSY Left 01/17/2016   Procedure: LEFT BREAST LUMPECTOMY WITH RADIOACTIVE SEED AND SENTINEL LYMPH NODE BIOPSY;  Surgeon: [name], MD;  Location: [place];  Service: General;  Laterality: Left;  . EVACUATION BREAST HEMATOMA Left 01/25/2016   Procedure: WASHOUT LEFT BREAST HEMATOMA;  Surgeon: [name], MD;  Location: [place];  Service: General;  Laterality: Left;  . PORT-A-CATH REMOVAL Right 02/27/2017   Procedure: REMOVAL PORT-A-CATH ERAS PATHWAY;  Surgeon: [name], MD;  Location: [place];  Service: General;  Laterality: Right;  . PORTACATH PLACEMENT Right 01/17/2016   Procedure: INSERTION PORT-A-CATH WITH [place];   Surgeon: [name], MD;  Location: [place];  Service: General;  Laterality: Right;  . WISDOM TOOTH EXTRACTION      There were no vitals filed for this visit.   Subjective Assessment - 05/20/17 1317    Subjective  Pt states she can't lift arm straight up as high as the other one.  She has  soreness with lifting 103 year old son, difficulty with sleep with some muscle twitching when she lays on it. , noticed a little bit of swelling in her arm   She occasionally has trouble with pain in her left leg that is very sharp and lasts for a about 5 hours     Pertinent History  Breast cancer diagnosed December 19 2015 with biopsy followed by a  left lumpectomy with sentinel node biopsy on 01/17/2016 and evacuation of left breast hematoma on 01/25/2016.  She had chemo starting on 02/22/2016 with radiation form 4/17- 5/29/ 2018     Patient Stated Goals  to get shoulder range of motion back and get into an exercise program     Currently in Pain?  No/denies         [facility] PT Assessment - 05/20/17 0001      Assessment   Medical Diagnosis  left breast cancer     Referring Provider  Wilber Bihari  Onset Date/Surgical Date  12/19/15    Hand Dominance  Right      Precautions   Precautions  None      Restrictions   Weight Bearing Restrictions  No      Balance Screen   Has the patient fallen in the past 6 months  No    Has the patient had a decrease in activity level because of a fear of falling?   No    Is the patient reluctant to leave their home because of a fear of falling?   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     Leisure  just a little bit of  elliptical       Cognition   Overall Cognitive Status  Within Functional Limits for tasks assessed      Observation/Other Assessments   Observations  Pt has well healed scar in left breast near axilla     Skin Integrity  well healed     Quick DASH   25      Sensation   Light Touch  Not tested      Coordination   Gross Motor Movements are Fluid and Coordinated  Yes      Posture/Postural Control   Posture/Postural Control  Postural limitations    Postural Limitations  Rounded Shoulders;Forward head;Increased lumbar lordosis      ROM / Strength   AROM / PROM / Strength  AROM;Strength      AROM   Right Shoulder Flexion  160 Degrees    Right Shoulder ABduction  170 Degrees    Left Shoulder Flexion  135 Degrees    Left Shoulder ABduction  140 Degrees      Strength   Overall Strength  Deficits    Overall Strength Comments  left shoulder limited in end ranges of motion.  Visible decreased scapular stabliliy on the left       Palpation   Palpation comment  tender tightness and fullness in left axilla , palpable fullness in left breast around scar         LYMPHEDEMA/ONCOLOGY QUESTIONNAIRE - 05/20/17 1334      Right Upper Extremity Lymphedema   10 cm Proximal to Olecranon Process  33.5 cm    Olecranon Process  28.5 cm    15 cm Proximal to Ulnar Styloid Process  27 cm    10 cm Proximal to Ulnar Styloid Process  23 cm    Just Proximal to Ulnar Styloid Process  17 cm    Across Hand at [employer]  20.9 cm    At [place] of 2nd Digit  6.5 cm      Left Upper Extremity Lymphedema   10 cm Proximal to Olecranon Process  34 cm    Olecranon Process  28.5 cm    15 cm Proximal to Ulnar Styloid Process  26 cm    10 cm Proximal to Ulnar Styloid Process  22.5 cm    Just Proximal to Ulnar Styloid Process  17.5 cm    Across Hand at [employer]  21.1 cm  At [facility] of 2nd Digit  6.7 cm        Quick Dash - 05/20/17 0001    Open a tight or new jar  No difficulty    Do heavy  household chores (wash walls, wash floors)  Mild difficulty    Carry a shopping bag or briefcase  No difficulty    Wash your back  Moderate difficulty    Use a knife to cut food  No difficulty    Recreational activities in which you take some force or impact through your arm, shoulder, or hand (golf, hammering, tennis)  Mild difficulty    During the past week, to what extent has your arm, shoulder or hand problem interfered with your normal social activities with family, friends, neighbors, or groups?  Slightly    During the past week, to what extent has your arm, shoulder or hand problem limited your work or other regular daily activities  Slightly    Arm, shoulder, or hand pain.  Moderate    Tingling (pins and needles) in your arm, shoulder, or hand  Mild    Difficulty Sleeping  Moderate difficulty    DASH Score  25 %       Objective measurements completed on examination: See above findings.                PT Short Term Goals - 05/20/17 1626      PT SHORT TERM GOAL #1   Title  Pt will report she has an understanding of lymphedema risk reduction practices     Time  4    Period  Weeks    Status  New      PT SHORT TERM GOAL #2   Title  Pt will be able to do self manual lymph draiange and use of compression to help with breast lymphedema     Time  4    Period  Weeks    Status  New      PT SHORT TERM GOAL #3   Title  Pt will be independent in home exercise program for shoulder ROM     Time  4    Period  Weeks    Status  New        PT Long Term Goals - 05/20/17 1628      PT LONG TERM GOAL #1   Title  Pt will have 160 degrees of left shoulder abduction so that she can perfrom household chores with ease at home     Time  8    Period  Weeks    Status  New      PT LONG TERM GOAL #2   Title  Pt will improve functional strength of left arm so that she can care for her son without difficulty     Time  8    Period  Weeks    Status  New      PT LONG TERM GOAL #3    Title  Pt will be independent in Strength ABC program for shoulder strength     Time  8    Period  Weeks    Status  New      PT LONG TERM GOAL #4   Title  Pt will decrease Quick DASH score to < 10 indicating an improvement in functional strengthe of left UE     Baseline  25    Time  8    Period  Weeks    Status  New             Plan - 05/20/17 1618    Clinical Impression Statement  37 yo female who completed treatment for breast cancer 08/13/2016 comes in with stiffness and weakness  in left shoudler especailly at end range and fullness in left breast.  She also has intermittent pain in left leg from unknown cause and wants to get back into exercise program. She wants to know how to get started.  She can only come to PT on Tuesdays as she works long hours the other days of the week  Script for [employer] bra sent to [employer]     History and Personal Factors relevant to plan of care:  comleted cancer treatment     Clinical Presentation  Stable    Clinical Decision Making  Low    Rehab Potential  Good    Clinical Impairments Affecting Rehab Potential  previous radatiation, evauation of hemotoma on left breast     PT Frequency  1x / week    PT Duration  8 weeks    PT Treatment/Interventions  ADLs/Self Care Home Management;Scar mobilization;Passive range of motion;Therapeutic exercise;Taping;Compression bandaging;Manual lymph drainage;Patient/family education;Manual techniques    PT Next Visit Plan  Teach dowel rod stretching and wall stretches for home. PROM and manual techniques to improvem end range of shoulder motion see if script is back for compression bra. start manual lymph draiange to left breast . Later teach abdominal exercises and stretches to low back. teach Strength ABC and lymphedema risk reduction     Recommended Other Services  ABC class     Consulted and Agree with Plan of Care  Patient       Patient will benefit from skilled therapeutic intervention in  order to improve the following deficits and impairments:  Decreased knowledge of use of DME, Increased fascial restricitons, Pain, Postural dysfunction, Decreased scar mobility, Decreased range of motion, Decreased strength, Impaired perceived functional ability, Impaired UE functional use, Obesity, Increased edema, Decreased knowledge of precautions  Visit Diagnosis: Stiffness of left shoulder joint - Plan: PT plan of care cert/re-cert  Lymphedema, not elsewhere classified - Plan: PT plan of care cert/re-cert  Muscle weakness (generalized) - Plan: PT plan of care cert/re-cert  Abnormal posture - Plan: PT plan of care cert/re-cert     Problem List Patient Active Problem List   Diagnosis Date Noted  . Infected blister 01/15/2017  . Family history of breast cancer in female 01/04/2016  . Family history of uterine cancer 01/04/2016  . Malignant neoplasm of upper-outer quadrant of left breast in female, estrogen receptor negative ([place]) 01/03/2016   Brianna James PT  Brianna James 05/20/2017, [place], [place], [address] Phone: [phone]   Fax:  [phone]  Name: Brianna James MRN: [id number] Date of Birth: 1980-11-15

## 2017-05-26 ENCOUNTER — Ambulatory Visit: Payer: 59 | Admitting: Internal Medicine

## 2017-05-27 ENCOUNTER — Other Ambulatory Visit: Payer: Self-pay | Admitting: Student

## 2017-05-27 ENCOUNTER — Encounter: Payer: Self-pay | Admitting: Internal Medicine

## 2017-05-27 ENCOUNTER — Ambulatory Visit (INDEPENDENT_AMBULATORY_CARE_PROVIDER_SITE_OTHER): Payer: 59 | Admitting: Internal Medicine

## 2017-05-27 VITALS — BP 122/82 | HR 92 | Temp 97.9°F | Wt 235.0 lb

## 2017-05-27 DIAGNOSIS — J029 Acute pharyngitis, unspecified: Secondary | ICD-10-CM

## 2017-05-27 DIAGNOSIS — Z9889 Other specified postprocedural states: Secondary | ICD-10-CM | POA: Diagnosis not present

## 2017-05-27 LAB — POCT RAPID STREP A (OFFICE): Rapid Strep A Screen: NEGATIVE

## 2017-05-27 MED ORDER — METHYLPREDNISOLONE ACETATE 80 MG/ML IJ SUSP: 80.0000 mg | Freq: Once | INTRAMUSCULAR | Status: DC

## 2017-05-27 NOTE — Addendum Note (Signed)
Addended by: Lurlean Nanny on: 05/27/2017 11:33 AM   Modules accepted: Orders

## 2017-05-27 NOTE — Patient Instructions (Signed)

## 2017-05-27 NOTE — Progress Notes (Signed)
Subjective:    Patient ID: Brianna James, female    DOB: 1980/04/18, 37 y.o.   MRN: [id number]  HPI  Pt presents to the clinic today with c/o sore throat. She reports this started 2 weeks ago, but worsened 1 week ago. She is having some difficulty swallowing. She denies fever but has had some chills. She denies runny nose, nasal congestion, ear pain or cough. She has taken Theraflu, Robitussin, Ibuprofen and salt water gargles with some relief. She has had sick contacts.  Review of Systems      Past Medical History:  Diagnosis Date  . Anemia    low iron  . Cancer ([place]) 01/2016   breast - left  . Chicken pox   . GERD (gastroesophageal reflux disease)    during pregnancy  . Hematoma    left breast  . History of radiation therapy 07/02/16- 08/13/16   Left Breast 50 Gy in 25 fractions, Left Breast Boost 10 Gy in 5 fractions, Left Axilla 45 Gy in 25 fractions.   Marland Kitchen HSV infection   . PONV (postoperative nausea and vomiting)     No current outpatient medications on file.   No current facility-administered medications for this visit.     Allergies  Allergen Reactions  . Other Swelling    Magic Mouthwash w/ lidocaine  . Oxycodone Nausea And Vomiting    Pt reports adverse reaction.   . Peanuts [Peanut Oil] Hives    Family History  Problem Relation Age of Onset  . Arthritis Mother   . Stroke Mother   . Stroke Maternal Aunt   . Stroke Maternal Uncle   . Breast cancer Maternal Grandmother        dx at 30 or younger; d. 3y  . Lung cancer Maternal Grandfather   . Stroke Paternal Grandmother   . Uterine cancer Paternal Grandmother        dx. 61-62  . Other Paternal Grandmother        colostomy bag in her 87s; unspecified reason  . Lung cancer Paternal Grandfather   . Uterine cancer Maternal Aunt        dx unspecified age  . Breast cancer Other        maternal great grandmother (MGM's mother) d. 83    Social History   Socioeconomic History  . Marital status:  Married    Spouse name: Not on file  . Number of children: Not on file  . Years of education: Not on file  . Highest education level: Not on file  Social Needs  . Financial resource strain: Not on file  . Food insecurity - worry: Not on file  . Food insecurity - inability: Not on file  . Transportation needs - medical: Not on file  . Transportation needs - non-medical: Not on file  Occupational History  . Not on file  Tobacco Use  . Smoking status: Never Smoker  . Smokeless tobacco: Never Used  Substance and Sexual Activity  . Alcohol use: Yes    Alcohol/week: 0.0 oz    Comment: rare - wine maybe 2x per yr  . Drug use: No  . Sexual activity: Yes    Birth control/protection: None  Other Topics Concern  . Not on file  Social History Narrative  . Not on file     Constitutional: Denies fever, malaise, fatigue, headache or abrupt weight changes.  HEENT: Pt reports sore throat. Denies eye pain, eye redness, ear pain, ringing in the ears,  wax buildup, runny nose, nasal congestion, bloody nose. Respiratory: Denies difficulty breathing, shortness of breath, cough or sputum production.    No other specific complaints in a complete review of systems (except as listed in HPI above).  Objective:   Physical Exam   BP 122/82   Pulse 92   Temp 97.9 F (36.6 C) (Oral)   Wt 235 lb (106.6 kg)   LMP 05/11/2017   SpO2 98%   BMI 36.81 kg/m  Wt Readings from Last 3 Encounters:  05/27/17 235 lb (106.6 kg)  04/03/17 235 lb 12.8 oz (107 kg)  02/27/17 233 lb (105.7 kg)    General: Appears her stated age, in NAD. HEENT:  Ears: Tm's gray and intact, normal light reflex; Throat/Mouth: Teeth present, mucosa erythematous and moist, no exudate, lesions or ulcerations noted.  Neck:  No adenopathy noted. Pulmonary/Chest: Normal effort and positive vesicular breath sounds. No respiratory distress. No wheezes, rales or ronchi noted.   BMET    Component Value Date/Time   NA 135 02/27/2017  0925   NA 139 01/02/2017 0920   K 3.9 02/27/2017 0925   K 4.4 01/02/2017 0920   CL 104 02/27/2017 0925   CO2 22 02/27/2017 0925   CO2 27 01/02/2017 0920   GLUCOSE 88 02/27/2017 0925   GLUCOSE 94 01/02/2017 0920   BUN 5 (L) 02/27/2017 0925   BUN 8.7 01/02/2017 0920   CREATININE 0.97 02/27/2017 0925   CREATININE 1.0 01/02/2017 0920   CALCIUM 8.7 (L) 02/27/2017 0925   CALCIUM 9.0 01/02/2017 0920   GFRNONAA >60 02/27/2017 0925   GFRAA >60 02/27/2017 0925    Lipid Panel  No results found for: CHOL, TRIG, HDL, CHOLHDL, VLDL, LDLCALC  CBC    Component Value Date/Time   WBC 8.0 02/27/2017 0925   RBC 4.55 02/27/2017 0925   HGB 11.3 (L) 02/27/2017 0925   HGB 11.2 (L) 01/02/2017 0920   HCT 35.4 (L) 02/27/2017 0925   HCT 36.0 01/02/2017 0920   PLT 247 02/27/2017 0925   PLT 259 01/02/2017 0920   MCV 77.8 (L) 02/27/2017 0925   MCV 77.8 (L) 01/02/2017 0920   MCH 24.8 (L) 02/27/2017 0925   MCHC 31.9 02/27/2017 0925   RDW 15.7 (H) 02/27/2017 0925   RDW 18.1 (H) 01/02/2017 0920   LYMPHSABS 2.0 01/02/2017 0920   MONOABS 0.4 01/02/2017 0920   EOSABS 0.1 01/02/2017 0920   BASOSABS 0.0 01/02/2017 0920    Hgb A1C No results found for: HGBA1C         Assessment & Plan:   Sore Throat:  RST: negative Likely viral  80 mg Depo IM today Advised her to continue salt water gargles and Ibuprofen 600 mg every 8 hours as needed Get some rest and drink plenty of fluids  Return precautions discussed Webb Silversmith, NP

## 2017-05-30 ENCOUNTER — Ambulatory Visit: Payer: 59 | Admitting: Physical Therapy

## 2017-06-03 ENCOUNTER — Ambulatory Visit
Admission: RE | Admit: 2017-06-03 | Discharge: 2017-06-03 | Disposition: A | Discharge status: Home / Self Care | Payer: 59 | Source: Ambulatory Visit | Attending: Student | Admitting: Student

## 2017-06-03 ENCOUNTER — Ambulatory Visit: Payer: 59 | Admitting: Physical Therapy

## 2017-06-03 ENCOUNTER — Encounter: Payer: Self-pay | Admitting: Physical Therapy

## 2017-06-03 DIAGNOSIS — M6281 Muscle weakness (generalized): Secondary | ICD-10-CM

## 2017-06-03 DIAGNOSIS — R928 Other abnormal and inconclusive findings on diagnostic imaging of breast: Secondary | ICD-10-CM | POA: Diagnosis not present

## 2017-06-03 DIAGNOSIS — Z9889 Other specified postprocedural states: Secondary | ICD-10-CM

## 2017-06-03 DIAGNOSIS — M25612 Stiffness of left shoulder, not elsewhere classified: Secondary | ICD-10-CM | POA: Diagnosis not present

## 2017-06-03 DIAGNOSIS — R293 Abnormal posture: Secondary | ICD-10-CM

## 2017-06-03 DIAGNOSIS — I890 Lymphedema, not elsewhere classified: Secondary | ICD-10-CM

## 2017-06-03 DIAGNOSIS — N6489 Other specified disorders of breast: Secondary | ICD-10-CM | POA: Diagnosis not present

## 2017-06-03 HISTORY — DX: Malignant neoplasm of unspecified site of unspecified female breast: C50.919

## 2017-06-03 NOTE — Patient Instructions (Signed)
First of all, check with your insurance company to see if provider is in [place]   (for wigs and compression sleeves / gloves/gauntlets )  [place], [place] [address] [phone]  Will file some insurances --- call for appointment   Second to [facility] (for mastectomy prosthetics and garments) [place], [place] [address] [phone] Will file some insurances --- call for appointment  [facility]  [address]  [place], [place] [address] [phone] Lower extremity garments  [facility] [address] [place], [place]  [address] [place] Sales rep:  K[place]:  [phone] [email or web address] Biocompression pumps   [employer]  Sales rep: Donneta Romberg:  [phone] [email or web address] [place] Pulley and Flexitouch pumps    Other Resources: National Lymphedema Network:  www.lymphnet.org www.Klosetraining.com for patient articles and purchase a self manual lymph drainage DVD [email or web address] has informative articles.        SHOULDER: Flexion - Supine (Cane)        Cancer Rehab [phone]    Hold cane in both hands. Raise arms up overhead. Do not allow back to arch. Hold _5__ seconds. Do __5-10__ times; __1-2__ times a day.   SELF ASSISTED WITH OBJECT: Shoulder Abduction / Adduction - Supine    Hold cane with both hands. Move both arms from side to side, keep elbows straight.  Hold when stretch felt for __5__ seconds. Repeat __5-10__ times; __1-2__ times a day. Once this becomes easier progress to third picture bringing affected arm towards ear by staying out to side. Same hold for _5_seconds. Repeat  _5-10_ times, _1-2_ times/day.  Shoulder Blade Stretch    Clasp fingers behind head with elbows touching in front of face. Pull elbows back while pressing shoulder blades together. Relax and hold as tolerated, can place pillow under  elbow here for comfort as needed and to allow for prolonged stretch.  Repeat __5__ times. Do __1-2__ sessions per day.     SHOULDER: External Rotation - Supine (Cane)    Hold cane with both hands. Rotate arm away from body. Keep elbow on floor and next to body. _5-10__ reps per set, hold 5 seconds, _1-2__ sets per day. Add towel to keep elbow at side.  Copyright  VHI. All rights reserved.

## 2017-06-03 NOTE — Therapy (Signed)
[place], [place], [address] Phone: [phone]   Fax:  [phone]  Physical Therapy Treatment  Patient Details  Name: Brianna James MRN: [id number] Date of Birth: June 24, 1980 Referring Provider: Wilber Bihari    Encounter Date: 06/03/2017  PT End of Session - 06/03/17 1742    Visit Number  2    Number of Visits  9    Date for PT Re-Evaluation  07/25/17 pt can only come one time a week     PT Start Time  1518    PT Stop Time  1605    PT Time Calculation (min)  47 min    Activity Tolerance  Patient tolerated treatment well    Behavior During Therapy  [facility] for tasks assessed/performed       Past Medical History:  Diagnosis Date  . Anemia    low iron  . Breast cancer ([place])   . Cancer ([place]) 01/2016   breast - left  . Chicken pox   . GERD (gastroesophageal reflux disease)    during pregnancy  . Hematoma    left breast  . History of radiation therapy 07/02/16- 08/13/16   Left Breast 50 Gy in 25 fractions, Left Breast Boost 10 Gy in 5 fractions, Left Axilla 45 Gy in 25 fractions.   Marland Kitchen HSV infection   . PONV (postoperative nausea and vomiting)     Past Surgical History:  Procedure Laterality Date  . BREAST LUMPECTOMY Left 2017  . BREAST LUMPECTOMY WITH RADIOACTIVE SEED AND SENTINEL LYMPH NODE BIOPSY Left 01/17/2016   Procedure: LEFT BREAST LUMPECTOMY WITH RADIOACTIVE SEED AND SENTINEL LYMPH NODE BIOPSY;  Surgeon: [name], MD;  Location: [place];  Service: General;  Laterality: Left;  . EVACUATION BREAST HEMATOMA Left 01/25/2016   Procedure: WASHOUT LEFT BREAST HEMATOMA;  Surgeon: [name], MD;  Location: [place];  Service: General;  Laterality: Left;  . PORT-A-CATH REMOVAL Right 02/27/2017   Procedure: REMOVAL PORT-A-CATH ERAS PATHWAY;  Surgeon: [name], MD;  Location: [place];  Service: General;  Laterality: Right;  . PORTACATH PLACEMENT Right  01/17/2016   Procedure: INSERTION PORT-A-CATH WITH [place];  Surgeon: [name], MD;  Location: [facility];  Service: General;  Laterality: Right;  . WISDOM TOOTH EXTRACTION      There were no vitals filed for this visit.  Subjective Assessment - 06/03/17 1528    Subjective  Pt reports she had a mammogram today and and ultrasound and is waiting to be scheduled for a MRI. She says she intiated getting the appointments becasue she was concerned about the swelling in her left breast.     Pertinent History  Breast cancer diagnosed December 19 2015 with biopsy followed by a  left lumpectomy with sentinel node biopsy on 01/17/2016 and evacuation of left breast hematoma on 01/25/2016.  She had chemo starting on 02/22/2016 with radiation form 4/17- 5/29/ 2018     Patient Stated Goals  to get shoulder range of motion back and get into an exercise program                       [facility] Adult PT Treatment/Exercise - 06/03/17 0001      Self-Care   Self-Care  Other Self-Care Comments    Other Self-Care Comments   Pt was issued presciption for a compression bra  and showed a sample of what to look for with compression around axilla  Shoulder Exercises: Supine   Protraction  AROM;Right;Left    Protraction Limitations  Pt has pain with protaction on the left which increases with repetitions     Horizontal ABduction  AROM;Right;Left;5 reps    External Rotation  AROM;Right;Left;5 reps    Flexion  AROM;AAROM;Strengthening;Right;Left;10 reps    Flexion Limitations  with dowel rod, pt with discomfort     ABduction  AROM;Left;10 reps    ABduction Limitations  with dowel rod, pt with discomfort     Other Supine Exercises  stretching over a ball at thoracic spine with horizontal abduction of arms     Other Supine Exercises  scapular retraction       Shoulder Exercises: Sidelying   External Rotation  AROM;Left;10 reps    External Rotation Limitations  towel roll at waist      ABduction  AROM;Left;10 reps    ABduction Limitations  to 90 degrees and small circles in each direction       Manual Therapy   Manual Therapy  Soft tissue mobilization;Myofascial release;Manual Lymphatic Drainage (MLD)    Manual therapy comments  pt with tightness in left pec major and pec minor area     Manual Lymphatic Drainage (MLD)  briefly, to left shoulder collectors and anteirior and lateral chest with  extra time to fullness at left axilla                PT Short Term Goals - 05/20/17 1626      PT SHORT TERM GOAL #1   Title  Pt will report she has an understanding of lymphedema risk reduction practices     Time  4    Period  Weeks    Status  New      PT SHORT TERM GOAL #2   Title  Pt will be able to do self manual lymph draiange and use of compression to help with breast lymphedema     Time  4    Period  Weeks    Status  New      PT SHORT TERM GOAL #3   Title  Pt will be independent in home exercise program for shoulder ROM     Time  4    Period  Weeks    Status  New        PT Long Term Goals - 05/20/17 1628      PT LONG TERM GOAL #1   Title  Pt will have 160 degrees of left shoulder abduction so that she can perfrom household chores with ease at home     Time  8    Period  Weeks    Status  New      PT LONG TERM GOAL #2   Title  Pt will improve functional strength of left arm so that she can care for her son without difficulty     Time  8    Period  Weeks    Status  New      PT LONG TERM GOAL #3   Title  Pt will be independent in Strength ABC program for shoulder strength     Time  8    Period  Weeks    Status  New      PT LONG TERM GOAL #4   Title  Pt will decrease Quick DASH score to < 10 indicating an improvement in functional strengthe of left UE     Baseline  25    Time  8  Period  Weeks    Status  New            Plan - 06/03/17 1742    Clinical Impression Statement  Pt concerned about the pain and fullness she is having in  her left upper chest and will be having an MRI.  She has pain in ROM and exercise today, especailly with active left shoulde protraction, but she said she felt looser overall at the end of treatment. She will be going to get a compression bra soon to see if that will help with her symptoms     Clinical Impairments Affecting Rehab Potential  previous radiation, evauation of hemotoma on left breast     PT Next Visit Plan  start manual lymph draiange to left breast  and continue with manual work to tightnes at left shoudler.  Continue with serratus and lower trap work. Later teach abdominal exercises and stretches to low back. teach Strength ABC and lymphedema risk reduction        Patient will benefit from skilled therapeutic intervention in order to improve the following deficits and impairments:     Visit Diagnosis: Stiffness of left shoulder joint  Lymphedema, not elsewhere classified  Muscle weakness (generalized)  Abnormal posture     Problem List Patient Active Problem List   Diagnosis Date Noted  . Malignant neoplasm of upper-outer quadrant of left breast in female, estrogen receptor negative ([place]) 01/03/2016   Donato Heinz. Owens Shark PT  Norwood Levo 06/03/2017, 5:48 PM  [place], [place], [address] Phone: [phone]   Fax:  [phone]  Name: Brianna James MRN: [id number] Date of Birth: August 23, 1980

## 2017-06-06 ENCOUNTER — Ambulatory Visit (INDEPENDENT_AMBULATORY_CARE_PROVIDER_SITE_OTHER): Payer: 59 | Admitting: Nurse Practitioner

## 2017-06-06 ENCOUNTER — Encounter: Payer: Self-pay | Admitting: Nurse Practitioner

## 2017-06-06 VITALS — BP 138/78 | HR 117 | Temp 98.5°F | Ht 67.0 in | Wt 236.8 lb

## 2017-06-06 DIAGNOSIS — J0140 Acute pansinusitis, unspecified: Secondary | ICD-10-CM | POA: Diagnosis not present

## 2017-06-06 DIAGNOSIS — J209 Acute bronchitis, unspecified: Secondary | ICD-10-CM

## 2017-06-06 MED ORDER — ALBUTEROL SULFATE HFA 108 (90 BASE) MCG/ACT IN AERS
1.0000 | INHALATION_SPRAY | Freq: Four times a day (QID) | RESPIRATORY_TRACT | 0 refills | Status: DC | PRN
Start: 2017-06-06 — End: 2017-07-03

## 2017-06-06 MED ORDER — DM-GUAIFENESIN ER 30-600 MG PO TB12: 1.0000 | ORAL_TABLET | Freq: Two times a day (BID) | ORAL | 0 refills | Status: DC | PRN

## 2017-06-06 MED ORDER — FLUTICASONE PROPIONATE 50 MCG/ACT NA SUSP: 2.0000 | Freq: Every day | NASAL | 0 refills | Status: DC

## 2017-06-06 MED ORDER — BENZONATATE 100 MG PO CAPS: 100.0000 mg | ORAL_CAPSULE | Freq: Three times a day (TID) | ORAL | 0 refills | Status: DC | PRN

## 2017-06-06 MED ORDER — AZITHROMYCIN 250 MG PO TABS: 250.0000 mg | ORAL_TABLET | Freq: Every day | ORAL | 0 refills | Status: DC

## 2017-06-06 NOTE — Patient Instructions (Signed)
Encourage adequate oral hydration.  Use over-the-counter  "cold" medicines  such as "Tylenol cold" , "Advil cold",  "Mucinex" or" Mucinex D"  for cough and congestion.  Use" Delsym" or" Robitussin" cough syrup varietis for cough.  You can use plain "Tylenol" or "Advi"l for fever, chills and achyness.  Call office if no improvement in 1-2weeks.

## 2017-06-06 NOTE — Progress Notes (Signed)
Subjective:  Patient ID: Brianna James, female    DOB: 23-Aug-1980  Age: 37 y.o. MRN: [id number]  CC: Cough (cough with yellow thick mucous nearly a month. chest hurts with cough. OTC not helping. Tested for strep last week-negative.)  Cough  This is a new problem. The current episode started 1 to 4 weeks ago. The problem has been waxing and waning. The problem occurs constantly. The cough is productive of purulent sputum. Associated symptoms include chest pain, chills, headaches, nasal congestion, postnasal drip, rhinorrhea, a sore throat and shortness of breath. Pertinent negatives include no fever, heartburn, hemoptysis, myalgias, sweats, weight loss or wheezing. The symptoms are aggravated by lying down and cold air. She has tried OTC cough suppressant for the symptoms. The treatment provided no relief.  no improvement with depomedrol IM  No outpatient medications prior to visit.   Facility-Administered Medications Prior to Visit  Medication Dose Route Frequency Provider Last Rate Last Dose  . methylPREDNISolone acetate (DEPO-MEDROL) injection 80 mg  80 mg Intramuscular Once Baity, Regina W, NP        ROS See HPI  Objective:  BP 138/78 (BP Location: Right Arm, Patient Position: Sitting, Cuff Size: Normal)   Pulse (!) 117   Temp 98.5 F (36.9 C) (Oral)   Ht 5\' 7"  (1.702 m)   Wt 236 lb 12.8 oz (107.4 kg)   LMP 05/11/2017   SpO2 95%   BMI 37.09 kg/m   BP Readings from Last 3 Encounters:  06/06/17 138/78  05/27/17 122/82  04/03/17 121/82    Wt Readings from Last 3 Encounters:  06/06/17 236 lb 12.8 oz (107.4 kg)  05/27/17 235 lb (106.6 kg)  04/03/17 235 lb 12.8 oz (107 kg)    Physical Exam  Constitutional: She is oriented to person, place, and time.  HENT:  Right Ear: Tympanic membrane, external ear and ear canal normal.  Left Ear: Tympanic membrane, external ear and ear canal normal.  Nose: Mucosal edema and rhinorrhea present. Right sinus exhibits maxillary sinus  tenderness. Right sinus exhibits no frontal sinus tenderness. Left sinus exhibits maxillary sinus tenderness. Left sinus exhibits no frontal sinus tenderness.  Mouth/Throat: Uvula is midline. No trismus in the jaw. Posterior oropharyngeal erythema present. No oropharyngeal exudate.  Eyes: No scleral icterus.  Neck: Normal range of motion. Neck supple.  Cardiovascular: Normal rate and normal heart sounds.  Pulmonary/Chest: Effort normal. She has no wheezes. She has no rales.  Musculoskeletal: She exhibits no edema.  Lymphadenopathy:    She has no cervical adenopathy.  Neurological: She is alert and oriented to person, place, and time.  Vitals reviewed.   Lab Results  Component Value Date   WBC 8.0 02/27/2017   HGB 11.3 (L) 02/27/2017   HCT 35.4 (L) 02/27/2017   PLT 247 02/27/2017   GLUCOSE 88 02/27/2017   ALT 7 01/02/2017   AST 19 01/02/2017   NA 135 02/27/2017   K 3.9 02/27/2017   CL 104 02/27/2017   CREATININE 0.97 02/27/2017   BUN 5 (L) 02/27/2017   CO2 22 02/27/2017    US Breast Ltd Uni Left Inc Axilla  Addendum Date: 06/04/2017   ADDENDUM REPORT: 06/04/2017 08:48 ADDENDUM: This is a left diagnostic mammogram and ultrasound. Electronically Signed   By: Dorise Bullion III M.D   On: 06/04/2017 08:48   Addendum Date: 06/03/2017   ADDENDUM REPORT: 06/03/2017 16:18 ADDENDUM: In addition to previous recommendations, recommend continued annual diagnostic mammography. Electronically Signed   By: Dorise Bullion  III M.D   On: 06/03/2017 16:18   Result Date: 06/04/2017 CLINICAL DATA:  Left lumpectomy 2 years ago at the age of 62. The patient's grandmother was diagnosed with breast cancer in her 16s as well. The patient has noticed a subtle indention along the scar. She has also noticed increased hardness to the breast and tenderness in this region. The patient also describes some changes in skin color. However, upon physical exam, there is no skin thickening or erythema in the region of  the scar. EXAM: DIGITAL DIAGNOSTIC BILATERAL MAMMOGRAM WITH CAD AND TOMO ULTRASOUND LEFT BREAST COMPARISON:  Previous exam(s). ACR Breast Density Category b: There are scattered areas of fibroglandular density. FINDINGS: The patient appears to be feeling the left lumpectomy site. There is fat necrosis at the lumpectomy site. There is mild increased skin thickening in the medial inferior left breast. No other suspicious mammographic findings. Mammographic images were processed with CAD. On physical exam, no skin dimpling or erythema is identified. There is some firmness without a discrete lump in the region of the patient's previous surgery. Targeted ultrasound is performed, showing a seroma and postoperative changes in the region of the patient's symptoms. IMPRESSION: The patient appears to be feeling evolving postoperative changes and fat necrosis. The patient is being seen by her physical therapist for lymphedema in the medial inferior left breast which may explain the mammographic findings. No suspicious imaging findings otherwise seen. RECOMMENDATION: Given the patient's early age of diagnosis and physical exam changes, recommend breast MRI for further assessment of the left breast. This was discussed with the patient. If the MRI is negative for malignancy recommend physical exam by the patient's surgeon of the medial inferior left breast. A punch biopsy could be performed if clinically warranted. However, I suspect the mild increased skin thickening in this region may be lymphedema given history. I have discussed the findings and recommendations with the patient. Results were also provided in writing at the conclusion of the visit. If applicable, a reminder letter will be sent to the patient regarding the next appointment. BI-RADS CATEGORY  2: Benign. Electronically Signed: By: Dorise Bullion III M.D On: 06/03/2017 10:37   Mm Diag Breast Tomo Uni Left  Addendum Date: 06/04/2017   ADDENDUM REPORT:  06/04/2017 08:48 ADDENDUM: This is a left diagnostic mammogram and ultrasound. Electronically Signed   By: Dorise Bullion III M.D   On: 06/04/2017 08:48   Addendum Date: 06/03/2017   ADDENDUM REPORT: 06/03/2017 16:18 ADDENDUM: In addition to previous recommendations, recommend continued annual diagnostic mammography. Electronically Signed   By: Dorise Bullion III M.D   On: 06/03/2017 16:18   Result Date: 06/04/2017 CLINICAL DATA:  Left lumpectomy 2 years ago at the age of 71. The patient's grandmother was diagnosed with breast cancer in her 52s as well. The patient has noticed a subtle indention along the scar. She has also noticed increased hardness to the breast and tenderness in this region. The patient also describes some changes in skin color. However, upon physical exam, there is no skin thickening or erythema in the region of the scar. EXAM: DIGITAL DIAGNOSTIC BILATERAL MAMMOGRAM WITH CAD AND TOMO ULTRASOUND LEFT BREAST COMPARISON:  Previous exam(s). ACR Breast Density Category b: There are scattered areas of fibroglandular density. FINDINGS: The patient appears to be feeling the left lumpectomy site. There is fat necrosis at the lumpectomy site. There is mild increased skin thickening in the medial inferior left breast. No other suspicious mammographic findings. Mammographic images were processed with  CAD. On physical exam, no skin dimpling or erythema is identified. There is some firmness without a discrete lump in the region of the patient's previous surgery. Targeted ultrasound is performed, showing a seroma and postoperative changes in the region of the patient's symptoms. IMPRESSION: The patient appears to be feeling evolving postoperative changes and fat necrosis. The patient is being seen by her physical therapist for lymphedema in the medial inferior left breast which may explain the mammographic findings. No suspicious imaging findings otherwise seen. RECOMMENDATION: Given the patient's early  age of diagnosis and physical exam changes, recommend breast MRI for further assessment of the left breast. This was discussed with the patient. If the MRI is negative for malignancy recommend physical exam by the patient's surgeon of the medial inferior left breast. A punch biopsy could be performed if clinically warranted. However, I suspect the mild increased skin thickening in this region may be lymphedema given history. I have discussed the findings and recommendations with the patient. Results were also provided in writing at the conclusion of the visit. If applicable, a reminder letter will be sent to the patient regarding the next appointment. BI-RADS CATEGORY  2: Benign. Electronically Signed: By: Dorise Bullion III M.D On: 06/03/2017 10:37    Assessment & Plan:   Mailen was seen today for cough.  Diagnoses and all orders for this visit:  Acute bronchitis, unspecified organism -     albuterol (PROVENTIL HFA;VENTOLIN HFA) 108 (90 Base) MCG/ACT inhaler; Inhale 1-2 puffs into the lungs every 6 (six) hours as needed. -     dextromethorphan-guaiFENesin (MUCINEX DM) 30-600 MG 12hr tablet; Take 1 tablet by mouth 2 (two) times daily as needed for cough. -     benzonatate (TESSALON) 100 MG capsule; Take 1 capsule (100 mg total) by mouth 3 (three) times daily as needed for cough. -     fluticasone (FLONASE) 50 MCG/ACT nasal spray; Place 2 sprays into both nostrils daily. -     azithromycin (ZITHROMAX Z-PAK) 250 MG tablet; Take 1 tablet (250 mg total) by mouth daily. Take 2tabs on first day, then 1tab once a day till complete  Acute non-recurrent pansinusitis -     albuterol (PROVENTIL HFA;VENTOLIN HFA) 108 (90 Base) MCG/ACT inhaler; Inhale 1-2 puffs into the lungs every 6 (six) hours as needed. -     dextromethorphan-guaiFENesin (MUCINEX DM) 30-600 MG 12hr tablet; Take 1 tablet by mouth 2 (two) times daily as needed for cough. -     benzonatate (TESSALON) 100 MG capsule; Take 1 capsule (100 mg  total) by mouth 3 (three) times daily as needed for cough. -     fluticasone (FLONASE) 50 MCG/ACT nasal spray; Place 2 sprays into both nostrils daily. -     azithromycin (ZITHROMAX Z-PAK) 250 MG tablet; Take 1 tablet (250 mg total) by mouth daily. Take 2tabs on first day, then 1tab once a day till complete   I am having Brianna James start on albuterol, dextromethorphan-guaiFENesin, benzonatate, fluticasone, and azithromycin. We will continue to administer methylPREDNISolone acetate.  Meds ordered this encounter  Medications  . albuterol (PROVENTIL HFA;VENTOLIN HFA) 108 (90 Base) MCG/ACT inhaler    Sig: Inhale 1-2 puffs into the lungs every 6 (six) hours as needed.    Dispense:  1 Inhaler    Refill:  0    Order Specific Question:   Supervising Provider    Answer:   Lucille Passy [[id number]]  . dextromethorphan-guaiFENesin (MUCINEX DM) 30-600 MG 12hr tablet  Sig: Take 1 tablet by mouth 2 (two) times daily as needed for cough.    Dispense:  14 tablet    Refill:  0    Order Specific Question:   Supervising Provider    Answer:   Lucille Passy [[id number]]  . benzonatate (TESSALON) 100 MG capsule    Sig: Take 1 capsule (100 mg total) by mouth 3 (three) times daily as needed for cough.    Dispense:  20 capsule    Refill:  0    Order Specific Question:   Supervising Provider    Answer:   Lucille Passy [[id number]]  . fluticasone (FLONASE) 50 MCG/ACT nasal spray    Sig: Place 2 sprays into both nostrils daily.    Dispense:  16 g    Refill:  0    Order Specific Question:   Supervising Provider    Answer:   Lucille Passy [[id number]]  . azithromycin (ZITHROMAX Z-PAK) 250 MG tablet    Sig: Take 1 tablet (250 mg total) by mouth daily. Take 2tabs on first day, then 1tab once a day till complete    Dispense:  6 tablet    Refill:  0    Order Specific Question:   Supervising Provider    Answer:   Lucille Passy [[id number]]    Follow-up: No follow-ups on file.  Wilfred Lacy, NP

## 2017-06-10 ENCOUNTER — Ambulatory Visit: Payer: 59 | Admitting: Physical Therapy

## 2017-06-10 ENCOUNTER — Encounter: Payer: Self-pay | Admitting: Physical Therapy

## 2017-06-10 DIAGNOSIS — R293 Abnormal posture: Secondary | ICD-10-CM

## 2017-06-10 DIAGNOSIS — M25612 Stiffness of left shoulder, not elsewhere classified: Secondary | ICD-10-CM | POA: Diagnosis not present

## 2017-06-10 DIAGNOSIS — I890 Lymphedema, not elsewhere classified: Secondary | ICD-10-CM

## 2017-06-10 DIAGNOSIS — M6281 Muscle weakness (generalized): Secondary | ICD-10-CM

## 2017-06-10 NOTE — Therapy (Signed)
[place], [place], [address] Phone: [phone]   Fax:  [phone]  Physical Therapy Treatment  Patient Details  Name: Brianna James MRN: [id number] Date of Birth: [birth year] Referring Provider: Wilber Bihari    Encounter Date: 06/10/2017  PT End of Session - 06/10/17 1546    Visit Number  3    Date for PT Re-Evaluation  07/25/17    PT Start Time  1400    PT Stop Time  1440    PT Time Calculation (min)  40 min    Activity Tolerance  Patient tolerated treatment well    Behavior During Therapy  [facility] for tasks assessed/performed       Past Medical History:  Diagnosis Date  . Anemia    low iron  . Breast cancer ([place])   . Cancer ([place]) 01/2016   breast - left  . Chicken pox   . GERD (gastroesophageal reflux disease)    during pregnancy  . Hematoma    left breast  . History of radiation therapy 07/02/16- 08/13/16   Left Breast 50 Gy in 25 fractions, Left Breast Boost 10 Gy in 5 fractions, Left Axilla 45 Gy in 25 fractions.   Marland Kitchen HSV infection   . PONV (postoperative nausea and vomiting)     Past Surgical History:  Procedure Laterality Date  . BREAST LUMPECTOMY Left 2017  . BREAST LUMPECTOMY WITH RADIOACTIVE SEED AND SENTINEL LYMPH NODE BIOPSY Left 01/17/2016   Procedure: LEFT BREAST LUMPECTOMY WITH RADIOACTIVE SEED AND SENTINEL LYMPH NODE BIOPSY;  Surgeon: [name], MD;  Location: [facility];  Service: General;  Laterality: Left;  . EVACUATION BREAST HEMATOMA Left 01/25/2016   Procedure: WASHOUT LEFT BREAST HEMATOMA;  Surgeon: [name], MD;  Location: [place];  Service: General;  Laterality: Left;  . PORT-A-CATH REMOVAL Right 02/27/2017   Procedure: REMOVAL PORT-A-CATH ERAS PATHWAY;  Surgeon: [name], MD;  Location: [facility];  Service: General;  Laterality: Right;  . PORTACATH PLACEMENT Right 01/17/2016   Procedure: INSERTION PORT-A-CATH WITH [place];   Surgeon: [name], MD;  Location: [facility];  Service: General;  Laterality: Right;  . WISDOM TOOTH EXTRACTION      There were no vitals filed for this visit.  Subjective Assessment - 06/10/17 1402    Subjective  Pt states she had brochitis last week but is feeling better   shoulder is better but she still has some soreness in her her left breast     Patient Stated Goals  to get shoulder range of motion back and get into an exercise program     Currently in Pain?  No/denies                No data recorded       [facility] Adult PT Treatment/Exercise - 06/10/17 0001      Shoulder Exercises: Supine   External Rotation  Strengthening;Right;Left;5 reps;Theraband    Theraband Level (Shoulder External Rotation)  Level 1 (Yellow)    Flexion  Strengthening;Right;Left;Theraband wide and narrow grip     Theraband Level (Shoulder Flexion)  Level 1 (Yellow)      Shoulder Exercises: Sidelying   External Rotation  AROM;Left;10 reps    Other Sidelying Exercises  small circles with hand pointed to ceiling.       Manual Therapy   Manual Therapy  Manual Lymphatic Drainage (MLD);Taping    Manual Lymphatic Drainage (MLD)  performed and insructed in  manual lymph draiange to left breast, short neck, superficial and deep abdominals. right axillary nodes and anterior interaxillary anastamosis, left inguinal nodes and left axillo-inguinal anastamosis, left breast with hand over hand instruction in supine and sidelying to get to full tender area near breast incision.  handout issued     Kinesiotex  Edema      Kinesiotix   Edema  skinkote and 4 pronged fan shape kinesiotape from groin to axilla.  Pt insturcted to remove at the first sign of skin irritation.  Handout issued for how to remove it and where to get it.              PT Education - 06/10/17 1545    Education provided  Yes    Education Details  self manual lymph drainage to left breast, shoulder strengthening  with yellow theraband, kinesiotape removal and where to get it.     Person(s) Educated  Patient    Methods  Explanation;Demonstration;Handout    Comprehension  Verbalized understanding       PT Short Term Goals - 05/20/17 1626      PT SHORT TERM GOAL #1   Title  Pt will report she has an understanding of lymphedema risk reduction practices     Time  4    Period  Weeks    Status  New      PT SHORT TERM GOAL #2   Title  Pt will be able to do self manual lymph draiange and use of compression to help with breast lymphedema     Time  4    Period  Weeks    Status  New      PT SHORT TERM GOAL #3   Title  Pt will be independent in home exercise program for shoulder ROM     Time  4    Period  Weeks    Status  New        PT Long Term Goals - 05/20/17 1628      PT LONG TERM GOAL #1   Title  Pt will have 160 degrees of left shoulder abduction so that she can perfrom household chores with ease at home     Time  8    Period  Weeks    Status  New      PT LONG TERM GOAL #2   Title  Pt will improve functional strength of left arm so that she can care for her son without difficulty     Time  8    Period  Weeks    Status  New      PT LONG TERM GOAL #3   Title  Pt will be independent in Strength ABC program for shoulder strength     Time  8    Period  Weeks    Status  New      PT LONG TERM GOAL #4   Title  Pt will decrease Quick DASH score to < 10 indicating an improvement in functional strengthe of left UE     Baseline  25    Time  8    Period  Weeks    Status  New            Plan - 06/10/17 1546    Clinical Impression Statement  pt reports she is doing better but she still winces when she raises left arm,  She moves it slowly but is able to achieve nearly full range with exercise.  She [place] still working on getting a compression bra. Overall she is progressng , but slowly     Clinical Impairments Affecting Rehab Potential  previous radiation, evauation of hemotoma on  left breast     PT Frequency  1x / week    PT Duration  8 weeks    PT Treatment/Interventions  Visual/perceptual remediation/compensation    PT Next Visit Plan  review  manual lymph draiange to left breast  and continue with manual work to tightnes at left shoudler.  review supine scap and see if she can do all the exercises in the program today Continue with serratus and lower trap work. Later teach abdominal exercises and stretches to low back. teach Strength ABC and lymphedema risk reduction        Patient will benefit from skilled therapeutic intervention in order to improve the following deficits and impairments:  Decreased knowledge of use of DME, Increased fascial restricitons, Pain, Postural dysfunction, Decreased scar mobility, Decreased range of motion, Decreased strength, Impaired perceived functional ability, Impaired UE functional use, Obesity, Increased edema, Decreased knowledge of precautions  Visit Diagnosis: Stiffness of left shoulder joint  Lymphedema, not elsewhere classified  Muscle weakness (generalized)  Abnormal posture     Problem List Patient Active Problem List   Diagnosis Date Noted  . Encounter for fertility preservation counseling 01/26/2016  . Malignant neoplasm of upper-outer quadrant of left breast in female, estrogen receptor negative ([place]) 01/03/2016   Donato Heinz. Owens Shark PT  [place]wood Levo 06/10/2017, 3:49 PM  [place], [place], [address] Phone: [phone]   Fax:  [phone]  Name: JACQLYN MAROLF MRN: [id number] Date of Birth: September 04, 1980

## 2017-06-10 NOTE — Patient Instructions (Addendum)
Manual Lymph Drainage for Left Breast.  Do daily.  Do slowly. Use flat hands with just enough pressure to stretch the skin. Do not slide over the skin, but move the skin with the hand you're using. Lie down or sit comfortably (in a recliner, for example) to do this.  1) Hug yourself:  cross arms and do circles at collar bones near neck 5-7 times (to "wake up" lots of lymph nodes in this area). 2) Take slow deep breaths, allowing your belly to balloon out as your breathe in, 5x (to "wake up" abdominal lymph nodes to take on extra fluid). 3) Right armpit-stretch skin in small circles to stimulate intact lymph nodes there, 5-7x. 4) Left groin area, at panty line-stretch skin in small circles to stimulate lymph nodes 5-7x. 5) Redirect fluid from left chest toward right armpit (stretch skin starting at left chest in 3-4 spots working toward right armpit) 3-4x across the chest. 6) Redirect fluid from left armpit toward left groin (cup your hand around the curve of your left side and do 3-4 "pumps" from armpit to groin) 3-4x down your side. 7) Draw an imaginary diagonal line from upper outer breast through the nipple area toward lower inner breast.  Direct fluid upward and inward from this line toward the pathway across your upper chest (established in #5).  Do this in three rows to treat all of the upper inner breast tissue, and do each row 3-4x. 8) Then repeat #5 above. 9) Direct fluid to treat all of lower outer breast tissue downward and outward toward pathway established in #6 that is aimed at the left groin. 10)  Then repeat #6 above. 11)  End with repeating #3 and #4 above.   [facility] [address], [place]   [address] [phone]  Over Head Pull: Narrow Grip     [place] [phone]   On back, knees bent, feet flat, band across thighs, elbows straight but relaxed. Pull hands apart (start). Keeping elbows straight, bring arms up and over head, hands toward  floor. Keep pull steady on band. Hold momentarily. Return slowly, keeping pull steady, back to start. Repeat ___ times. Band color ______   Side Pull: Double Arm   On back, knees bent, feet flat. Arms perpendicular to body, shoulder level, elbows straight but relaxed. Pull arms out to sides, elbows straight. Resistance band comes across collarbones, hands toward floor. Hold momentarily. Slowly return to starting position. Repeat ___ times. Band color _____   Sash   On back, knees bent, feet flat, left hand on left hip, right hand above left. Pull right arm DIAGONALLY (hip to shoulder) across chest. Bring right arm along head toward floor. Hold momentarily. Slowly return to starting position. Repeat ___ times. Do with left arm. Band color ______   Shoulder Rotation: Double Arm   On back, knees bent, feet flat, elbows tucked at sides, bent 90, hands palms up. Pull hands apart and down toward floor, keeping elbows near sides. Hold momentarily. Slowly return to starting position. Repeat ___ times. Band color ______    First of all, check with your insurance company to see if provider is in [place]   (for wigs and compression sleeves / gloves/gauntlets )  [place], [place] [address] [phone]  Will file some insurances --- call for appointment   Second to [facility] (for mastectomy prosthetics and garments) [place], [place] [address] [phone] Will file some  insurances --- call for appointment  [facility]  [address]  [place], [place] [address] [phone] Lower extremity garments  [facility] [address] [place], [place]  [address] [phone]  [place] Sales rep:  [place]:  [phone] [email or web address] Biocompression pumps   [employer]  Sales rep: Donneta Romberg:  [phone] [email or web address] Lyndal Pulley and Flexitouch pumps    Other  Resources: National Lymphedema Network:  www.lymphnet.org www.Klosetraining.com for patient articles and purchase a self manual lymph drainage DVD www.lymphedemablog.com has informative articles.    Kinesiotape should stay on your body for a few days.  It's OK to shower with it on.  Just pat it dry afterwards.    While the tape is on, keep an eye on the skin around it.  If you feel any itching, see any redness or feel in any way that the tape is irritating your skin, it is time to gently remove it.   To loosen the tape from your skin, use something oily like olive oil or baby oil on a cotton ball.  Gently roll the edge of the tape away from the skin.  Then, hold the edge of the tape away from the skin and gently press the skin away from the tape.  Do not pull the tape off the skin or you may cause skin irritation.  Then, inspect your skin.  Gently cleanse with soap and water

## 2017-06-17 ENCOUNTER — Ambulatory Visit: Payer: 59 | Attending: Adult Health

## 2017-06-17 DIAGNOSIS — I890 Lymphedema, not elsewhere classified: Secondary | ICD-10-CM | POA: Insufficient documentation

## 2017-06-17 DIAGNOSIS — M25612 Stiffness of left shoulder, not elsewhere classified: Secondary | ICD-10-CM | POA: Diagnosis not present

## 2017-06-17 DIAGNOSIS — R293 Abnormal posture: Secondary | ICD-10-CM | POA: Diagnosis present

## 2017-06-17 DIAGNOSIS — C50412 Malignant neoplasm of upper-outer quadrant of left female breast: Secondary | ICD-10-CM | POA: Diagnosis not present

## 2017-06-17 DIAGNOSIS — M6281 Muscle weakness (generalized): Secondary | ICD-10-CM | POA: Insufficient documentation

## 2017-06-17 NOTE — Therapy (Signed)
[place], [place], [address] Phone: [phone]   Fax:  [phone]  Physical Therapy Treatment  Patient Details  Name: Brianna James MRN: [id number] Date of Birth: 1981/03/14 Referring Provider: Wilber Bihari    Encounter Date: 06/17/2017  PT End of Session - 06/17/17 0935    Visit Number  4    Number of Visits  9    Date for PT Re-Evaluation  07/25/17    PT Start Time  0905 Pt arrived late due to traffic    PT Stop Time  0935    PT Time Calculation (min)  30 min    Activity Tolerance  Patient tolerated treatment well    Behavior During Therapy  [facility] for tasks assessed/performed       Past Medical History:  Diagnosis Date  . Anemia    low iron  . Breast cancer ([place])   . Cancer ([place]) 01/2016   breast - left  . Chicken pox   . GERD (gastroesophageal reflux disease)    during pregnancy  . Hematoma    left breast  . History of radiation therapy 07/02/16- 08/13/16   Left Breast 50 Gy in 25 fractions, Left Breast Boost 10 Gy in 5 fractions, Left Axilla 45 Gy in 25 fractions.   Marland Kitchen HSV infection   . PONV (postoperative nausea and vomiting)     Past Surgical History:  Procedure Laterality Date  . BREAST LUMPECTOMY Left 2017  . BREAST LUMPECTOMY WITH RADIOACTIVE SEED AND SENTINEL LYMPH NODE BIOPSY Left 01/17/2016   Procedure: LEFT BREAST LUMPECTOMY WITH RADIOACTIVE SEED AND SENTINEL LYMPH NODE BIOPSY;  Surgeon: [name], MD;  Location: [place];  Service: General;  Laterality: Left;  . EVACUATION BREAST HEMATOMA Left 01/25/2016   Procedure: WASHOUT LEFT BREAST HEMATOMA;  Surgeon: [name], MD;  Location: [place];  Service: General;  Laterality: Left;  . PORT-A-CATH REMOVAL Right 02/27/2017   Procedure: REMOVAL PORT-A-CATH ERAS PATHWAY;  Surgeon: [name], MD;  Location: [facility];  Service: General;  Laterality: Right;  . PORTACATH PLACEMENT Right  01/17/2016   Procedure: INSERTION PORT-A-CATH WITH [place];  Surgeon: [name], MD;  Location: [facility];  Service: General;  Laterality: Right;  . WISDOM TOOTH EXTRACTION      There were no vitals filed for this visit.  Subjective Assessment - 06/17/17 0912    Subjective  I was a little sore after last visit but just from the stretching. Went skating with my son over weekend so I'm sore from that still. Also been doing the self MLD about 2x/day and my swelling is much better.     Pertinent History  Breast cancer diagnosed December 19 2015 with biopsy followed by a  left lumpectomy with sentinel node biopsy on 01/17/2016 and evacuation of left breast hematoma on 01/25/2016.  She had chemo starting on 02/22/2016 with radiation form 4/17- 5/29/ 2018     Patient Stated Goals  to get shoulder range of motion back and get into an exercise program     Currently in Pain?  No/denies                       [facility] Adult PT Treatment/Exercise - 06/17/17 0001      Shoulder Exercises: Pulleys   Flexion  2 minutes    Flexion Limitations  VCs and demonstration for correct technique    ABduction  2 minutes  ABduction Limitations  Demonstration for technique      Shoulder Exercises: Therapy Ball   Flexion  10 reps With forward lean into end of stretch    ABduction  Left;5 reps With same side lean into end of stretch      Manual Therapy   Manual Therapy  Passive ROM    Passive ROM  In Supine: To Lt shoulder in to flexion, abduction and er to pts tolerance               PT Short Term Goals - 05/20/17 1626      PT SHORT TERM GOAL #1   Title  Pt will report she has an understanding of lymphedema risk reduction practices     Time  4    Period  Weeks    Status  New      PT SHORT TERM GOAL #2   Title  Pt will be able to do self manual lymph draiange and use of compression to help with breast lymphedema     Time  4    Period  Weeks    Status  New      PT SHORT  TERM GOAL #3   Title  Pt will be independent in home exercise program for shoulder ROM     Time  4    Period  Weeks    Status  New        PT Long Term Goals - 05/20/17 1628      PT LONG TERM GOAL #1   Title  Pt will have 160 degrees of left shoulder abduction so that she can perfrom household chores with ease at home     Time  8    Period  Weeks    Status  New      PT LONG TERM GOAL #2   Title  Pt will improve functional strength of left arm so that she can care for her son without difficulty     Time  8    Period  Weeks    Status  New      PT LONG TERM GOAL #3   Title  Pt will be independent in Strength ABC program for shoulder strength     Time  8    Period  Weeks    Status  New      PT LONG TERM GOAL #4   Title  Pt will decrease Quick DASH score to < 10 indicating an improvement in functional strengthe of left UE     Baseline  25    Time  8    Period  Weeks    Status  New            Plan - 06/17/17 0935    Clinical Impression Statement  Pt arrived late due to traffic. Began AA/ROM stretching in the gym which she tolerated well, though had some soreness throughout. Then began P/ROM today as well and she tolerated this great and reported her soreness feeling 100% better after session.     Rehab Potential  Good    Clinical Impairments Affecting Rehab Potential  previous radiation, evauation of hemotoma on left breast     PT Frequency  1x / week    PT Duration  8 weeks    PT Treatment/Interventions  ADLs/Self Care Home Management;Scar mobilization;Passive range of motion;Therapeutic exercise;Manual lymph drainage;Compression bandaging;Taping;Patient/family education;Manual techniques    PT Next Visit Plan  review  manual lymph draiange to left  breast prn and continue with manual work to [employer] at left shoudler. Continue with serratus and lower trap work. Later teach abdominal exercises and stretches to low back. teach Strength ABC and lymphedema risk reduction      PT Home Exercise Plan  supine scapular series    Consulted and Agree with Plan of Care  Patient       Patient will benefit from skilled therapeutic intervention in order to improve the following deficits and impairments:  Decreased knowledge of use of DME, Increased fascial restricitons, Pain, Postural dysfunction, Decreased scar mobility, Decreased range of motion, Decreased strength, Impaired perceived functional ability, Impaired UE functional use, Obesity, Increased edema, Decreased knowledge of precautions  Visit Diagnosis: Stiffness of left shoulder joint  Lymphedema, not elsewhere classified  Muscle weakness (generalized)  Abnormal posture     Problem List Patient Active Problem List   Diagnosis Date Noted  . Encounter for fertility preservation counseling 01/26/2016  . Malignant neoplasm of upper-outer quadrant of left breast in female, estrogen receptor negative ([place]) 01/03/2016    Otelia Limes, PTA 06/17/2017, 10:22 AM  [place], [place], [address] Phone: [phone]   Fax:  [phone]  Name: Brianna James MRN: [id number] Date of Birth: 1980/11/10

## 2017-06-24 ENCOUNTER — Encounter: Payer: Self-pay | Admitting: Physical Therapy

## 2017-06-24 ENCOUNTER — Ambulatory Visit (INDEPENDENT_AMBULATORY_CARE_PROVIDER_SITE_OTHER): Payer: 59 | Admitting: Internal Medicine

## 2017-06-24 ENCOUNTER — Ambulatory Visit: Payer: 59 | Admitting: Physical Therapy

## 2017-06-24 ENCOUNTER — Encounter: Payer: Self-pay | Admitting: Internal Medicine

## 2017-06-24 VITALS — BP 124/82 | HR 89 | Temp 98.1°F | Wt 233.0 lb

## 2017-06-24 DIAGNOSIS — B373 Candidiasis of vulva and vagina: Secondary | ICD-10-CM

## 2017-06-24 DIAGNOSIS — N898 Other specified noninflammatory disorders of vagina: Secondary | ICD-10-CM | POA: Diagnosis not present

## 2017-06-24 DIAGNOSIS — M6281 Muscle weakness (generalized): Secondary | ICD-10-CM

## 2017-06-24 DIAGNOSIS — M25612 Stiffness of left shoulder, not elsewhere classified: Secondary | ICD-10-CM | POA: Diagnosis not present

## 2017-06-24 DIAGNOSIS — R293 Abnormal posture: Secondary | ICD-10-CM

## 2017-06-24 DIAGNOSIS — B3731 Acute candidiasis of vulva and vagina: Secondary | ICD-10-CM

## 2017-06-24 DIAGNOSIS — I890 Lymphedema, not elsewhere classified: Secondary | ICD-10-CM

## 2017-06-24 MED ORDER — FLUCONAZOLE 150 MG PO TABS: 150.0000 mg | ORAL_TABLET | Freq: Once | ORAL | 0 refills | Status: AC

## 2017-06-24 NOTE — Therapy (Signed)
[place], [place], [address] Phone: [phone]   Fax:  [phone]  Physical Therapy Treatment  Patient Details  Name: Brianna James MRN: [id number] Date of Birth: Feb 25, 1981 Referring Provider: Wilber Bihari    Encounter Date: 06/24/2017  PT End of Session - 06/24/17 1824    Visit Number  5    Number of Visits  9    Date for PT Re-Evaluation  07/25/17    PT Start Time  7062    PT Stop Time  1345    PT Time Calculation (min)  40 min    Activity Tolerance  Patient tolerated treatment well       Past Medical History:  Diagnosis Date  . Anemia    low iron  . Breast cancer ([place])   . Cancer ([place]) 01/2016   breast - left  . Chicken pox   . GERD (gastroesophageal reflux disease)    during pregnancy  . Hematoma    left breast  . History of radiation therapy 07/02/16- 08/13/16   Left Breast 50 Gy in 25 fractions, Left Breast Boost 10 Gy in 5 fractions, Left Axilla 45 Gy in 25 fractions.   Marland Kitchen HSV infection   . PONV (postoperative nausea and vomiting)     Past Surgical History:  Procedure Laterality Date  . BREAST LUMPECTOMY Left 2017  . BREAST LUMPECTOMY WITH RADIOACTIVE SEED AND SENTINEL LYMPH NODE BIOPSY Left 01/17/2016   Procedure: LEFT BREAST LUMPECTOMY WITH RADIOACTIVE SEED AND SENTINEL LYMPH NODE BIOPSY;  Surgeon: [name], MD;  Location: [facility];  Service: General;  Laterality: Left;  . EVACUATION BREAST HEMATOMA Left 01/25/2016   Procedure: WASHOUT LEFT BREAST HEMATOMA;  Surgeon: [name], MD;  Location: [facility];  Service: General;  Laterality: Left;  . PORT-A-CATH REMOVAL Right 02/27/2017   Procedure: REMOVAL PORT-A-CATH ERAS PATHWAY;  Surgeon: [name], MD;  Location: [facility];  Service: General;  Laterality: Right;  . PORTACATH PLACEMENT Right 01/17/2016   Procedure: INSERTION PORT-A-CATH WITH [place];  Surgeon: [name], MD;  Location:  [facility];  Service: General;  Laterality: Right;  . WISDOM TOOTH EXTRACTION      There were no vitals filed for this visit.  Subjective Assessment - 06/24/17 1821    Subjective  Pt says she is doing much better!  She felt great after the stretch with Ranae Plumber PTA last session and her pain has not returned.  She is ready to get back to exercise     Pertinent History  Breast cancer diagnosed December 19 2015 with biopsy followed by a  left lumpectomy with sentinel node biopsy on 01/17/2016 and evacuation of left breast hematoma on 01/25/2016.  She had chemo starting on 02/22/2016 with radiation form 4/17- 5/29/ 2018     Patient Stated Goals  to get shoulder range of motion back and get into an exercise program     Currently in Pain?  No/denies                       [facility] Adult PT Treatment/Exercise - 06/24/17 0001      Exercises   Exercises  Other Exercises    Other Exercises   began instruction in Strength ABC packet and went throughs stretches and core exercises       Lumbar Exercises: Supine   Pelvic Tilt  5 reps    Clam  10 reps  Bent Knee Raise  10 reps    Bridge  10 reps             PT Education - 06/24/17 1824    Education provided  Yes    Education Details  Strength ABC program and beginning abdominal series     Person(s) Educated  Patient    Methods  Explanation;Demonstration;Handout    Comprehension  Need further instruction       PT Short Term Goals - 05/20/17 1626      PT SHORT TERM GOAL #1   Title  Pt will report she has an understanding of lymphedema risk reduction practices     Time  4    Period  Weeks    Status  New      PT SHORT TERM GOAL #2   Title  Pt will be able to do self manual lymph draiange and use of compression to help with breast lymphedema     Time  4    Period  Weeks    Status  New      PT SHORT TERM GOAL #3   Title  Pt will be independent in home exercise program for shoulder ROM     Time  4     Period  Weeks    Status  New        PT Long Term Goals - 05/20/17 1628      PT LONG TERM GOAL #1   Title  Pt will have 160 degrees of left shoulder abduction so that she can perfrom household chores with ease at home     Time  8    Period  Weeks    Status  New      PT LONG TERM GOAL #2   Title  Pt will improve functional strength of left arm so that she can care for her son without difficulty     Time  8    Period  Weeks    Status  New      PT LONG TERM GOAL #3   Title  Pt will be independent in Strength ABC program for shoulder strength     Time  8    Period  Weeks    Status  New      PT LONG TERM GOAL #4   Title  Pt will decrease Quick DASH score to < 10 indicating an improvement in functional strengthe of left UE     Baseline  25    Time  8    Period  Weeks    Status  New            Plan - 06/24/17 1825    Clinical Impression Statement  Pt is feeling much better and is ready to progress exercises.  Begain Strength ABC program and she has some pain with chest stretches on the left.  Also spent time on abdominal and beginning core exercises to help with low back pain     Clinical Impairments Affecting Rehab Potential  previous radiation, evauation of hemotoma on left breast     PT Frequency  1x / week    PT Duration  8 weeks    PT Treatment/Interventions  ADLs/Self Care Home Management;Scar mobilization;Passive range of motion;Therapeutic exercise;Manual lymph drainage;Compression bandaging;Taping;Patient/family education;Manual techniques    PT Next Visit Plan  Assess goals Continue with teaching strength ABC program as pt may not need much since she is an exerciser, continue with core strenth and PROM  to shouleras needed.     PT Home Exercise Plan  supine scapular series    Consulted and Agree with Plan of Care  Patient       Patient will benefit from skilled therapeutic intervention in order to improve the following deficits and impairments:  Decreased  knowledge of use of DME, Increased fascial restricitons, Pain, Postural dysfunction, Decreased scar mobility, Decreased range of motion, Decreased strength, Impaired perceived functional ability, Impaired UE functional use, Obesity, Increased edema, Decreased knowledge of precautions  Visit Diagnosis: Stiffness of left shoulder joint  Lymphedema, not elsewhere classified  Muscle weakness (generalized)  Abnormal posture     Problem List Patient Active Problem List   Diagnosis Date Noted  . Malignant neoplasm of upper-outer quadrant of left breast in female, estrogen receptor negative ([place]) 01/03/2016   Donato Heinz. Owens Shark PT  Norwood Levo 06/24/2017, 6:28 PM  [place], [place], [address] Phone: [phone]   Fax:  [phone]  Name: Brianna James MRN: [id number] Date of Birth: 1980/03/23

## 2017-06-24 NOTE — Patient Instructions (Addendum)
[email or web address] Courses Online Strength After Breast Cancer Look at the right of the page for Lymphedema Education Session    PELVIC TILT  Lie on back, legs bent. Exhale, tilting top of pelvis back, pubic bone up, to flatten lower back. Inhale, rolling pelvis opposite way, top forward, pubic bone down, arch in back. Repeat __10__ times. Do __2__ sessions per day. Copyright  VHI. All rights reserved.     Lie with hips and knees bent. Slowly inhale, and then exhale. Pull navel toward spine and tighten pelvic floor. Hold for __10_ seconds. Continue to breathe in and out during hold. Rest for _10__ seconds. Repeat __10_ times. Do __2-3_ times a day.   Copyright  VHI. All rights reserved.  Knee Fold   Lie on back, legs bent, arms by sides. Exhale, lifting knee to chest. Inhale, returning. Keep abdominals flat, navel to spine. Repeat __10__ times, alternating legs. Do __2__ sessions per day.  Copyright  VHI. All rights reserved.  Knee Drop   Keep pelvis stable. Without rotating hips, slowly drop knee to side, pause, return to center, bring knee across midline toward opposite hip. Feel obliques engaging. Repeat for ___10_ times each leg.  Copyright  VHI. All rights reserved.  Isometric Hold With Pelvic Floor (Hook-Lying)    Copyright  VHI. All rights reserved.  Heel Slide to Straight   Slide one leg down to straight. Return. Be sure pelvis does not rock forward, tilt, rotate, or tip to side. Do _10__ times. Restabilize pelvis. Repeat with other leg. Do __1-2_ sets, __2_ times per day.  http://ss.exer.us/16   Copyright  VHI. All rights reserved.

## 2017-06-24 NOTE — Progress Notes (Signed)
Subjective:    Patient ID: Brianna James, female    DOB: Sep 24, 1980, 37 y.o.   MRN: [id number]  HPI  Pt presents to the clinic today with c/o vaginal discharge and itching. This started 2 days ago. She reports the discharge is thick and white. She denies odor. She denies pelvic pain, abnormal bleeding, or urinary symptoms. She has not tried anything OTC for her symptoms.    Review of Systems      Past Medical History:  Diagnosis Date  . Anemia    low iron  . Breast cancer ([place])   . Cancer ([place]) 01/2016   breast - left  . Chicken pox   . GERD (gastroesophageal reflux disease)    during pregnancy  . Hematoma    left breast  . History of radiation therapy 07/02/16- 08/13/16   Left Breast 50 Gy in 25 fractions, Left Breast Boost 10 Gy in 5 fractions, Left Axilla 45 Gy in 25 fractions.   Marland Kitchen HSV infection   . PONV (postoperative nausea and vomiting)     Current Outpatient Medications  Medication Sig Dispense Refill  . albuterol (PROVENTIL HFA;VENTOLIN HFA) 108 (90 Base) MCG/ACT inhaler Inhale 1-2 puffs into the lungs every 6 (six) hours as needed. 1 Inhaler 0  . azithromycin (ZITHROMAX Z-PAK) 250 MG tablet Take 1 tablet (250 mg total) by mouth daily. Take 2tabs on first day, then 1tab once a day till complete 6 tablet 0  . benzonatate (TESSALON) 100 MG capsule Take 1 capsule (100 mg total) by mouth 3 (three) times daily as needed for cough. 20 capsule 0  . dextromethorphan-guaiFENesin (MUCINEX DM) 30-600 MG 12hr tablet Take 1 tablet by mouth 2 (two) times daily as needed for cough. 14 tablet 0  . fluticasone (FLONASE) 50 MCG/ACT nasal spray Place 2 sprays into both nostrils daily. 16 g 0   Current Facility-Administered Medications  Medication Dose Route Frequency Provider Last Rate Last Dose  . methylPREDNISolone acetate (DEPO-MEDROL) injection 80 mg  80 mg Intramuscular Once Jearld Fenton, NP        Allergies  Allergen Reactions  . Other Swelling    Magic Mouthwash w/  lidocaine  . Oxycodone Nausea And Vomiting    Pt reports adverse reaction.   . Peanuts [Peanut Oil] Hives    Family History  Problem Relation Age of Onset  . Arthritis Mother   . Stroke Mother   . Stroke Maternal Aunt   . Stroke Maternal Uncle   . Breast cancer Maternal Grandmother        dx at 42 or younger; d. 44y  . Lung cancer Maternal Grandfather   . Stroke Paternal Grandmother   . Uterine cancer Paternal Grandmother        dx. 61-62  . Other Paternal Grandmother        colostomy bag in her 96s; unspecified reason  . Lung cancer Paternal Grandfather   . Uterine cancer Maternal Aunt        dx unspecified age  . Breast cancer Other        maternal great grandmother (MGM's mother) d. 9    Social History   Socioeconomic History  . Marital status: Married    Spouse name: Not on file  . Number of children: Not on file  . Years of education: Not on file  . Highest education level: Not on file  Occupational History  . Not on file  Social Needs  . Financial resource strain: Not  on file  . Food insecurity:    Worry: Not on file    Inability: Not on file  . Transportation needs:    Medical: Not on file    Non-medical: Not on file  Tobacco Use  . Smoking status: Never Smoker  . Smokeless tobacco: Never Used  Substance and Sexual Activity  . Alcohol use: Yes    Alcohol/week: 0.0 oz    Comment: rare - wine maybe 2x per yr  . Drug use: No  . Sexual activity: Yes    Birth control/protection: None  Lifestyle  . Physical activity:    Days per week: Not on file    Minutes per session: Not on file  . Stress: Not on file  Relationships  . Social connections:    Talks on phone: Not on file    Gets together: Not on file    Attends religious service: Not on file    Active member of club or organization: Not on file    Attends meetings of clubs or organizations: Not on file    Relationship status: Not on file  . Intimate partner violence:    Fear of current or ex  partner: Not on file    Emotionally abused: Not on file    Physically abused: Not on file    Forced sexual activity: Not on file  Other Topics Concern  . Not on file  Social History Narrative  . Not on file     Constitutional: Denies fever, malaise, fatigue, headache or abrupt weight changes.  Gastrointestinal: Denies abdominal pain, bloating, constipation, diarrhea or blood in the stool.  GU: Pt reports vaginal discharge and itching. Denies urgency, frequency, pain with urination, burning sensation, blood in urine, odor.   No other specific complaints in a complete review of systems (except as listed in HPI above).   Objective:   Physical Exam   BP 124/82   Pulse 89   Temp 98.1 F (36.7 C) (Oral)   Wt 233 lb (105.7 kg)   LMP 06/10/2017   SpO2 98%   BMI 36.49 kg/m  Wt Readings from Last 3 Encounters:  06/24/17 233 lb (105.7 kg)  06/06/17 236 lb 12.8 oz (107.4 kg)  05/27/17 235 lb (106.6 kg)    General: Appears her stated age, obese in NAD. Abdomen: Soft and nontender. Normal bowel sounds.  Pelvic: Self swabbed.   BMET    Component Value Date/Time   NA 135 02/27/2017 0925   NA 139 01/02/2017 0920   K 3.9 02/27/2017 0925   K 4.4 01/02/2017 0920   CL 104 02/27/2017 0925   CO2 22 02/27/2017 0925   CO2 27 01/02/2017 0920   GLUCOSE 88 02/27/2017 0925   GLUCOSE 94 01/02/2017 0920   BUN 5 (L) 02/27/2017 0925   BUN 8.7 01/02/2017 0920   CREATININE 0.97 02/27/2017 0925   CREATININE 1.0 01/02/2017 0920   CALCIUM 8.7 (L) 02/27/2017 0925   CALCIUM 9.0 01/02/2017 0920   GFRNONAA >60 02/27/2017 0925   GFRAA >60 02/27/2017 0925    Lipid Panel  No results found for: CHOL, TRIG, HDL, CHOLHDL, VLDL, LDLCALC  CBC    Component Value Date/Time   WBC 8.0 02/27/2017 0925   RBC 4.55 02/27/2017 0925   HGB 11.3 (L) 02/27/2017 0925   HGB 11.2 (L) 01/02/2017 0920   HCT 35.4 (L) 02/27/2017 0925   HCT 36.0 01/02/2017 0920   PLT 247 02/27/2017 0925   PLT 259 01/02/2017  0920   MCV  77.8 (L) 02/27/2017 0925   MCV 77.8 (L) 01/02/2017 0920   MCH 24.8 (L) 02/27/2017 0925   MCHC 31.9 02/27/2017 0925   RDW 15.7 (H) 02/27/2017 0925   RDW 18.1 (H) 01/02/2017 0920   LYMPHSABS 2.0 01/02/2017 0920   MONOABS 0.4 01/02/2017 0920   EOSABS 0.1 01/02/2017 0920   BASOSABS 0.0 01/02/2017 0920    Hgb A1C No results found for: HGBA1C         Assessment & Plan:   Vaginal Discharge and Itching secondary to Candida:  Wet prep: + yeast eRx for Diflucan 150 mg PO x 1  Return precautions discussed Webb Silversmith, NP

## 2017-06-24 NOTE — Patient Instructions (Signed)
Oral Thrush, Adult Oral thrush is an infection in your mouth and throat. It causes white patches on your tongue and in your mouth. Follow these instructions at home: Helping with soreness  To lessen your pain: ? Drink cold liquids, like water and iced tea. ? Eat frozen ice pops or frozen juices. ? Eat foods that are easy to swallow, like gelatin and ice cream. ? Drink from a straw if the patches in your mouth are painful. General instructions   Take or use over-the-counter and prescription medicines only as told by your doctor. Medicine for oral thrush may be something to swallow, or it may be something to put on the infected area.  Eat plain yogurt that has live cultures in it. Read the label to make sure.  If you wear dentures: ? Take out your dentures before you go to bed. ? Brush them well. ? Soak them in a denture cleaner.  Rinse your mouth with warm salt-water many times a day. To make the salt-water mixture, completely dissolve 1/2-1 teaspoon of salt in 1 cup of warm water. Contact a doctor if:  Your problems are getting worse.  Your problems do not get better in less than 7 days with treatment.  Your infection is spreading. This may show as white patches on the skin outside of your mouth.  You are nursing your baby and you have redness and pain in the nipples. This information is not intended to replace advice given to you by your health care provider. Make sure you discuss any questions you have with your health care provider. Document Released: 05/29/2009 Document Revised: 11/27/2015 Document Reviewed: 11/27/2015 Elsevier Interactive Patient Education  2017 Elsevier Inc.  

## 2017-07-01 ENCOUNTER — Ambulatory Visit: Payer: 59 | Admitting: Physical Therapy

## 2017-07-01 DIAGNOSIS — M6281 Muscle weakness (generalized): Secondary | ICD-10-CM

## 2017-07-01 DIAGNOSIS — I890 Lymphedema, not elsewhere classified: Secondary | ICD-10-CM

## 2017-07-01 DIAGNOSIS — M25612 Stiffness of left shoulder, not elsewhere classified: Secondary | ICD-10-CM

## 2017-07-01 NOTE — Therapy (Signed)
[place], [place], [address] Phone: [phone]   Fax:  [phone]  Physical Therapy Treatment  Patient Details  Name: Brianna James MRN: [id number] Date of Birth: 1980-07-05 Referring Provider: Wilber Bihari    Encounter Date: 07/01/2017  PT End of Session - 07/01/17 1714    Visit Number  6    Number of Visits  9    Date for PT Re-Evaluation  07/25/17    PT Start Time  6644 Pt. came late today    PT Stop Time  1349    PT Time Calculation (min)  36 min    Activity Tolerance  Patient tolerated treatment well    Behavior During Therapy  [facility] for tasks assessed/performed       Past Medical History:  Diagnosis Date  . Anemia    low iron  . Breast cancer ([place])   . Cancer ([place]) 01/2016   breast - left  . Chicken pox   . GERD (gastroesophageal reflux disease)    during pregnancy  . Hematoma    left breast  . History of radiation therapy 07/02/16- 08/13/16   Left Breast 50 Gy in 25 fractions, Left Breast Boost 10 Gy in 5 fractions, Left Axilla 45 Gy in 25 fractions.   Marland Kitchen HSV infection   . PONV (postoperative nausea and vomiting)     Past Surgical History:  Procedure Laterality Date  . BREAST LUMPECTOMY Left 2017  . BREAST LUMPECTOMY WITH RADIOACTIVE SEED AND SENTINEL LYMPH NODE BIOPSY Left 01/17/2016   Procedure: LEFT BREAST LUMPECTOMY WITH RADIOACTIVE SEED AND SENTINEL LYMPH NODE BIOPSY;  Surgeon: [name], MD;  Location: [facility];  Service: General;  Laterality: Left;  . EVACUATION BREAST HEMATOMA Left 01/25/2016   Procedure: WASHOUT LEFT BREAST HEMATOMA;  Surgeon: [name], MD;  Location: [facility];  Service: General;  Laterality: Left;  . PORT-A-CATH REMOVAL Right 02/27/2017   Procedure: REMOVAL PORT-A-CATH ERAS PATHWAY;  Surgeon: [name], MD;  Location: [facility];  Service: General;  Laterality: Right;  . PORTACATH PLACEMENT Right 01/17/2016   Procedure: INSERTION PORT-A-CATH WITH [place];  Surgeon: [name], MD;  Location: [facility];  Service: General;  Laterality: Right;  . WISDOM TOOTH EXTRACTION      There were no vitals filed for this visit.  Subjective Assessment - 07/01/17 1314    Subjective  "Other than my leg, I'm okay.  I have no idea what happened, but my knee just gave out."  --Pt. came in with a brace on her knee, which she said she just had at the house.  Says she iced it and elevated it.    Pertinent History  Breast cancer diagnosed December 19 2015 with biopsy followed by a  left lumpectomy with sentinel node biopsy on 01/17/2016 and evacuation of left breast hematoma on 01/25/2016.  She had chemo starting on 02/22/2016 with radiation form 4/17- 5/29/ 2018     Currently in Pain?  Yes    Pain Score  10-Worst pain ever with full weightbearing; 0 at rest    Pain Location  Knee    Pain Orientation  Left    Pain Descriptors / Indicators  Stabbing;Aching    Aggravating Factors   put full weight on the left leg    Pain Relieving Factors  at rest         [facility] PT Assessment - 07/01/17 0001      AROM  Left Shoulder Flexion  137 Degrees    Left Shoulder ABduction  155 Degrees                   [facility] Adult PT Treatment/Exercise - 07/01/17 0001      Exercises   Other Exercises   Instructed in resistance exercise part of strength ABC program, both general principles of starting with 2 sets of 10 reps for a month and increasing weight every 1-2 weeks if able; then instructed in and had patient perform each exercise; she did each exercise 10 reps except squat done only 3 times and step-ups because of recent knee injury pt. also did calf and pect stretches             PT Education - 07/01/17 1713    Education provided  Yes    Education Details  resistance exercise portion of strength ABC program    Person(s) Educated  Patient    Methods  Explanation;Demonstration;Verbal cues     Comprehension  Verbalized understanding;Returned demonstration       PT Short Term Goals - 07/01/17 1319      PT SHORT TERM GOAL #1   Title  Pt will report she has an understanding of lymphedema risk reduction practices     Status  On-going      PT SHORT TERM GOAL #2   Title  Pt will be able to do self manual lymph draiange and use of compression to help with breast lymphedema     Status  Partially Met      PT SHORT TERM GOAL #3   Title  Pt will be independent in home exercise program for shoulder ROM     Status  Achieved        PT Long Term Goals - 07/01/17 1320      PT LONG TERM GOAL #1   Title  Pt will have 160 degrees of left shoulder abduction so that she can perfrom household chores with ease at home     Baseline  140 at eval, 155 on 07/01/17    Status  Partially Met      PT LONG TERM GOAL #2   Title  Pt will improve functional strength of left arm so that she can care for her son without difficulty     Status  Achieved      PT LONG TERM GOAL #3   Title  Pt will be independent in Strength ABC program for shoulder strength     Status  Partially Met      PT LONG TERM GOAL #4   Title  Pt will decrease Quick DASH score to < 10 indicating an improvement in functional strengthe of left UE     Status  On-going            Plan - 07/01/17 1715    Clinical Impression Statement  Patient did well with learning the remaining portion of strength ABC program today, and she is motivated to proceed, in part to lose weight.  She has partially met both short and long term goals at this poing.    Rehab Potential  Good    Clinical Impairments Affecting Rehab Potential  previous radiation, evaluation of hemotoma on left breast     PT Frequency  1x / week    PT Duration  8 weeks    PT Treatment/Interventions  ADLs/Self Care Home Management;Scar mobilization;Passive range of motion;Therapeutic exercise;Manual lymph drainage;Compression bandaging;Taping;Patient/family  education;Manual techniques  PT Next Visit Plan  Check with patient about how HEP is going and make modification as needed; see if she desires more general conditioning exercise. Continue with core strenth and PROM to shouleras needed.     PT Home Exercise Plan  supine scapular series, strength ABC program    Consulted and Agree with Plan of Care  Patient       Patient will benefit from skilled therapeutic intervention in order to improve the following deficits and impairments:  Decreased knowledge of use of DME, Increased fascial restricitons, Pain, Postural dysfunction, Decreased scar mobility, Decreased range of motion, Decreased strength, Impaired perceived functional ability, Impaired UE functional use, Obesity, Increased edema, Decreased knowledge of precautions  Visit Diagnosis: Stiffness of left shoulder joint  Lymphedema, not elsewhere classified  Muscle weakness (generalized)     Problem List Patient Active Problem List   Diagnosis Date Noted  . Malignant neoplasm of upper-outer quadrant of left breast in female, estrogen receptor negative ([place]) 01/03/2016    SALIS[place],DONNA 07/01/2017, 5:17 PM  [place], [place], [address] Phone: [phone]   Fax:  [phone]  Name: Brianna James MRN: [id number] Date of Birth: June 24, 1980  Serafina Royals, PT 07/01/17 5:18 PM

## 2017-07-03 ENCOUNTER — Encounter: Payer: Self-pay | Admitting: Nurse Practitioner

## 2017-07-03 ENCOUNTER — Ambulatory Visit (INDEPENDENT_AMBULATORY_CARE_PROVIDER_SITE_OTHER): Payer: 59 | Admitting: Nurse Practitioner

## 2017-07-03 ENCOUNTER — Other Ambulatory Visit: Payer: Self-pay | Admitting: Nurse Practitioner

## 2017-07-03 VITALS — BP 118/68 | HR 84 | Temp 98.6°F | Ht 67.0 in | Wt 230.6 lb

## 2017-07-03 DIAGNOSIS — J0140 Acute pansinusitis, unspecified: Secondary | ICD-10-CM

## 2017-07-03 DIAGNOSIS — M25562 Pain in left knee: Secondary | ICD-10-CM

## 2017-07-03 DIAGNOSIS — J209 Acute bronchitis, unspecified: Secondary | ICD-10-CM

## 2017-07-03 MED ORDER — NAPROXEN 500 MG PO TABS: 500.0000 mg | ORAL_TABLET | Freq: Two times a day (BID) | ORAL | 0 refills | Status: DC | PRN

## 2017-07-03 MED ORDER — KNEE BRACE/HINGED L/XL MISC: 1.0000 "application " | Freq: Every day | 0 refills | Status: DC

## 2017-07-03 MED ORDER — TRAMADOL HCL 50 MG PO TABS: 50.0000 mg | ORAL_TABLET | Freq: Two times a day (BID) | ORAL | 0 refills | Status: DC | PRN

## 2017-07-03 NOTE — Progress Notes (Signed)
Subjective:  Patient ID: Brianna James, female    DOB: May 16, 1980  Age: 37 y.o. MRN: [id number]  CC: Knee Pain (a week ago, dancing and turned wrong, pain started. cannot put presure on it.)  Knee Pain   The incident occurred 5 to 7 days ago. The incident occurred at home. The injury mechanism was a twisting injury (while dancing a [employer] [employer]). The pain is present in the left knee. Quality: throbbing. The pain is at a severity of 10/10. The pain is severe. The pain has been fluctuating since onset. Associated symptoms include an inability to bear weight. Pertinent negatives include no loss of motion, loss of sensation, muscle weakness, numbness or tingling. She reports no foreign bodies present. The symptoms are aggravated by weight bearing. She has tried rest and immobilization for the symptoms. The treatment provided mild relief.   Outpatient Medications Prior to Visit  Medication Sig Dispense Refill  . albuterol (PROVENTIL HFA;VENTOLIN HFA) 108 (90 Base) MCG/ACT inhaler INHALE 1-2 PUFFS INTO THE LUNGS EVERY 6 (SIX) HOURS AS NEEDED. 8.5 Inhaler 0  . fluconazole (DIFLUCAN) 150 MG tablet TAKE 1 TABLET BY MOUTH AS ONE DOSE  0  . fluticasone (FLONASE) 50 MCG/ACT nasal spray SPRAY 2 SPRAYS INTO EACH NOSTRIL EVERY DAY (Patient not taking: Reported on 07/03/2017) 16 g 0   No facility-administered medications prior to visit.     ROS See HPI  Objective:  BP 118/68 (BP Location: Right Arm, Patient Position: Sitting, Cuff Size: Normal)   Pulse 84   Temp 98.6 F (37 C) (Oral)   Ht 5\' 7"  (1.702 m)   Wt 230 lb 9.6 oz (104.6 kg)   LMP 06/10/2017   SpO2 97%   BMI 36.12 kg/m   BP Readings from Last 3 Encounters:  07/03/17 118/68  06/24/17 124/82  06/06/17 138/78    Wt Readings from Last 3 Encounters:  07/03/17 230 lb 9.6 oz (104.6 kg)  06/24/17 233 lb (105.7 kg)  06/06/17 236 lb 12.8 oz (107.4 kg)    Physical Exam  Constitutional: She is oriented to person, place, and time. No distress.   Cardiovascular: Normal rate.  Pulmonary/Chest: Effort normal.  Musculoskeletal: She exhibits no deformity.       Left knee: She exhibits effusion. She exhibits normal range of motion, no erythema, normal alignment and normal patellar mobility. No tenderness found.       Left ankle: Normal.       Left upper leg: Normal.       Left lower leg: Normal.  Limping gait.  Neurological: She is alert and oriented to person, place, and time.  Skin: Skin is warm and dry. No erythema.  Vitals reviewed.   Lab Results  Component Value Date   WBC 8.0 02/27/2017   HGB 11.3 (L) 02/27/2017   HCT 35.4 (L) 02/27/2017   PLT 247 02/27/2017   GLUCOSE 88 02/27/2017   ALT 7 01/02/2017   AST 19 01/02/2017   NA 135 02/27/2017   K 3.9 02/27/2017   CL 104 02/27/2017   CREATININE 0.97 02/27/2017   BUN 5 (L) 02/27/2017   CO2 22 02/27/2017    US Breast Ltd Uni Left Inc Axilla  Addendum Date: 06/04/2017   ADDENDUM REPORT: 06/04/2017 08:48 ADDENDUM: This is a left diagnostic mammogram and ultrasound. Electronically Signed   By: Dorise Bullion III M.D   On: 06/04/2017 08:48   Addendum Date: 06/03/2017   ADDENDUM REPORT: 06/03/2017 16:18 ADDENDUM: In addition to previous recommendations, recommend continued  annual diagnostic mammography. Electronically Signed   By: Dorise Bullion III M.D   On: 06/03/2017 16:18   Result Date: 06/04/2017 CLINICAL DATA:  Left lumpectomy 2 years ago at the age of 21. The patient's grandmother was diagnosed with breast cancer in her 65s as well. The patient has noticed a subtle indention along the scar. She has also noticed increased hardness to the breast and tenderness in this region. The patient also describes some changes in skin color. However, upon physical exam, there is no skin thickening or erythema in the region of the scar. EXAM: DIGITAL DIAGNOSTIC BILATERAL MAMMOGRAM WITH CAD AND TOMO ULTRASOUND LEFT BREAST COMPARISON:  Previous exam(s). ACR Breast Density Category b:  There are scattered areas of fibroglandular density. FINDINGS: The patient appears to be feeling the left lumpectomy site. There is fat necrosis at the lumpectomy site. There is mild increased skin thickening in the medial inferior left breast. No other suspicious mammographic findings. Mammographic images were processed with CAD. On physical exam, no skin dimpling or erythema is identified. There is some firmness without a discrete lump in the region of the patient's previous surgery. Targeted ultrasound is performed, showing a seroma and postoperative changes in the region of the patient's symptoms. IMPRESSION: The patient appears to be feeling evolving postoperative changes and fat necrosis. The patient is being seen by her physical therapist for lymphedema in the medial inferior left breast which may explain the mammographic findings. No suspicious imaging findings otherwise seen. RECOMMENDATION: Given the patient's early age of diagnosis and physical exam changes, recommend breast MRI for further assessment of the left breast. This was discussed with the patient. If the MRI is negative for malignancy recommend physical exam by the patient's surgeon of the medial inferior left breast. A punch biopsy could be performed if clinically warranted. However, I suspect the mild increased skin thickening in this region may be lymphedema given history. I have discussed the findings and recommendations with the patient. Results were also provided in writing at the conclusion of the visit. If applicable, a reminder letter will be sent to the patient regarding the next appointment. BI-RADS CATEGORY  2: Benign. Electronically Signed: By: Dorise Bullion III M.D On: 06/03/2017 10:37   Mm Diag Breast Tomo Uni Left  Addendum Date: 06/04/2017   ADDENDUM REPORT: 06/04/2017 08:48 ADDENDUM: This is a left diagnostic mammogram and ultrasound. Electronically Signed   By: Dorise Bullion III M.D   On: 06/04/2017 08:48   Addendum  Date: 06/03/2017   ADDENDUM REPORT: 06/03/2017 16:18 ADDENDUM: In addition to previous recommendations, recommend continued annual diagnostic mammography. Electronically Signed   By: Dorise Bullion III M.D   On: 06/03/2017 16:18   Result Date: 06/04/2017 CLINICAL DATA:  Left lumpectomy 2 years ago at the age of 22. The patient's grandmother was diagnosed with breast cancer in her 36s as well. The patient has noticed a subtle indention along the scar. She has also noticed increased hardness to the breast and tenderness in this region. The patient also describes some changes in skin color. However, upon physical exam, there is no skin thickening or erythema in the region of the scar. EXAM: DIGITAL DIAGNOSTIC BILATERAL MAMMOGRAM WITH CAD AND TOMO ULTRASOUND LEFT BREAST COMPARISON:  Previous exam(s). ACR Breast Density Category b: There are scattered areas of fibroglandular density. FINDINGS: The patient appears to be feeling the left lumpectomy site. There is fat necrosis at the lumpectomy site. There is mild increased skin thickening in the medial inferior left  breast. No other suspicious mammographic findings. Mammographic images were processed with CAD. On physical exam, no skin dimpling or erythema is identified. There is some firmness without a discrete lump in the region of the patient's previous surgery. Targeted ultrasound is performed, showing a seroma and postoperative changes in the region of the patient's symptoms. IMPRESSION: The patient appears to be feeling evolving postoperative changes and fat necrosis. The patient is being seen by her physical therapist for lymphedema in the medial inferior left breast which may explain the mammographic findings. No suspicious imaging findings otherwise seen. RECOMMENDATION: Given the patient's early age of diagnosis and physical exam changes, recommend breast MRI for further assessment of the left breast. This was discussed with the patient. If the MRI is negative  for malignancy recommend physical exam by the patient's surgeon of the medial inferior left breast. A punch biopsy could be performed if clinically warranted. However, I suspect the mild increased skin thickening in this region may be lymphedema given history. I have discussed the findings and recommendations with the patient. Results were also provided in writing at the conclusion of the visit. If applicable, a reminder letter will be sent to the patient regarding the next appointment. BI-RADS CATEGORY  2: Benign. Electronically Signed: By: Dorise Bullion III M.D On: 06/03/2017 10:37    Assessment & Plan:   Brianna James was seen today for knee pain.  Diagnoses and all orders for this visit:  Anterior knee pain, left -     Ambulatory referral to Sports Medicine -     Elastic Bandages & Supports (KNEE BRACE/HINGED L/XL) MISC; 1 application by Does not apply route daily. -     naproxen (NAPROSYN) 500 MG tablet; Take 1 tablet (500 mg total) by mouth 2 (two) times daily as needed (for pain, take with food). -     traMADol (ULTRAM) 50 MG tablet; Take 1 tablet (50 mg total) by mouth every 12 (twelve) hours as needed.   I am having Brianna James start on Knee Brace/Hinged L/XL, naproxen, and traMADol. I am also having her maintain her albuterol, fluticasone, and fluconazole.  Meds ordered this encounter  Medications  . Elastic Bandages & Supports (KNEE BRACE/HINGED L/XL) MISC    Sig: 1 application by Does not apply route daily.    Dispense:  1 each    Refill:  0    Order Specific Question:   Supervising Provider    Answer:   Lucille Passy [[id number]]  . naproxen (NAPROSYN) 500 MG tablet    Sig: Take 1 tablet (500 mg total) by mouth 2 (two) times daily as needed (for pain, take with food).    Dispense:  30 tablet    Refill:  0    Order Specific Question:   Supervising Provider    Answer:   Lucille Passy [[id number]]  . traMADol (ULTRAM) 50 MG tablet    Sig: Take 1 tablet (50 mg total) by mouth every  12 (twelve) hours as needed.    Dispense:  6 tablet    Refill:  0    Order Specific Question:   Supervising Provider    Answer:   Lucille Passy [[id number]]    Follow-up: Return if symptoms worsen or fail to improve.  Brianna Lacy, NP

## 2017-07-03 NOTE — Patient Instructions (Addendum)
Current brace is small. Need to get L/XL hinged knee brace at retail pharmacy or medical supply store. Make appt with sports medicine if no improvement within 1week.  Knee Sprain A knee sprain is a stretch or tear in a knee ligament. Knee ligaments are bands of tissue that connect bones in the knee to each other. Follow these instructions at home: If you have a splint or brace:  Wear the splint or brace as told by your doctor. Remove it only as told by your doctor.  Loosen the splint or brace if your toes tingle, get numb, or turn cold and blue.  Keep the splint or brace clean.  If the splint or brace is not waterproof: ? Do not let it get wet. ? Cover it with a watertight covering when you take a bath or a shower. If you have a cast:  Do not stick anything inside the cast to scratch your skin.  Check the skin around the cast every day. Tell your doctor about any concerns.  You may put lotion on dry skin around the edges of the cast. Do not put lotion on the skin underneath the cast.  Keep the cast clean.  If the cast is not waterproof: ? Do not let it get wet. ? Cover it with a watertight covering when you take a bath or a shower. Managing pain, stiffness, and swelling  Gently move your toes often to avoid stiffness and to lessen swelling.  Raise (elevate) the injured area above the level of your heart while you are sitting or lying down.  Take over-the-counter and prescription medicines only as told by your doctor.  If directed, put ice on the injured area. ? If you have a removable splint or brace, remove it as told by your doctor. ? Put ice in a plastic bag. ? Place a towel between your skin and the bag or between your cast and the bag. ? Leave the ice on for 20 minutes, 2-3 times a day. General instructions  Do exercises as told by your doctor.  Keep all follow-up visits as told by your doctor. This is important. Contact a doctor if:  You have pain that gets  worse.  The cast, brace, or splint does not fit right.  The cast, brace, or splint gets damaged. Get help right away if:  You cannot lean on your knee to stand or walk.  You cannot move the injured area.  You knee buckles or you have pain after you walk only a few steps.  You have very bad pain, swelling, or numbness below the cast, brace, or splint. Summary  A knee sprain is a stretch or tear in a band (ligament) that connects your knee bones to each other.  You may need to wear a splint, brace, or cast to help your knee get better.  Contact your doctor if you have very bad pain, swelling, or numbness, or if you cannot walk. This information is not intended to replace advice given to you by your health care provider. Make sure you discuss any questions you have with your health care provider. Document Released: 02/20/2009 Document Revised: 11/21/2015 Document Reviewed: 11/21/2015 Elsevier Interactive Patient Education  2017 [employer].

## 2017-07-08 ENCOUNTER — Ambulatory Visit: Payer: 59 | Admitting: Family Medicine

## 2017-07-08 DIAGNOSIS — M25562 Pain in left knee: Secondary | ICD-10-CM | POA: Diagnosis not present

## 2017-07-08 DIAGNOSIS — J029 Acute pharyngitis, unspecified: Secondary | ICD-10-CM | POA: Diagnosis not present

## 2017-07-08 DIAGNOSIS — B370 Candidal stomatitis: Secondary | ICD-10-CM | POA: Diagnosis not present

## 2017-07-09 ENCOUNTER — Ambulatory Visit: Payer: 59 | Admitting: Family Medicine

## 2017-07-09 ENCOUNTER — Encounter: Payer: Self-pay | Admitting: Primary Care

## 2017-07-09 ENCOUNTER — Encounter

## 2017-07-09 ENCOUNTER — Ambulatory Visit (INDEPENDENT_AMBULATORY_CARE_PROVIDER_SITE_OTHER): Payer: 59 | Admitting: Primary Care

## 2017-07-09 VITALS — BP 96/62 | HR 91 | Temp 98.6°F | Ht 67.0 in | Wt 231.0 lb

## 2017-07-09 DIAGNOSIS — R070 Pain in throat: Secondary | ICD-10-CM

## 2017-07-09 LAB — POCT RAPID STREP A (OFFICE): Rapid Strep A Screen: NEGATIVE

## 2017-07-09 NOTE — Progress Notes (Signed)
Subjective:    Patient ID: Brianna James, female    DOB: 1980/06/26, 37 y.o.   MRN: [id number]  HPI Brianna James is a 37 y.o. female who presents today with a of Thrush. Started 2 days with pain in posterior oropharynx. Rates pain originally achey 9/10 and did salt gargling and now 2/10. Only complains of pain upon swallowing. Denies fever, chills, ear pain, nasal discharge.   Review of Systems  Constitutional: Negative for appetite change.  HENT: Negative for congestion, ear pain, hearing loss, postnasal drip and trouble swallowing.   Respiratory: Negative for cough and shortness of breath.   Cardiovascular: Negative for chest pain.      Past Medical History:  Diagnosis Date  . Anemia    low iron  . Breast cancer ([place])   . Cancer ([place]) 01/2016   breast - left  . Chicken pox   . GERD (gastroesophageal reflux disease)    during pregnancy  . Hematoma    left breast  . History of radiation therapy 07/02/16- 08/13/16   Left Breast 50 Gy in 25 fractions, Left Breast Boost 10 Gy in 5 fractions, Left Axilla 45 Gy in 25 fractions.   [facility] Kitchen HSV infection   . PONV (postoperative nausea and vomiting)    Past Surgical History:  Procedure Laterality Date  . BREAST LUMPECTOMY Left 2017  . BREAST LUMPECTOMY WITH RADIOACTIVE SEED AND SENTINEL LYMPH NODE BIOPSY Left 01/17/2016   Procedure: LEFT BREAST LUMPECTOMY WITH RADIOACTIVE SEED AND SENTINEL LYMPH NODE BIOPSY;  Surgeon: [name], MD;  Location: [facility];  Service: General;  Laterality: Left;  . EVACUATION BREAST HEMATOMA Left 01/25/2016   Procedure: WASHOUT LEFT BREAST HEMATOMA;  Surgeon: [name], MD;  Location: [facility];  Service: General;  Laterality: Left;  . PORT-A-CATH REMOVAL Right 02/27/2017   Procedure: REMOVAL PORT-A-CATH ERAS PATHWAY;  Surgeon: [name], MD;  Location: [facility];  Service: General;  Laterality: Right;  . PORTACATH PLACEMENT Right 01/17/2016   Procedure: INSERTION  PORT-A-CATH WITH [place];  Surgeon: [name], MD;  Location: [facility];  Service: General;  Laterality: Right;  . WISDOM TOOTH EXTRACTION     Social History   Socioeconomic History  . Marital status: Married    Spouse name: Not on file  . Number of children: Not on file  . Years of education: Not on file  . Highest education level: Not on file  Occupational History  . Not on file  Social Needs  . Financial resource strain: Not on file  . Food insecurity:    Worry: Not on file    Inability: Not on file  . Transportation needs:    Medical: Not on file    Non-medical: Not on file  Tobacco Use  . Smoking status: Never Smoker  . Smokeless tobacco: Never Used  Substance and Sexual Activity  . Alcohol use: Yes    Alcohol/week: 0.0 oz    Comment: rare - wine maybe 2x per yr  . Drug use: No  . Sexual activity: Yes    Birth control/protection: None  Lifestyle  . Physical activity:    Days per week: Not on file    Minutes per session: Not on file  . Stress: Not on file  Relationships  . Social connections:    Talks on phone: Not on file    Gets together: Not on file    Attends religious service: Not on file    Active  member of club or organization: Not on file    Attends meetings of clubs or organizations: Not on file    Relationship status: Not on file  . Intimate partner violence:    Fear of current or ex partner: Not on file    Emotionally abused: Not on file    Physically abused: Not on file    Forced sexual activity: Not on file  Other Topics Concern  . Not on file  Social History Narrative  . Not on file   Family History  Problem Relation Age of Onset  . Arthritis Mother   . Stroke Mother   . Stroke Maternal Aunt   . Stroke Maternal Uncle   . Breast cancer Maternal Grandmother        dx at 26 or younger; d. 64y  . Lung cancer Maternal Grandfather   . Stroke Paternal Grandmother   . Uterine cancer Paternal Grandmother        dx. 61-62  .  Other Paternal Grandmother        colostomy bag in her 7s; unspecified reason  . Lung cancer Paternal Grandfather   . Uterine cancer Maternal Aunt        dx unspecified age  . Breast cancer Other        maternal great grandmother (MGM's mother) d. 41   Current Outpatient Medications on File Prior to Visit  Medication Sig Dispense Refill  . albuterol (PROVENTIL HFA;VENTOLIN HFA) 108 (90 Base) MCG/ACT inhaler INHALE 1-2 PUFFS INTO THE LUNGS EVERY 6 (SIX) HOURS AS NEEDED. 8.5 Inhaler 0  . Elastic Bandages & Supports (KNEE BRACE/HINGED L/XL) MISC 1 application by Does not apply route daily. 1 each 0  . fluconazole (DIFLUCAN) 150 MG tablet TAKE 1 TABLET BY MOUTH AS ONE DOSE  0  . fluticasone (FLONASE) 50 MCG/ACT nasal spray SPRAY 2 SPRAYS INTO EACH NOSTRIL EVERY DAY 16 g 0  . naproxen (NAPROSYN) 500 MG tablet Take 1 tablet (500 mg total) by mouth 2 (two) times daily as needed (for pain, take with food). 30 tablet 0  . traMADol (ULTRAM) 50 MG tablet Take 1 tablet (50 mg total) by mouth every 12 (twelve) hours as needed. 6 tablet 0   No current facility-administered medications on file prior to visit.      Objective:   Physical Exam  Constitutional: She appears well-nourished. No distress.  HENT:  Right Ear: Hearing normal. Tympanic membrane is bulging. Tympanic membrane is not erythematous.  Left Ear: Tympanic membrane, external ear and ear canal normal. Tympanic membrane is not erythematous and not bulging.  Mouth/Throat: Mucous membranes are normal. Oropharyngeal exudate present. No posterior oropharyngeal edema or posterior oropharyngeal erythema.  Cardiovascular: Normal rate, regular rhythm and normal heart sounds. Exam reveals no gallop and no friction rub.  No murmur heard. Pulmonary/Chest: Effort normal and breath sounds normal.  Lymphadenopathy:    She has no cervical adenopathy.   BP 96/62 (BP Location: Right Arm, Patient Position: Sitting, Cuff Size: Normal)   Pulse 91   Temp  98.6 F (37 C) (Oral)   Ht 5\' 7"  (1.702 m)   Wt 231 lb (104.8 kg)   LMP 07/09/2017   SpO2 96%   BMI 36.18 kg/m   BP Readings from Last 3 Encounters:  07/09/17 96/62  07/03/17 118/68  06/24/17 124/82      Assessment & Plan:    1. Throat pain - Rapid strep negative - APAP 650mg  Q6H PRN or IBU 400mg  Q6H PRN -  Flonase 1 spray each nostril two times a day - Salt water gargles  Denita Lung, RN, Adult-Geriatric Nurse Practitioner Student

## 2017-07-09 NOTE — Patient Instructions (Addendum)
You strep test came back negative.   Your ear has some pressure behind it. You can use OTC Flonase 1 spray each nostril two times a day  For pain you can take Tylenol 650mg  every 6 hours as needed (do not exceed 3000mg /day)  OR  you can take Ibuprofen 400mg  ever 6hrs as needed ( do not exceed 2400mg /day). You can continue warm salt water gargles as needed   Please follow up if your symptoms do not improve in the next 3-5 days or sooner if your symptoms get worse.  It has been a pleasure seeing you today. Denita Lung, RN, Adult-Geriatric Nurse Practitioner Student and Allie Bossier, AGNP

## 2017-07-09 NOTE — Progress Notes (Signed)
Subjective:    Patient ID: Brianna James, female    DOB: 1980/08/05, 37 y.o.   MRN: [id number]  HPI  Brianna James is a 37 year old female who presents today with a chief complaint of sore throat.  She was evaluated and treated on 06/24/17 with complaints of vaginal discharge and itching. Her wet prep was positive for yeast so she was treated with Diflucan 150 mg tablet for one dose.   Her sore throat began two days ago and is located to the posterior pharynx. She did notice a "white patch" to the left posterior throat yesterday, has noticed some today but on a lesser scale. She did notice right ear pain for a five minute duration yesterday, no symptoms since. Denies fevers, chills, post nasal drip, cough, sinus pressure. She's been gargling salt water without improvement.   Review of Systems  Constitutional: Negative for fever.  HENT: Positive for sore throat. Negative for congestion, postnasal drip, rhinorrhea and sinus pressure.   Respiratory: Negative for cough and shortness of breath.        Past Medical History:  Diagnosis Date  . Anemia    low iron  . Breast cancer ([place])   . Cancer ([place]) 01/2016   breast - left  . Chicken pox   . GERD (gastroesophageal reflux disease)    during pregnancy  . Hematoma    left breast  . History of radiation therapy 07/02/16- 08/13/16   Left Breast 50 Gy in 25 fractions, Left Breast Boost 10 Gy in 5 fractions, Left Axilla 45 Gy in 25 fractions.   Marland Kitchen HSV infection   . PONV (postoperative nausea and vomiting)      Social History   Socioeconomic History  . Marital status: Married    Spouse name: Not on file  . Number of children: Not on file  . Years of education: Not on file  . Highest education level: Not on file  Occupational History  . Not on file  Social Needs  . Financial resource strain: Not on file  . Food insecurity:    Worry: Not on file    Inability: Not on file  . Transportation needs:    Medical: Not on file   Non-medical: Not on file  Tobacco Use  . Smoking status: Never Smoker  . Smokeless tobacco: Never Used  Substance and Sexual Activity  . Alcohol use: Yes    Alcohol/week: 0.0 oz    Comment: rare - wine maybe 2x per yr  . Drug use: No  . Sexual activity: Yes    Birth control/protection: None  Lifestyle  . Physical activity:    Days per week: Not on file    Minutes per session: Not on file  . Stress: Not on file  Relationships  . Social connections:    Talks on phone: Not on file    Gets together: Not on file    Attends religious service: Not on file    Active member of club or organization: Not on file    Attends meetings of clubs or organizations: Not on file    Relationship status: Not on file  . Intimate partner violence:    Fear of current or ex partner: Not on file    Emotionally abused: Not on file    Physically abused: Not on file    Forced sexual activity: Not on file  Other Topics Concern  . Not on file  Social History Narrative  . Not on file  Past Surgical History:  Procedure Laterality Date  . BREAST LUMPECTOMY Left 2017  . BREAST LUMPECTOMY WITH RADIOACTIVE SEED AND SENTINEL LYMPH NODE BIOPSY Left 01/17/2016   Procedure: LEFT BREAST LUMPECTOMY WITH RADIOACTIVE SEED AND SENTINEL LYMPH NODE BIOPSY;  Surgeon: [name], MD;  Location: [place];  Service: General;  Laterality: Left;  . EVACUATION BREAST HEMATOMA Left 01/25/2016   Procedure: WASHOUT LEFT BREAST HEMATOMA;  Surgeon: [name], MD;  Location: [facility] [facility];  Service: General;  Laterality: Left;  . PORT-A-CATH REMOVAL Right 02/27/2017   Procedure: REMOVAL PORT-A-CATH ERAS PATHWAY;  Surgeon: [name], MD;  Location: [place];  Service: General;  Laterality: Right;  . PORTACATH PLACEMENT Right 01/17/2016   Procedure: INSERTION PORT-A-CATH WITH [place];  Surgeon: [name], MD;  Location: [place];  Service: General;  Laterality: Right;  . WISDOM TOOTH  EXTRACTION      Family History  Problem Relation Age of Onset  . Arthritis Mother   . Stroke Mother   . Stroke Maternal Aunt   . Stroke Maternal Uncle   . Breast cancer Maternal Grandmother        dx at 62 or younger; d. 42y  . Lung cancer Maternal Grandfather   . Stroke Paternal Grandmother   . Uterine cancer Paternal Grandmother        dx. 61-62  . Other Paternal Grandmother        colostomy bag in her 40s; unspecified reason  . Lung cancer Paternal Grandfather   . Uterine cancer Maternal Aunt        dx unspecified age  . Breast cancer Other        maternal great grandmother (MGM's mother) d. 62    Allergies  Allergen Reactions  . Other Swelling    Magic Mouthwash w/ lidocaine  . Oxycodone Nausea And Vomiting    Pt reports adverse reaction.   . Peanuts [Peanut Oil] Hives    Current Outpatient Medications on File Prior to Visit  Medication Sig Dispense Refill  . albuterol (PROVENTIL HFA;VENTOLIN HFA) 108 (90 Base) MCG/ACT inhaler INHALE 1-2 PUFFS INTO THE LUNGS EVERY 6 (SIX) HOURS AS NEEDED. 8.5 Inhaler 0  . Elastic Bandages & Supports (KNEE BRACE/HINGED L/XL) MISC 1 application by Does not apply route daily. 1 each 0  . fluconazole (DIFLUCAN) 150 MG tablet TAKE 1 TABLET BY MOUTH AS ONE DOSE  0  . fluticasone (FLONASE) 50 MCG/ACT nasal spray SPRAY 2 SPRAYS INTO EACH NOSTRIL EVERY DAY 16 g 0  . naproxen (NAPROSYN) 500 MG tablet Take 1 tablet (500 mg total) by mouth 2 (two) times daily as needed (for pain, take with food). 30 tablet 0  . traMADol (ULTRAM) 50 MG tablet Take 1 tablet (50 mg total) by mouth every 12 (twelve) hours as needed. 6 tablet 0   No current facility-administered medications on file prior to visit.     BP 96/62 (BP Location: Right Arm, Patient Position: Sitting, Cuff Size: Normal)   Pulse 91   Temp 98.6 F (37 C) (Oral)   Ht 5\' 7"  (1.702 m)   Wt 231 lb (104.8 kg)   LMP 07/09/2017   SpO2 96%   BMI 36.18 kg/m    Objective:   Physical Exam    Constitutional: She appears well-nourished.  HENT:  Right Ear: Ear canal normal. Tympanic membrane is bulging. Tympanic membrane is not erythematous.  Left Ear: Tympanic membrane and ear canal normal.  Nose: Right sinus exhibits no  maxillary sinus tenderness and no frontal sinus tenderness. Left sinus exhibits no maxillary sinus tenderness and no frontal sinus tenderness.  Mouth/Throat: Oropharynx is clear and moist.    Small amount of whitish accumulation to left posterior pharynx. No erythema or edema.  Eyes: Conjunctivae are normal.  Neck: Neck supple.  Cardiovascular: Normal rate and regular rhythm.  Pulmonary/Chest: Effort normal and breath sounds normal. She has no wheezes. She has no rales.  Lymphadenopathy:    She has no cervical adenopathy.  Skin: Skin is warm and dry.          Assessment & Plan:  Pharyngitis:  Sore throat x 2 days, no other symptoms. Exam today overall unremarkable. White substance doesn't appear to be thrush. No thrush apparent to tongue. Doubt thrush, especially given recent treatment with Diflucan.  Rapid Strep: Negative Will have her trial Flonase, Tylenol/Iburpofen PRN.  Return precautions provided.  Pleas Koch, NP

## 2017-07-15 ENCOUNTER — Ambulatory Visit: Payer: 59

## 2017-07-15 ENCOUNTER — Ambulatory Visit: Payer: 59 | Admitting: Sports Medicine

## 2017-07-15 DIAGNOSIS — M25612 Stiffness of left shoulder, not elsewhere classified: Secondary | ICD-10-CM

## 2017-07-15 DIAGNOSIS — M25562 Pain in left knee: Secondary | ICD-10-CM | POA: Diagnosis not present

## 2017-07-15 NOTE — Therapy (Addendum)
[place], [place], [address] Phone: [phone]   Fax:  [phone]  Physical Therapy Treatment  Patient Details  Name: ARLINA SABINA MRN: [id number] Date of Birth: 03/08/1981 Referring Provider: Wilber Bihari    Encounter Date: 07/15/2017  PT End of Session - 07/15/17 1157    Visit Number  7    Number of Visits  9    Date for PT Re-Evaluation  07/25/17    PT Start Time  1118 Pt arrived late    PT Stop Time  1147    PT Time Calculation (min)  29 min    Activity Tolerance  Patient limited by pain;Patient tolerated treatment well    Behavior During Therapy  [facility] for tasks assessed/performed       Past Medical History:  Diagnosis Date  . Anemia    low iron  . Breast cancer ([place])   . Cancer ([place]) 01/2016   breast - left  . Chicken pox   . GERD (gastroesophageal reflux disease)    during pregnancy  . Hematoma    left breast  . History of radiation therapy 07/02/16- 08/13/16   Left Breast 50 Gy in 25 fractions, Left Breast Boost 10 Gy in 5 fractions, Left Axilla 45 Gy in 25 fractions.   Marland Kitchen HSV infection   . PONV (postoperative nausea and vomiting)     Past Surgical History:  Procedure Laterality Date  . BREAST LUMPECTOMY Left 2017  . BREAST LUMPECTOMY WITH RADIOACTIVE SEED AND SENTINEL LYMPH NODE BIOPSY Left 01/17/2016   Procedure: LEFT BREAST LUMPECTOMY WITH RADIOACTIVE SEED AND SENTINEL LYMPH NODE BIOPSY;  Surgeon: [name], MD;  Location: [facility];  Service: General;  Laterality: Left;  . EVACUATION BREAST HEMATOMA Left 01/25/2016   Procedure: WASHOUT LEFT BREAST HEMATOMA;  Surgeon: [name], MD;  Location: [place];  Service: General;  Laterality: Left;  . PORT-A-CATH REMOVAL Right 02/27/2017   Procedure: REMOVAL PORT-A-CATH ERAS PATHWAY;  Surgeon: [name], MD;  Location: [place];  Service: General;  Laterality: Right;  . PORTACATH PLACEMENT  Right 01/17/2016   Procedure: INSERTION PORT-A-CATH WITH [place];  Surgeon: [name], MD;  Location: [place];  Service: General;  Laterality: Right;  . WISDOM TOOTH EXTRACTION      There were no vitals filed for this visit.  Subjective Assessment - 07/15/17 1121    Subjective  I went to Sports Medicine and saw Dr. Rip Harbour and he ordered an MRI which I will go get right after I leave here today. I just can't put any weight on my knee so I haven't been able to do the HEP she gave me last time like I want too.     Pertinent History  Breast cancer diagnosed December 19 2015 with biopsy followed by a  left lumpectomy with sentinel node biopsy on 01/17/2016 and evacuation of left breast hematoma on 01/25/2016.  She had chemo starting on 02/22/2016 with radiation form 4/17- 5/29/ 2018     Patient Stated Goals  to get shoulder range of motion back and get into an exercise program     Currently in Pain?  Yes    Pain Score  9  0/10 NWB/seated    Pain Location  Knee    Pain Orientation  Left    Pain Descriptors / Indicators  Aching;Stabbing    Aggravating Factors   FWB    Pain Relieving Factors  at rest  [facility] Adult PT Treatment/Exercise - 07/15/17 0001      Manual Therapy   Manual Therapy  Passive ROM    Passive ROM  In Supine: To Lt shoulder in to flexion, abduction and D2 to pts tolerance               PT Short Term Goals - 07/01/17 1319      PT SHORT TERM GOAL #1   Title  Pt will report she has an understanding of lymphedema risk reduction practices     Status  On-going      PT SHORT TERM GOAL #2   Title  Pt will be able to do self manual lymph draiange and use of compression to help with breast lymphedema     Status  Partially Met      PT SHORT TERM GOAL #3   Title  Pt will be independent in home exercise program for shoulder ROM     Status  Achieved        PT Long Term Goals - 07/01/17 1320      PT LONG TERM GOAL #1    Title  Pt will have 160 degrees of left shoulder abduction so that she can perfrom household chores with ease at home     Baseline  140 at eval, 155 on 07/01/17    Status  Partially Met      PT LONG TERM GOAL #2   Title  Pt will improve functional strength of left arm so that she can care for her son without difficulty     Status  Achieved      PT LONG TERM GOAL #3   Title  Pt will be independent in Strength ABC program for shoulder strength     Status  Partially Met      PT LONG TERM GOAL #4   Title  Pt will decrease Quick DASH score to < 10 indicating an improvement in functional strengthe of left UE     Status  On-going            Plan - 07/15/17 1158    Clinical Impression Statement  Pt still presents with some limitation to end ROM. So as pt is unable to do anything in standing due to Lt knee pain focused on end P/ROM of Lt shoulder. She reports shoulder feeling looser at end of session. Pt has MRI after session today and no future appointments with [place] scheduled at this time. Plan to wait until she gets MRI results to schedule future visits.     Rehab Potential  Good    Clinical Impairments Affecting Rehab Potential  previous radiation, evaluation of hemotoma on left breast     PT Frequency  1x / week    PT Duration  8 weeks    PT Treatment/Interventions  ADLs/Self Care Home Management;Scar mobilization;Passive range of motion;Therapeutic exercise;Manual lymph drainage;Compression bandaging;Taping;Patient/family education;Manual techniques    PT Next Visit Plan  Assess results from MRI for Lt knee and check with patient about how HEP is going and make modification as needed; see if she desires more general conditioning exercise. Continue with core strenth and PROM to shoulders needed.     PT Home Exercise Plan  supine scapular series, strength ABC program    Consulted and Agree with Plan of Care  Patient       Patient will benefit from skilled therapeutic intervention in order  to improve the following deficits and impairments:  Decreased knowledge of  use of DME, Increased fascial restricitons, Pain, Postural dysfunction, Decreased scar mobility, Decreased range of motion, Decreased strength, Impaired perceived functional ability, Impaired UE functional use, Obesity, Increased edema, Decreased knowledge of precautions  Visit Diagnosis: Stiffness of left shoulder joint     Problem List Patient Active Problem List   Diagnosis Date Noted  . Malignant neoplasm of upper-outer quadrant of left breast in female, estrogen receptor negative ([place]) 01/03/2016    Otelia Limes, PTA 07/15/2017, 12:02 PM  [place], [place], [address] Phone: [phone]   Fax:  [phone]  Name: KADEE PHILYAW MRN: [id number] Date of Birth: 05-21-1980  PHYSICAL THERAPY DISCHARGE SUMMARY  Visits from Start of Care: 7  Current functional level related to goals / functional outcomes: unknown   Remaining deficits: unknown   Education / Equipment: Home exercise Plan: Patient agrees to discharge.  Patient goals were partially met. Patient is being discharged due to not returning since the last visit.  ?????    Maudry Diego, PT 10/23/17 2:30 PM

## 2017-07-22 DIAGNOSIS — M25562 Pain in left knee: Secondary | ICD-10-CM | POA: Diagnosis not present

## 2017-07-23 DIAGNOSIS — M25562 Pain in left knee: Secondary | ICD-10-CM | POA: Diagnosis not present

## 2017-07-29 DIAGNOSIS — M2242 Chondromalacia patellae, left knee: Secondary | ICD-10-CM | POA: Diagnosis not present

## 2017-07-29 DIAGNOSIS — M25662 Stiffness of left knee, not elsewhere classified: Secondary | ICD-10-CM | POA: Diagnosis not present

## 2017-07-29 DIAGNOSIS — Z01419 Encounter for gynecological examination (general) (routine) without abnormal findings: Secondary | ICD-10-CM | POA: Diagnosis not present

## 2017-07-29 DIAGNOSIS — M25562 Pain in left knee: Secondary | ICD-10-CM | POA: Diagnosis not present

## 2017-07-31 DIAGNOSIS — M2242 Chondromalacia patellae, left knee: Secondary | ICD-10-CM | POA: Diagnosis not present

## 2017-07-31 DIAGNOSIS — M25662 Stiffness of left knee, not elsewhere classified: Secondary | ICD-10-CM | POA: Diagnosis not present

## 2017-07-31 DIAGNOSIS — M25562 Pain in left knee: Secondary | ICD-10-CM | POA: Diagnosis not present

## 2017-08-05 DIAGNOSIS — M2242 Chondromalacia patellae, left knee: Secondary | ICD-10-CM | POA: Diagnosis not present

## 2017-08-05 DIAGNOSIS — M25662 Stiffness of left knee, not elsewhere classified: Secondary | ICD-10-CM | POA: Diagnosis not present

## 2017-08-05 DIAGNOSIS — M25562 Pain in left knee: Secondary | ICD-10-CM | POA: Diagnosis not present

## 2017-08-07 DIAGNOSIS — M25562 Pain in left knee: Secondary | ICD-10-CM | POA: Diagnosis not present

## 2017-08-07 DIAGNOSIS — M2242 Chondromalacia patellae, left knee: Secondary | ICD-10-CM | POA: Diagnosis not present

## 2017-08-07 DIAGNOSIS — M25662 Stiffness of left knee, not elsewhere classified: Secondary | ICD-10-CM | POA: Diagnosis not present

## 2017-08-12 DIAGNOSIS — M25662 Stiffness of left knee, not elsewhere classified: Secondary | ICD-10-CM | POA: Diagnosis not present

## 2017-08-12 DIAGNOSIS — M2242 Chondromalacia patellae, left knee: Secondary | ICD-10-CM | POA: Diagnosis not present

## 2017-08-12 DIAGNOSIS — M25562 Pain in left knee: Secondary | ICD-10-CM | POA: Diagnosis not present

## 2017-08-14 DIAGNOSIS — M25662 Stiffness of left knee, not elsewhere classified: Secondary | ICD-10-CM | POA: Diagnosis not present

## 2017-08-14 DIAGNOSIS — M25562 Pain in left knee: Secondary | ICD-10-CM | POA: Diagnosis not present

## 2017-08-14 DIAGNOSIS — M2242 Chondromalacia patellae, left knee: Secondary | ICD-10-CM | POA: Diagnosis not present

## 2017-08-19 DIAGNOSIS — M25662 Stiffness of left knee, not elsewhere classified: Secondary | ICD-10-CM | POA: Diagnosis not present

## 2017-08-19 DIAGNOSIS — M2242 Chondromalacia patellae, left knee: Secondary | ICD-10-CM | POA: Diagnosis not present

## 2017-08-19 DIAGNOSIS — M25562 Pain in left knee: Secondary | ICD-10-CM | POA: Diagnosis not present

## 2017-08-21 DIAGNOSIS — M25562 Pain in left knee: Secondary | ICD-10-CM | POA: Diagnosis not present

## 2017-08-21 DIAGNOSIS — M25662 Stiffness of left knee, not elsewhere classified: Secondary | ICD-10-CM | POA: Diagnosis not present

## 2017-08-21 DIAGNOSIS — M2242 Chondromalacia patellae, left knee: Secondary | ICD-10-CM | POA: Diagnosis not present

## 2017-08-26 DIAGNOSIS — M25662 Stiffness of left knee, not elsewhere classified: Secondary | ICD-10-CM | POA: Diagnosis not present

## 2017-08-26 DIAGNOSIS — M25562 Pain in left knee: Secondary | ICD-10-CM | POA: Diagnosis not present

## 2017-08-26 DIAGNOSIS — M2242 Chondromalacia patellae, left knee: Secondary | ICD-10-CM | POA: Diagnosis not present

## 2017-08-29 DIAGNOSIS — M25662 Stiffness of left knee, not elsewhere classified: Secondary | ICD-10-CM | POA: Diagnosis not present

## 2017-08-29 DIAGNOSIS — M25562 Pain in left knee: Secondary | ICD-10-CM | POA: Diagnosis not present

## 2017-08-29 DIAGNOSIS — M2242 Chondromalacia patellae, left knee: Secondary | ICD-10-CM | POA: Diagnosis not present

## 2017-09-02 DIAGNOSIS — M25662 Stiffness of left knee, not elsewhere classified: Secondary | ICD-10-CM | POA: Diagnosis not present

## 2017-09-02 DIAGNOSIS — M25562 Pain in left knee: Secondary | ICD-10-CM | POA: Diagnosis not present

## 2017-09-02 DIAGNOSIS — M2242 Chondromalacia patellae, left knee: Secondary | ICD-10-CM | POA: Diagnosis not present

## 2017-09-04 DIAGNOSIS — M25562 Pain in left knee: Secondary | ICD-10-CM | POA: Diagnosis not present

## 2017-09-04 DIAGNOSIS — M25662 Stiffness of left knee, not elsewhere classified: Secondary | ICD-10-CM | POA: Diagnosis not present

## 2017-09-04 DIAGNOSIS — M2242 Chondromalacia patellae, left knee: Secondary | ICD-10-CM | POA: Diagnosis not present

## 2017-09-11 DIAGNOSIS — M2242 Chondromalacia patellae, left knee: Secondary | ICD-10-CM | POA: Diagnosis not present

## 2017-09-11 DIAGNOSIS — M25562 Pain in left knee: Secondary | ICD-10-CM | POA: Diagnosis not present

## 2017-09-11 DIAGNOSIS — M25662 Stiffness of left knee, not elsewhere classified: Secondary | ICD-10-CM | POA: Diagnosis not present

## 2017-09-16 DIAGNOSIS — M2242 Chondromalacia patellae, left knee: Secondary | ICD-10-CM | POA: Diagnosis not present

## 2017-09-16 DIAGNOSIS — M25662 Stiffness of left knee, not elsewhere classified: Secondary | ICD-10-CM | POA: Diagnosis not present

## 2017-09-16 DIAGNOSIS — M25562 Pain in left knee: Secondary | ICD-10-CM | POA: Diagnosis not present

## 2017-09-19 DIAGNOSIS — M25662 Stiffness of left knee, not elsewhere classified: Secondary | ICD-10-CM | POA: Diagnosis not present

## 2017-09-19 DIAGNOSIS — M25562 Pain in left knee: Secondary | ICD-10-CM | POA: Diagnosis not present

## 2017-09-19 DIAGNOSIS — M2242 Chondromalacia patellae, left knee: Secondary | ICD-10-CM | POA: Diagnosis not present

## 2017-09-23 ENCOUNTER — Inpatient Hospital Stay: Payer: 59 | Attending: Hematology and Oncology | Admitting: Hematology and Oncology

## 2017-09-23 NOTE — Assessment & Plan Note (Deleted)
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 Adjuvant chemotherapy with TCHPX 6 cycles followed by Herceptin-Perjetamaintenance for one year started 02/22/2016- 01/02/2017 2. Followed by adjuvant radiation completed 08/13/2016 ----------------------------------------------------------------------------------------------------------------------------------------------- Chronic toxicities of chemotherapy 1. Severe fatigue: Discussed with her about different exercises that she can do to improve the fatigue. 2. Lack of sexual desire:Counseled   Rash on the face neck and chest: Resolved Breast Cancer Surveillance: 1. Breast exam 09/23/17: Normal 2. Mammogram to be done annually   RTC in 1 year

## 2017-10-21 ENCOUNTER — Ambulatory Visit (INDEPENDENT_AMBULATORY_CARE_PROVIDER_SITE_OTHER): Payer: 59 | Admitting: Internal Medicine

## 2017-10-21 ENCOUNTER — Ambulatory Visit (INDEPENDENT_AMBULATORY_CARE_PROVIDER_SITE_OTHER)
Admission: RE | Admit: 2017-10-21 | Discharge: 2017-10-21 | Disposition: A | Discharge status: Home / Self Care | Payer: 59 | Source: Ambulatory Visit | Attending: Internal Medicine | Admitting: Internal Medicine

## 2017-10-21 ENCOUNTER — Encounter: Payer: Self-pay | Admitting: Internal Medicine

## 2017-10-21 VITALS — BP 116/78 | HR 69 | Temp 98.1°F | Wt 230.0 lb

## 2017-10-21 DIAGNOSIS — M549 Dorsalgia, unspecified: Secondary | ICD-10-CM | POA: Diagnosis not present

## 2017-10-21 DIAGNOSIS — M79605 Pain in left leg: Secondary | ICD-10-CM

## 2017-10-21 DIAGNOSIS — M25512 Pain in left shoulder: Secondary | ICD-10-CM | POA: Diagnosis not present

## 2017-10-21 NOTE — Progress Notes (Signed)
Subjective:    Patient ID: Brianna James, female    DOB: 08/12/1980, 37 y.o.   MRN: [id number]  HPI  Pt presents to the clinic today with c/o left upper back pain. She reports this started 2-3 months ago, but worsened in the last 2-3 week. She describes the pain as discomfort with palpation. The pain does not radiate. She denies neck pain, left arm pain, numbness or tingling. The pain is not worse with movement or taking a deep breath. She denies any injury to the area. She has tried [employer], ice and Ibuprofen with minimal relief.  She also reports left lower leg pain. She reports this started a few weeks ago. She describes the pain as sharp. It is intermittent and only occurs when she puts pressure on her left leg, like walking up stairs. She denies numbness, tingling or weakness of the left lower extremity. She denies redness, swelling or warmth. She denies any injury to the area but reports she recently just finished PT for a left meniscal tear. She has tried Ibuprofen with minimal relief.  Review of Systems      Past Medical History:  Diagnosis Date  . Anemia    low iron  . Breast cancer ([place])   . Cancer ([place]) 01/2016   breast - left  . Chicken pox   . GERD (gastroesophageal reflux disease)    during pregnancy  . Hematoma    left breast  . History of radiation therapy 07/02/16- 08/13/16   Left Breast 50 Gy in 25 fractions, Left Breast Boost 10 Gy in 5 fractions, Left Axilla 45 Gy in 25 fractions.   Marland Kitchen HSV infection   . PONV (postoperative nausea and vomiting)     Current Outpatient Medications  Medication Sig Dispense Refill  . albuterol (PROVENTIL HFA;VENTOLIN HFA) 108 (90 Base) MCG/ACT inhaler INHALE 1-2 PUFFS INTO THE LUNGS EVERY 6 (SIX) HOURS AS NEEDED. 8.5 Inhaler 0  . fluticasone (FLONASE) 50 MCG/ACT nasal spray SPRAY 2 SPRAYS INTO EACH NOSTRIL EVERY DAY 16 g 0  . traMADol (ULTRAM) 50 MG tablet Take 1 tablet (50 mg total) by mouth every 12 (twelve) hours as  needed. 6 tablet 0   No current facility-administered medications for this visit.     Allergies  Allergen Reactions  . Other Swelling    Magic Mouthwash w/ lidocaine  . Oxycodone Nausea And Vomiting    Pt reports adverse reaction.   . Peanuts [Peanut Oil] Hives    Family History  Problem Relation Age of Onset  . Arthritis Mother   . Stroke Mother   . Stroke Maternal Aunt   . Stroke Maternal Uncle   . Breast cancer Maternal Grandmother        dx at 28 or younger; d. 36y  . Lung cancer Maternal Grandfather   . Stroke Paternal Grandmother   . Uterine cancer Paternal Grandmother        dx. 61-62  . Other Paternal Grandmother        colostomy bag in her 3s; unspecified reason  . Lung cancer Paternal Grandfather   . Uterine cancer Maternal Aunt        dx unspecified age  . Breast cancer Other        maternal great grandmother (MGM's mother) d. 60    Social History   Socioeconomic History  . Marital status: Married    Spouse name: Not on file  . Number of children: Not on file  .  Years of education: Not on file  . Highest education level: Not on file  Occupational History  . Not on file  Social Needs  . Financial resource strain: Not on file  . Food insecurity:    Worry: Not on file    Inability: Not on file  . Transportation needs:    Medical: Not on file    Non-medical: Not on file  Tobacco Use  . Smoking status: Never Smoker  . Smokeless tobacco: Never Used  Substance and Sexual Activity  . Alcohol use: Yes    Alcohol/week: 0.0 oz    Comment: rare - wine maybe 2x per yr  . Drug use: No  . Sexual activity: Yes    Birth control/protection: None  Lifestyle  . Physical activity:    Days per week: Not on file    Minutes per session: Not on file  . Stress: Not on file  Relationships  . Social connections:    Talks on phone: Not on file    Gets together: Not on file    Attends religious service: Not on file    Active member of club or organization: Not  on file    Attends meetings of clubs or organizations: Not on file    Relationship status: Not on file  . Intimate partner violence:    Fear of current or ex partner: Not on file    Emotionally abused: Not on file    Physically abused: Not on file    Forced sexual activity: Not on file  Other Topics Concern  . Not on file  Social History Narrative  . Not on file     Constitutional: Denies fever, malaise, fatigue, headache or abrupt weight changes.  Respiratory: Denies difficulty breathing, shortness of breath, cough or sputum production.   Cardiovascular: Denies chest pain, chest tightness, palpitations or swelling in the hands or feet.  Gastrointestinal: Denies abdominal pain, bloating, constipation, diarrhea or blood in the stool.  GU: Denies urgency, frequency, pain with urination, burning sensation, blood in urine, odor or discharge. Musculoskeletal: Pt reports left upper leg back pain, left leg pain. Denies decrease in range of motion, difficulty with gait, or joint pain and swelling.  Skin: Denies redness, rashes, lesions or ulcercations.   No other specific complaints in a complete review of systems (except as listed in HPI above).  Objective:   Physical Exam   BP 116/78   Pulse 69   Temp 98.1 F (36.7 C) (Oral)   Wt 230 lb (104.3 kg)   LMP 09/28/2017   SpO2 98%   BMI 36.02 kg/m  Wt Readings from Last 3 Encounters:  10/21/17 230 lb (104.3 kg)  07/09/17 231 lb (104.8 kg)  07/03/17 230 lb 9.6 oz (104.6 kg)    General: Appears her stated age, obese in NAD. Skin: Warm, dry and intact. No rashes noted. Cardiovascular: Normal rate and rhythm.  Pulmonary/Chest: Normal effort and positive vesicular breath sounds. No respiratory distress. No wheezes, rales or ronchi noted.  Musculoskeletal: Normal flexion, extension and rotation of the spine. No bony tenderness noted over the spine. Normal internal and external rotation of the left shoulder. Pain with palpation of the  subscapular region on the left. Normal flexion and extension of the knee. No joint swelling noted. No pain with palpation of the knee. Pt reports pain over the shin area, but not reproduced with palpation. Strength 5/5 BLE. No difficulty with gait. Neurological: Alert and oriented.    BMET  Component Value Date/Time   NA 135 02/27/2017 0925   NA 139 01/02/2017 0920   K 3.9 02/27/2017 0925   K 4.4 01/02/2017 0920   CL 104 02/27/2017 0925   CO2 22 02/27/2017 0925   CO2 27 01/02/2017 0920   GLUCOSE 88 02/27/2017 0925   GLUCOSE 94 01/02/2017 0920   BUN 5 (L) 02/27/2017 0925   BUN 8.7 01/02/2017 0920   CREATININE 0.97 02/27/2017 0925   CREATININE 1.0 01/02/2017 0920   CALCIUM 8.7 (L) 02/27/2017 0925   CALCIUM 9.0 01/02/2017 0920   GFRNONAA >60 02/27/2017 0925   GFRAA >60 02/27/2017 0925    Lipid Panel  No results found for: CHOL, TRIG, HDL, CHOLHDL, VLDL, LDLCALC  CBC    Component Value Date/Time   WBC 8.0 02/27/2017 0925   RBC 4.55 02/27/2017 0925   HGB 11.3 (L) 02/27/2017 0925   HGB 11.2 (L) 01/02/2017 0920   HCT 35.4 (L) 02/27/2017 0925   HCT 36.0 01/02/2017 0920   PLT 247 02/27/2017 0925   PLT 259 01/02/2017 0920   MCV 77.8 (L) 02/27/2017 0925   MCV 77.8 (L) 01/02/2017 0920   MCH 24.8 (L) 02/27/2017 0925   MCHC 31.9 02/27/2017 0925   RDW 15.7 (H) 02/27/2017 0925   RDW 18.1 (H) 01/02/2017 0920   LYMPHSABS 2.0 01/02/2017 0920   MONOABS 0.4 01/02/2017 0920   EOSABS 0.1 01/02/2017 0920   BASOSABS 0.0 01/02/2017 0920    Hgb A1C No results found for: HGBA1C         Assessment & Plan:   Left Upper Back Pain:  Seems muscular but she is very concerned because this is the same side of her previous breast cancer Xray of left scapula today Encouraged heat, massage and stretching Ok to continue Ibuprofen prn  Left Lower Leg Pain:  ? Shin splint but she reports she knows what that feels like and this feels different Low suspicion for DVT without calf pain,  swelling, redness or warmth Xray tib/fib today Encouraged ice, elevation and stretching  Will follow up after xrays, return precautions discussed Webb Silversmith, NP

## 2017-10-21 NOTE — Patient Instructions (Signed)

## 2017-11-04 ENCOUNTER — Telehealth: Payer: Self-pay | Admitting: Internal Medicine

## 2017-11-04 MED ORDER — METHOCARBAMOL 500 MG PO TABS: 500.0000 mg | ORAL_TABLET | Freq: Every evening | ORAL | 0 refills | Status: DC | PRN

## 2017-11-04 NOTE — Telephone Encounter (Signed)
Muscle relaxer sent to pharmacy

## 2017-11-04 NOTE — Telephone Encounter (Signed)
Copied from [place] [phone]. Topic: Quick Communication - See Telephone Encounter >> Nov 04, 2017 10:37 AM Synthia Innocent wrote: CRM for notification. See Telephone encounter for: 11/04/17. Patient checking status of Xray results and is requesting a muscle relaxer, ibuprofen is not working.

## 2017-11-04 NOTE — Telephone Encounter (Signed)
I have called this pt 2 times and left detailed message with lab results  Xray of left scapular region is normal. This is likely muscular. Heat, massage, NSAID's. I can give her a muscle relaxer if she would like.

## 2017-11-04 NOTE — Addendum Note (Signed)
Addended by: Jearld Fenton on: 11/04/2017 11:31 AM   Modules accepted: Orders

## 2017-11-20 ENCOUNTER — Other Ambulatory Visit: Payer: Self-pay | Admitting: Internal Medicine

## 2017-11-21 NOTE — Telephone Encounter (Signed)
Last filled 11/04/2017 #15... Please advise

## 2017-11-23 NOTE — Telephone Encounter (Signed)
Denied. This was for an acute issue. Refill not appropriate.

## 2018-01-27 DIAGNOSIS — C50412 Malignant neoplasm of upper-outer quadrant of left female breast: Secondary | ICD-10-CM | POA: Diagnosis not present

## 2018-01-27 DIAGNOSIS — R2231 Localized swelling, mass and lump, right upper limb: Secondary | ICD-10-CM | POA: Diagnosis not present

## 2018-02-17 ENCOUNTER — Encounter: Payer: Self-pay | Admitting: Hematology and Oncology

## 2018-02-17 DIAGNOSIS — R928 Other abnormal and inconclusive findings on diagnostic imaging of breast: Secondary | ICD-10-CM | POA: Diagnosis not present

## 2018-02-17 DIAGNOSIS — Z853 Personal history of malignant neoplasm of breast: Secondary | ICD-10-CM | POA: Diagnosis not present

## 2018-02-24 ENCOUNTER — Telehealth: Payer: Self-pay | Admitting: Oncology

## 2018-02-24 NOTE — Telephone Encounter (Signed)
[place] pt. Scheduled appt per 12/10 sch message - pt is aware of appt date and time

## 2018-03-25 NOTE — Progress Notes (Signed)
Patient Care Team: Jearld Fenton, NP as PCP - General (Internal Medicine) Servando Salina, MD as Consulting Physician (Obstetrics and Gynecology) Nicholas Lose, MD as Consulting Physician (Hematology and Oncology) Delice Bison, Charlestine Massed, NP as Nurse Practitioner (Hematology and Oncology) [name], MD as Consulting Physician (General Surgery) Eppie Gibson, MD as Attending Physician (Radiation Oncology)  DIAGNOSIS:    ICD-10-CM   1. Malignant neoplasm of upper-outer quadrant of left breast in female, estrogen receptor negative ([place]) C50.412    Z17.1     SUMMARY OF ONCOLOGIC HISTORY:   Malignant neoplasm of upper-outer quadrant of left breast in female, estrogen receptor negative ([place])   12/19/2015 Initial Diagnosis    Left breast biopsy: IDC with DCI S, grade 3, ER 0%, PR 0%, KI67 40%, her 2 positive ratio 6.15, copy number 16.5; palpable lump left breast 1.6 cm irregular oval mass, T1cN0 stage I a clinical stage    01/17/2016 Surgery    Left Lumpectomy: IDC 4.1 cm with DCIS, Margin 0.1 cm, 1/3 LN Positive, ER/PR Neg, Her 2 Pos with Ki 67 of 40%, T2N1 (stage 2B)    01/2016 Genetic Testing    Genetic tetsing was negative and found no deleterious mutations. Genes tested include: ATM, BARD1, BRCA1, BRCA2, BRIP1, CDH1, CHEK2, FANCC, MLH1, MSH2, MSH6, NBN, PALB2, PMS2, PTEN, RAD51C, RAD51D, TP53, and XRCC2.  This panel also includes deletion/duplication analysis (without sequencing) for one gene, EPCAM.      02/22/2016 -  Chemotherapy    Adjuvant chemotherapy with TCH Perjeta 6 cycles followed by Herceptin Perjeta maintenance     07/02/2016 - 08/13/2016 Radiation Therapy    Adjuvant radiation therapy     CHIEF COMPLIANT: Follow-up of left breast cancer  INTERVAL HISTORY: Brianna James is a 38 y.o. with above-mentioned history of left breast cancer treated with lumpectomy, adjuvant chemotherapy, and one year of Herceptin Perjeta maintenance. I last saw the patient one  year ago. Her most recent mammogram from 06/03/17 showed no evidence of malignancy. She presents to the clinic today alone and reports her energy levels have improved but she is still mildly fatigued. She reports occasional, sharp pain in her left lower leg. She felt a small lump in her left breast at her surgical site that a mammogram found to be fat necrosis and skin thickening.    REVIEW OF SYSTEMS:   Constitutional: Denies fevers, chills or abnormal weight loss (+) mild fatigue Eyes: Denies blurriness of vision Ears, nose, mouth, throat, and face: Denies mucositis or sore throat Respiratory: Denies cough, dyspnea or wheezes Cardiovascular: Denies palpitation, chest discomfort Gastrointestinal:  Denies nausea, heartburn or change in bowel habits Skin: Denies abnormal skin rashes MSK: (+) left lower leg pain Lymphatics: Denies new lymphadenopathy or easy bruising Neurological:Denies numbness, tingling or new weaknesses Behavioral/Psych: Mood is stable, no new changes  Extremities: No lower extremity edema Breast: denies any pain or nodules in either breasts  All other systems were reviewed with the patient and are negative.  I have reviewed the past medical history, past surgical history, social history and family history with the patient and they are unchanged from previous note.  ALLERGIES:  is allergic to other; oxycodone; and peanuts [peanut oil].  MEDICATIONS:  Current Outpatient Medications  Medication Sig Dispense Refill  . albuterol (PROVENTIL HFA;VENTOLIN HFA) 108 (90 Base) MCG/ACT inhaler INHALE 1-2 PUFFS INTO THE LUNGS EVERY 6 (SIX) HOURS AS NEEDED. 8.5 Inhaler 0  . fluticasone (FLONASE) 50 MCG/ACT nasal spray SPRAY 2 SPRAYS INTO EACH NOSTRIL  EVERY DAY 16 g 0  . methocarbamol (ROBAXIN) 500 MG tablet Take 1 tablet (500 mg total) by mouth at bedtime as needed for muscle spasms. 15 tablet 0  . traMADol (ULTRAM) 50 MG tablet Take 1 tablet (50 mg total) by mouth every 12 (twelve)  hours as needed. 6 tablet 0   No current facility-administered medications for this visit.     PHYSICAL EXAMINATION: ECOG PERFORMANCE STATUS: 1 - Symptomatic but completely ambulatory  Vitals:   03/31/18 0934  BP: 110/71  Pulse: 85  Resp: 17  Temp: 98.4 F (36.9 C)  SpO2: 98%   Filed Weights   03/31/18 0934  Weight: 226 lb (102.5 kg)    GENERAL:alert, no distress and comfortable SKIN: skin color, texture, turgor are normal, no rashes or significant lesions EYES: normal, Conjunctiva are pink and non-injected, sclera clear OROPHARYNX:no exudate, no erythema and lips, buccal mucosa, and tongue normal  NECK: supple, thyroid normal size, non-tender, without nodularity LYMPH:  no palpable lymphadenopathy in the cervical, axillary or inguinal LUNGS: clear to auscultation and percussion with normal breathing effort HEART: regular rate & rhythm and no murmurs and no lower extremity edema ABDOMEN:abdomen soft, non-tender and normal bowel sounds MUSCULOSKELETAL:no cyanosis of digits and no clubbing  NEURO: alert & oriented x 3 with fluent speech, no focal motor/sensory deficits EXTREMITIES: No lower extremity edema BREAST: No palpable masses or nodules in either right or left breasts. No palpable axillary supraclavicular or infraclavicular adenopathy no breast tenderness or nipple discharge. (exam performed in the presence of a chaperone)  LABORATORY DATA:  I have reviewed the data as listed CMP Latest Ref Rng & Units 02/27/2017 01/02/2017 11/21/2016  Glucose 65 - 99 mg/dL 88 94 90  BUN 6 - 20 mg/dL 5(L) 8.7 11.0  Creatinine 0.44 - 1.00 mg/dL 0.97 1.0 1.1  Sodium 135 - 145 mmol/L 135 139 137  Potassium 3.5 - 5.1 mmol/L 3.9 4.4 4.2  Chloride 101 - 111 mmol/L 104 - -  CO2 22 - 32 mmol/L '22 27 28  ' Calcium 8.9 - 10.3 mg/dL 8.7(L) 9.0 9.7  Total Protein 6.4 - 8.3 g/dL - 7.3 7.5  Total Bilirubin 0.20 - 1.20 mg/dL - 0.39 0.52  Alkaline Phos 40 - 150 U/L - 81 83  AST 5 - 34 U/L - 19 20    ALT 0 - 55 U/L - 7 7    Lab Results  Component Value Date   WBC 8.0 02/27/2017   HGB 11.3 (L) 02/27/2017   HCT 35.4 (L) 02/27/2017   MCV 77.8 (L) 02/27/2017   PLT 247 02/27/2017   NEUTROABS 4.1 01/02/2017    ASSESSMENT & PLAN:  Malignant neoplasm of upper-outer quadrant of left breast in female, estrogen receptor negative ([place]) 01/17/16: Left Lumpectomy: 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 Adjuvant chemotherapy with TCHPX 6 cycles followed by Herceptin-Perjetamaintenance for one year started 02/22/2016 completed 01/02/18 2. Followed by adjuvant radiation completed 08/13/2016 ---------------------------------------------------------------------------------------------------------------------------------------------------- Monitoring for chronic toxicities of chemotherapy 1. Severe fatigue:  2. Lack of sexual desire:Counseled  Breast Cancer Surveillance: 1. Mammograms 06/14/17: Benign but an MRI was suggested because of skin lymphadema On breast examination I do not feel any evidence of lymphedema or nodularity to warrant getting a breast MRI.  Her breast density is category B 2. Breast Exam: 03/31/2018: Benign Patient was complaining of her right axillary nodularity which was felt  to be benign by mammogram and ultrasound.  RTC in 1 year for surveillance and follow up.  Patient may have moved to [place] and could transfer her care to breast clinic in [place].  We will send records as she requests.    No orders of the defined types were placed in this encounter.  The patient has a good understanding of the overall plan. she agrees with it. she will call with any problems that may develop before the next visit here.  Nicholas Lose, MD 03/31/2018  Julious Oka Dorshimer am acting as scribe for Dr. Nicholas Lose.  I have reviewed the above documentation for accuracy and completeness,  and I agree with the above.

## 2018-03-31 ENCOUNTER — Inpatient Hospital Stay: Payer: 59 | Attending: Hematology and Oncology | Admitting: Hematology and Oncology

## 2018-03-31 DIAGNOSIS — Z923 Personal history of irradiation: Secondary | ICD-10-CM | POA: Insufficient documentation

## 2018-03-31 DIAGNOSIS — R5383 Other fatigue: Secondary | ICD-10-CM | POA: Insufficient documentation

## 2018-03-31 DIAGNOSIS — C50412 Malignant neoplasm of upper-outer quadrant of left female breast: Secondary | ICD-10-CM

## 2018-03-31 DIAGNOSIS — Z171 Estrogen receptor negative status [ER-]: Secondary | ICD-10-CM

## 2018-03-31 NOTE — Assessment & Plan Note (Addendum)
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 Adjuvant chemotherapy with TCHPX 6 cycles followed by Herceptin-Perjetamaintenance for one year started 02/22/2016 completed 01/02/18 2. Followed by adjuvant radiation completed 08/13/2016 ---------------------------------------------------------------------------------------------------------------------------------------------------- Monitoring for chronic toxicities of chemotherapy 1. Severe fatigue:  2. Lack of sexual desire:Counseled  Breast Cancer Surveillance: 1. Mammograms 06/14/17: Benign but an MRI was suggested because of skin lymphadema We will get a Breast MRI for surveillance and evaluation of breast changes. 2. Breast Exam: 03/31/2018: Benign Patient was complaining of her right axillary nodularity which was felt to be benign by mammogram and ultrasound.  RTC in 1 year for surveillance and follow up

## 2018-06-03 ENCOUNTER — Telehealth: Payer: 59 | Admitting: Family

## 2018-06-03 DIAGNOSIS — J069 Acute upper respiratory infection, unspecified: Secondary | ICD-10-CM | POA: Diagnosis not present

## 2018-06-03 DIAGNOSIS — J301 Allergic rhinitis due to pollen: Secondary | ICD-10-CM

## 2018-06-03 MED ORDER — FLUTICASONE PROPIONATE 50 MCG/ACT NA SUSP: 2.0000 | Freq: Every day | NASAL | 6 refills | Status: AC

## 2018-06-03 NOTE — Progress Notes (Signed)
We are sorry you are not feeling well.  Here is how we plan to help!  Based on what you have shared with me, it looks like you may have a viral upper respiratory infection.  Upper respiratory infections are caused by a large number of viruses; however, rhinovirus is the most common cause.   Symptoms vary from person to person, with common symptoms including sore throat, cough, and fatigue or lack of energy.  A low-grade fever of up to 100.4 may present, but is often uncommon.  Symptoms vary however, and are closely related to a person's age or underlying illnesses.  The most common symptoms associated with an upper respiratory infection are nasal discharge or congestion, cough, sneezing, headache and pressure in the ears and face.  These symptoms usually persist for about 3 to 10 days, but can last up to 2 weeks.  It is important to know that upper respiratory infections do not cause serious illness or complications in most cases.    Upper respiratory infections can be transmitted from person to person, with the most common method of transmission being a person's hands.  The virus is able to live on the skin and can infect other persons for up to 2 hours after direct contact.  Also, these can be transmitted when someone coughs or sneezes; thus, it is important to cover the mouth to reduce this risk.  To keep the spread of the illness at [place], good hand hygiene is very important.  This is an infection that is most likely caused by a virus. There are no specific treatments other than to help you with the symptoms until the infection runs its course.  We are sorry you are not feeling well.  Here is how we plan to help!   For nasal congestion, you may use an oral decongestants such as Mucinex D or if you have glaucoma or high blood pressure use plain Mucinex.  Saline nasal spray or nasal drops can help and can safely be used as often as needed for congestion.  For your congestion, I have prescribed Fluticasone  nasal spray one spray in each nostril twice a day  If you do not have a history of heart disease, hypertension, diabetes or thyroid disease, prostate/bladder issues or glaucoma, you may also use Sudafed to treat nasal congestion.  It is highly recommended that you consult with a pharmacist or your primary care physician to ensure this medication is safe for you to take.     If you have a cough, you may use cough suppressants such as Delsym and Robitussin.  If you have glaucoma or high blood pressure, you can also use Coricidin HBP.    If you have a sore or scratchy throat, use a saltwater gargle-  to  teaspoon of salt dissolved in a 4-ounce to 8-ounce glass of warm water.  Gargle the solution for approximately 15-30 seconds and then spit.  It is important not to swallow the solution.  You can also use throat lozenges/cough drops and Chloraseptic spray to help with throat pain or discomfort.  Warm or cold liquids can also be helpful in relieving throat pain.  For headache, pain or general discomfort, you can use Ibuprofen or Tylenol as directed.   Some authorities believe that zinc sprays or the use of Echinacea may shorten the course of your symptoms.  Approximately 5 minutes was spent documenting and reviewing patient's chart.    HOME CARE . Only take medications as instructed by your  medical team. . Be sure to drink plenty of fluids. Water is fine as well as fruit juices, sodas and electrolyte beverages. You may want to stay away from caffeine or alcohol. If you are nauseated, try taking small sips of liquids. How do you know if you are getting enough fluid? Your urine should be a pale yellow or almost colorless. . Get rest. . Taking a steamy shower or using a humidifier may help nasal congestion and ease sore throat pain. You can place a towel over your head and breathe in the steam from hot water coming from a faucet. . Using a saline nasal spray works much the same way. . Cough drops, hard  candies and sore throat lozenges may ease your cough. . Avoid close contacts especially the very young and the elderly . Cover your mouth if you cough or sneeze . Always remember to wash your hands.   GET HELP RIGHT AWAY IF: . You develop worsening fever. . If your symptoms do not improve within 10 days . You develop yellow or green discharge from your nose over 3 days. . You have coughing fits . You develop a severe head ache or visual changes. . You develop shortness of breath, difficulty breathing or start having chest pain . Your symptoms persist after you have completed your treatment plan  MAKE SURE YOU   Understand these instructions.  Will watch your condition.  Will get help right away if you are not doing well or get worse.  Your e-visit answers were reviewed by a board certified advanced clinical practitioner to complete your personal care plan. Depending upon the condition, your plan could have included both over the counter or prescription medications. Please review your pharmacy choice. If there is a problem, you may call our nursing hot line at and have the prescription routed to another pharmacy. Your safety is important to [place]. If you have drug allergies check your prescription carefully.   You can use MyChart to ask questions about today's visit, request a non-urgent call back, or ask for a work or school excuse for 24 hours related to this e-Visit. If it has been greater than 24 hours you will need to follow up with your provider, or enter a new e-Visit to address those concerns. You will get an e-mail in the next two days asking about your experience.  I hope that your e-visit has been valuable and will speed your recovery. Thank you for using e-visits.

## 2018-06-12 ENCOUNTER — Other Ambulatory Visit: Payer: Self-pay

## 2018-06-12 ENCOUNTER — Ambulatory Visit (INDEPENDENT_AMBULATORY_CARE_PROVIDER_SITE_OTHER): Payer: 59 | Admitting: Family Medicine

## 2018-06-12 ENCOUNTER — Encounter: Payer: Self-pay | Admitting: Family Medicine

## 2018-06-12 ENCOUNTER — Telehealth: Payer: Self-pay

## 2018-06-12 DIAGNOSIS — H1013 Acute atopic conjunctivitis, bilateral: Secondary | ICD-10-CM

## 2018-06-12 NOTE — Progress Notes (Signed)
Virtual Visit via Video Note  I connected with Brianna James on 06/12/18 at  2:30 PM EDT by a video enabled telemedicine application and verified that I am speaking with the correct person using two identifiers. We attempted to connect via WebEx, but patient was unable to connect. She was at her home. I was in my office.    I discussed the limitations of evaluation and management by telemedicine and the availability of in person appointments. The patient expressed understanding and agreed to proceed.  History of Present Illness: This is a 38 yo female On March 6, she went to [place], returned a couple of days later. She got sunburned. On March 9 she noticed puffy, red eyes. She used some leftover eye drops for two days, improvement of symptoms. Four days later, noticed eyes were red and was having allergy symptoms. She did evisit 06/03/18 and was started on fluticasone. She has been using Visine for allergy. Eyes are itchy. No fever. No cough, no SOB. No nasal drainage. Eyes are draining white/cream colored, pale yellow just in the morning. No feeling of foreign body. Occasionally blurry eyes in am, no pain.    Observations/Objective: Patient was alert and oriented. She was able to speak in complete sentences. She is going to upload pictures to her Mychart for me to view  Assessment and Plan: 1. Allergic conjunctivitis of both eyes - reviewed diagnosis with patient and sent her information and instructions via mychart - follow up precautions reviewed  - if worsening or increased daytime drainage, will treat with antibiotic   Clarene Reamer, FNP-BC  [facility], [place]  06/12/2018 2:57 PM   Follow Up Instructions:    I discussed the assessment and treatment plan with the patient. The patient was provided an opportunity to ask questions and all were answered. The patient agreed with the plan and demonstrated an understanding of the  instructions.   The patient was advised to call back or seek an in-person evaluation if the symptoms worsen or if the condition fails to improve as anticipated.  I provided 17 minutes of non-face-to-face time during this encounter.   Elby Beck, FNP

## 2018-06-12 NOTE — Telephone Encounter (Signed)
Noted  

## 2018-06-12 NOTE — Telephone Encounter (Signed)
Received IBC from patient stating she is having itching and discharge in both eyes. Using Visine Allergy but has not been effective. Onset: approx. 1.5 wks ago.  Patient is able to complete Webex. Appt scheduled 06/12/18 @ 230 with Tor Netters, NP.

## 2018-07-06 ENCOUNTER — Other Ambulatory Visit: Payer: Self-pay

## 2018-07-06 ENCOUNTER — Ambulatory Visit (INDEPENDENT_AMBULATORY_CARE_PROVIDER_SITE_OTHER): Payer: 59 | Admitting: Internal Medicine

## 2018-07-06 ENCOUNTER — Encounter: Payer: Self-pay | Admitting: Internal Medicine

## 2018-07-06 ENCOUNTER — Ambulatory Visit: Payer: Self-pay

## 2018-07-06 VITALS — Wt 222.0 lb

## 2018-07-06 DIAGNOSIS — M79602 Pain in left arm: Secondary | ICD-10-CM

## 2018-07-06 DIAGNOSIS — R0602 Shortness of breath: Secondary | ICD-10-CM | POA: Diagnosis not present

## 2018-07-06 DIAGNOSIS — J069 Acute upper respiratory infection, unspecified: Secondary | ICD-10-CM

## 2018-07-06 DIAGNOSIS — B9789 Other viral agents as the cause of diseases classified elsewhere: Secondary | ICD-10-CM

## 2018-07-06 MED ORDER — ALBUTEROL SULFATE HFA 108 (90 BASE) MCG/ACT IN AERS: 2.0000 | INHALATION_SPRAY | Freq: Four times a day (QID) | RESPIRATORY_TRACT | 0 refills | Status: AC | PRN

## 2018-07-06 NOTE — Telephone Encounter (Signed)
Pt with a h/o breast cancer, c/o SOB at rest and lightheadedness. Pt c/o sporadic dry cough. Denies fever or other symptoms.  Pt stated she began having symptoms this morning. Pt did not sound SOB and was talking in full sentences. Pt concerned she could be having symptoms of Covid-19. Care advice given and pt verbalized understanding. No answer at practice to schedule pt. Pt given a 2 pm appt today with PCP. Pt's e mail verified and is correct. [employer] with this information.        Reason for Disposition . HIGH RISK patient (e.g., age > 37 years, diabetes, heart or lung disease, weak immune system)  Answer Assessment - Initial Assessment Questions 1. COVID-19 DIAGNOSIS: "Who made your Coronavirus (COVID-19) diagnosis?" "Was it confirmed by a positive lab test?" If not diagnosed by a HCP, ask "Are there lots of cases (community spread) where you live?" (See public health department website, if unsure)   * MAJOR community spread: high number of cases; numbers of cases are increasing; many people hospitalized.   * MINOR community spread: low number of cases; not increasing; few or no people hospitalized     major 2. ONSET: "When did the COVID-19 symptoms start?"     This morning 3. WORST SYMPTOM: "What is your worst symptom?" (e.g., cough, fever, shortness of breath, muscle aches)   SOB 4. COUGH: "How bad is the cough?"       Dry cough sporadic 5. FEVER: "Do you have a fever?" If so, ask: "What is your temperature, how was it measured, and when did it start?"     no 6. RESPIRATORY STATUS: "Describe your breathing?" (e.g., shortness of breath, wheezing, unable to speak)     SOB at rest  7. BETTER-SAME-WORSE: "Are you getting better, staying the same or getting worse compared to yesterday?"  If getting worse, ask, "In what way?"    Feeling worse  8. HIGH RISK DISEASE: "Do you have any chronic medical problems?" (e.g., asthma, heart or lung disease, weak immune system, etc.)    Breast  cancer remission x 2 years 9. PREGNANCY: "Is there any chance you are pregnant?" "When was your last menstrual period?"    No LMP: April 14 10. OTHER SYMPTOMS: "Do you have any other symptoms?"  (e.g., runny nose, headache, sore throat, loss of smell)      Dizziness- lightheadedness headache- red eyes  Protocols used: CORONAVIRUS (COVID-19) DIAGNOSED OR SUSPECTED-A-AH

## 2018-07-06 NOTE — Patient Instructions (Signed)
Shortness of Breath, Adult  Shortness of breath means you have trouble breathing. Shortness of breath could be a sign of a medical problem.  Follow these instructions at home:     Watch for any changes in your symptoms.   Do not use any products that contain nicotine or tobacco, such as cigarettes, e-cigarettes, and chewing tobacco.   Do not smoke. Smoking can cause shortness of breath. If you need help to quit smoking, ask your doctor.   Avoid things that can make it harder to breathe, such as:  ? Mold.  ? Dust.  ? Air pollution.  ? Chemical smells.  ? Things that can cause allergy symptoms (allergens), if you have allergies.   Keep your living space clean. Use products that help remove mold and dust.   Rest as needed. Slowly return to your normal activities.   Take over-the-counter and prescription medicines only as told by your doctor. This includes oxygen therapy and inhaled medicines.   Keep all follow-up visits as told by your doctor. This is important.  Contact a doctor if:   Your condition does not get better as soon as expected.   You have a hard time doing your normal activities, even after you rest.   You have new symptoms.  Get help right away if:   Your shortness of breath gets worse.   You have trouble breathing when you are resting.   You feel light-headed or you pass out (faint).   You have a cough that is not helped by medicines.   You cough up blood.   You have pain with breathing.   You have pain in your chest, arms, shoulders, or belly (abdomen).   You have a fever.   You cannot walk up stairs.   You cannot exercise the way you normally do.  These symptoms may represent a serious problem that is an emergency. Do not wait to see if the symptoms will go away. Get medical help right away. Call your local emergency services (911 in the [place] Do not drive yourself to the hospital.  Summary   Shortness of breath is when you have trouble breathing enough air. It can be a sign of a  medical problem.   Avoid things that make it hard for you to breathe, such as smoking, pollution, mold, and dust.   Watch for any changes in your symptoms. Contact your doctor if you do not get better or you get worse.  This information is not intended to replace advice given to you by your health care provider. Make sure you discuss any questions you have with your health care provider.  Document Released: 08/21/2007 Document Revised: 08/04/2017 Document Reviewed: 08/04/2017  [employer]vier Interactive Patient Education  2019 [employer]

## 2018-07-06 NOTE — Progress Notes (Signed)
Virtual Visit via Video Note  I connected with Brianna James on 07/06/18 at  2:00 PM EDT by a video enabled telemedicine application and verified that I am speaking with the correct person using two identifiers.   I discussed the limitations of evaluation and management by telemedicine and the availability of in person appointments. The patient expressed understanding and agreed to proceed.  Patient Location: Home Provider Location: Office  Video connected but audio failed on patients end. We continued video but connected via telephone for audio.  History of Present Illness:  Pt reports headache, neck pain, dizziness, cough and shortness of breath. This started 1 week ago but worsened yesterday. The headache is located in her temples. She describes the pain as pressure. She describes the dizziness as a sense of imbalance not that the room is spinning. The cough is nonproductive. The shortness of breath is constant, worse with sitting up and exertion. She denies runny nose, nasal congestion, ear pain, or sore throat. She denies fever, chills or body aches. She has had some left arm pain with associated numbness. She denies any injury to the area. She has tried an antihistamine OTC and Flonase with minimal relief. She has not had sick contacts that she is aware of.   Observations/Objective:  Alert and oriented x 3 day NAD No apparent cough or SOB, no difficulty breathing Normal internal and external rotation of the left shoulder She points to the left upper arm as the source of her pain Behavior, judgement and thought content are normal.  Assessment and Plan:  Viral URI with Cough, SOB:  Continue Claritin and Flonase RX for Albuterol inhaler every 4-6 hours as needed for shortness of breath Discussed possibility of COVID 19, that she should self quarantine x 7 days If symptoms worse, will reeval later in the week,   Left Arm Pain:  Ibuprofen 400 mg every 8 hours as needed for  pain Try a heating pad If no improvement, make in office visit for further evaluation  Follow Up Instructions:    I discussed the assessment and treatment plan with the patient. The patient was provided an opportunity to ask questions and all were answered. The patient agreed with the plan and demonstrated an understanding of the instructions.   The patient was advised to call back or seek an in-person evaluation if the symptoms worsen or if the condition fails to improve as anticipated.     Webb Silversmith, NP

## 2018-07-22 IMAGING — CR DG CHEST 1V PORT
1 series · 1 of 1 positions shown · non-contrast
Comparison: None.

CLINICAL DATA: Status post port placement

EXAM:
PORTABLE CHEST 1 VIEW

[AP]
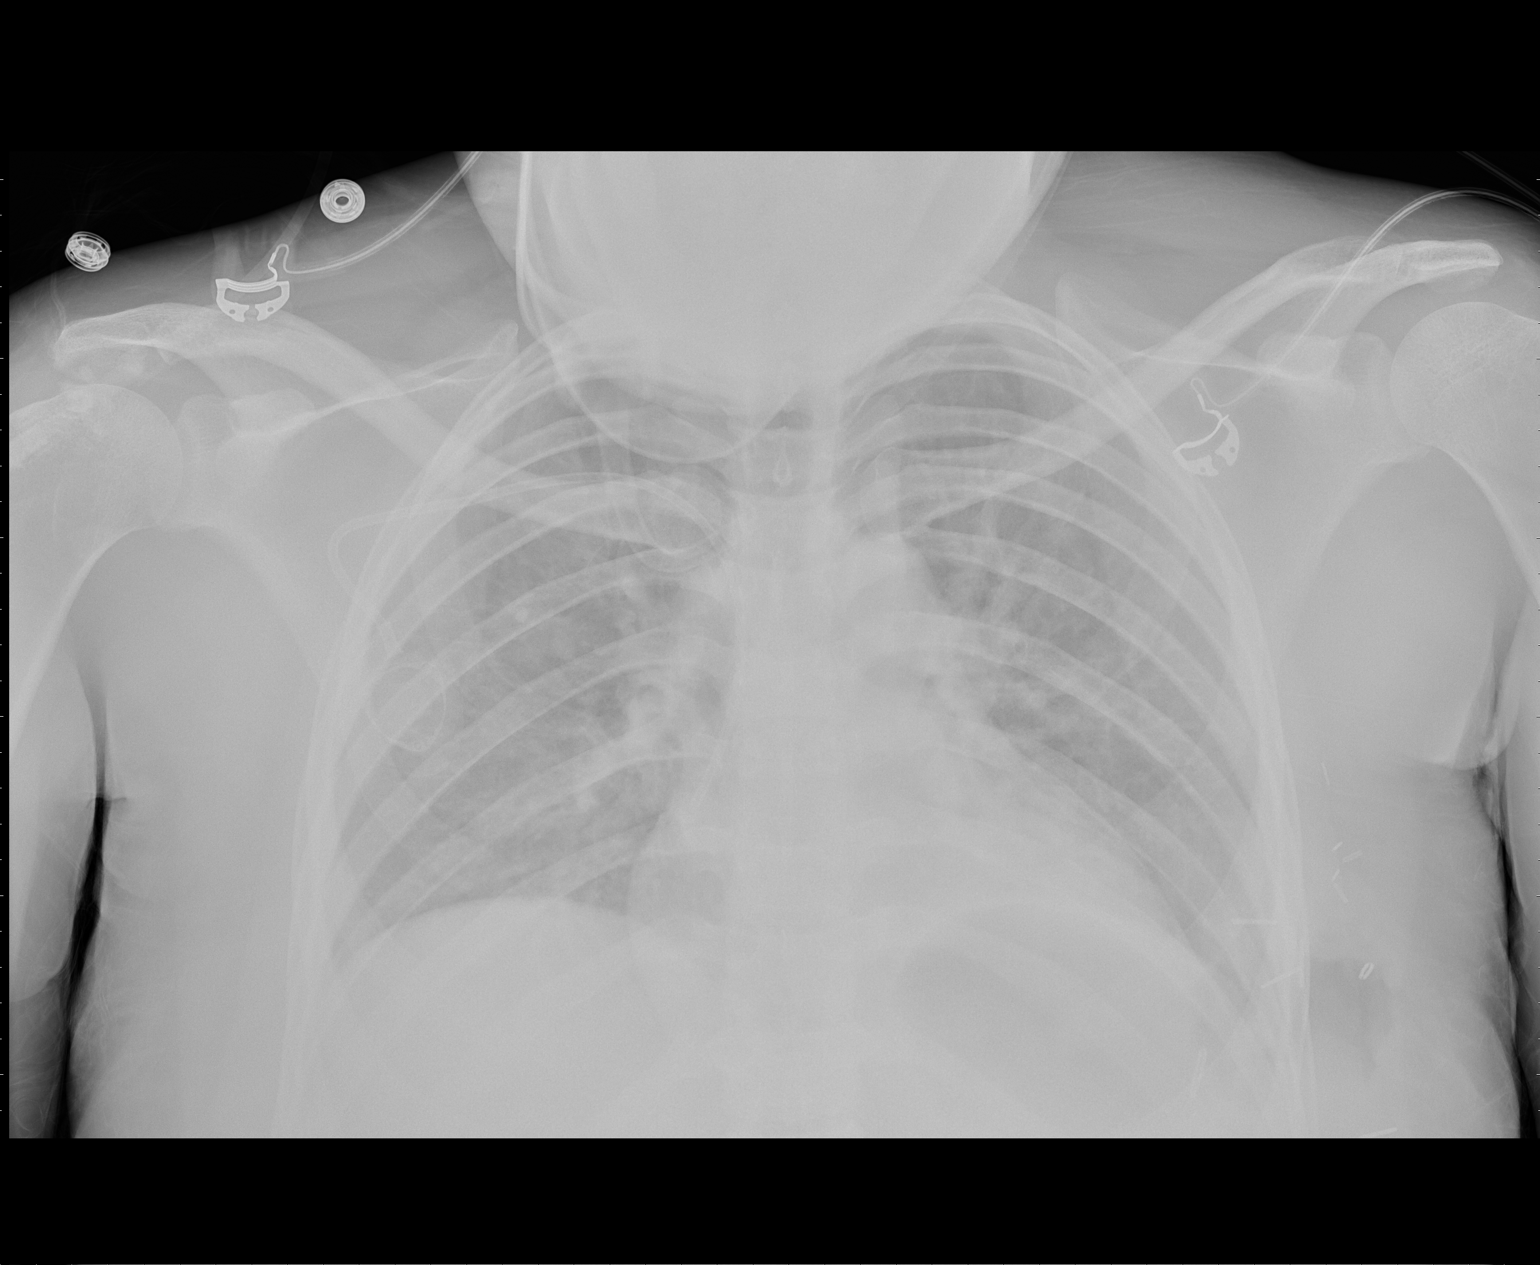

[1 of 1 positions shown; findings below may reference images not displayed]

FINDINGS: A right chest wall port is noted with the catheter tip at the
cavoatrial junction. Cardiac shadow is within normal limits. The
overall inspiratory effort is poor with crowding of the vascular
markings. Postsurgical changes in the left breast are noted. No bony
abnormality is seen. No pneumothorax is noted.
IMPRESSION: Status post port placement without pneumothorax.

Poor inspiratory effort with crowding of the vascular markings.

## 2018-11-24 IMAGING — DX DG ABDOMEN 2V
2 series · 2 of 2 positions shown · non-contrast
Comparison: None.

CLINICAL DATA: Mid abdominal pain for 2 days, initial encounter

EXAM:
ABDOMEN - 2 VIEW

[abdomen erect]
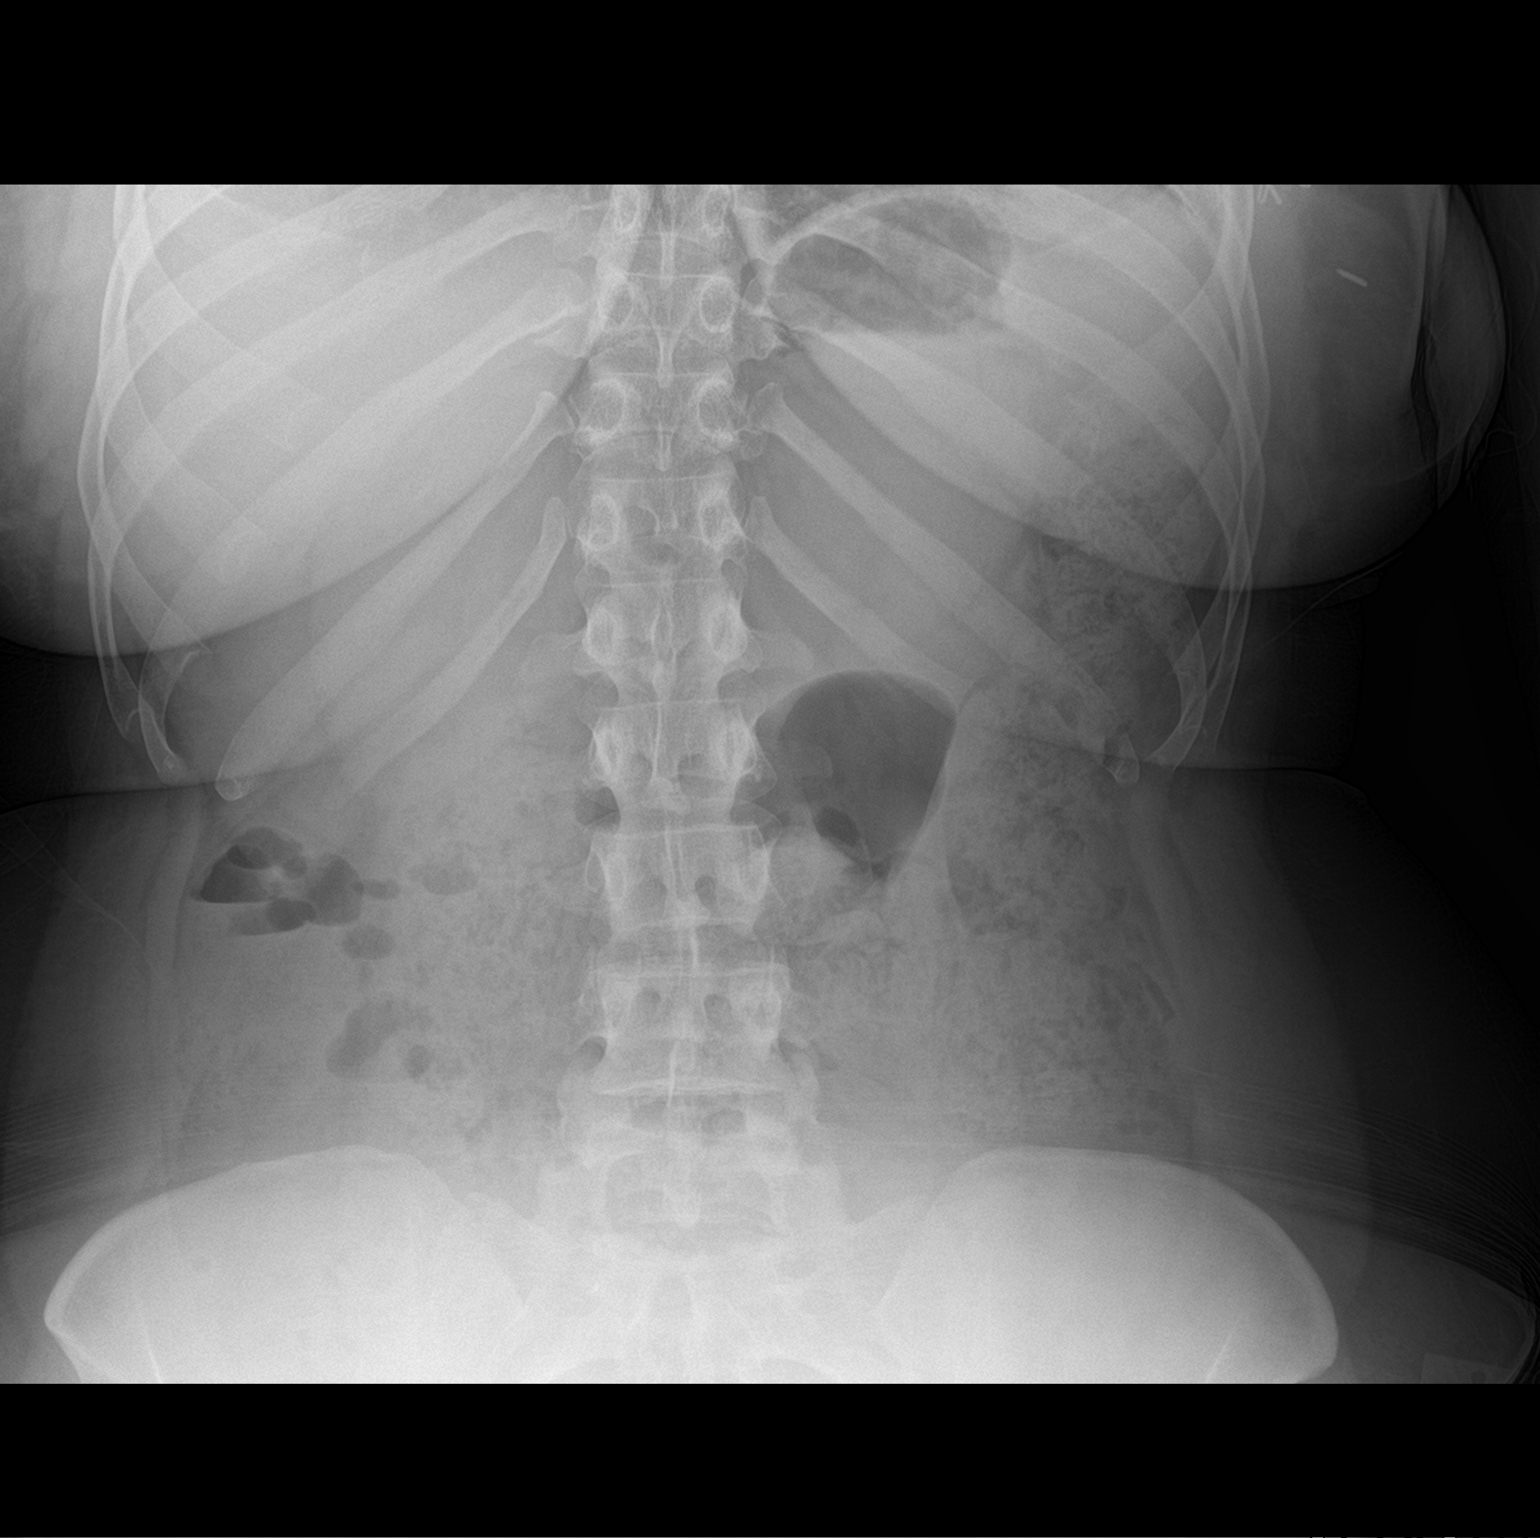

[abdomen supine]
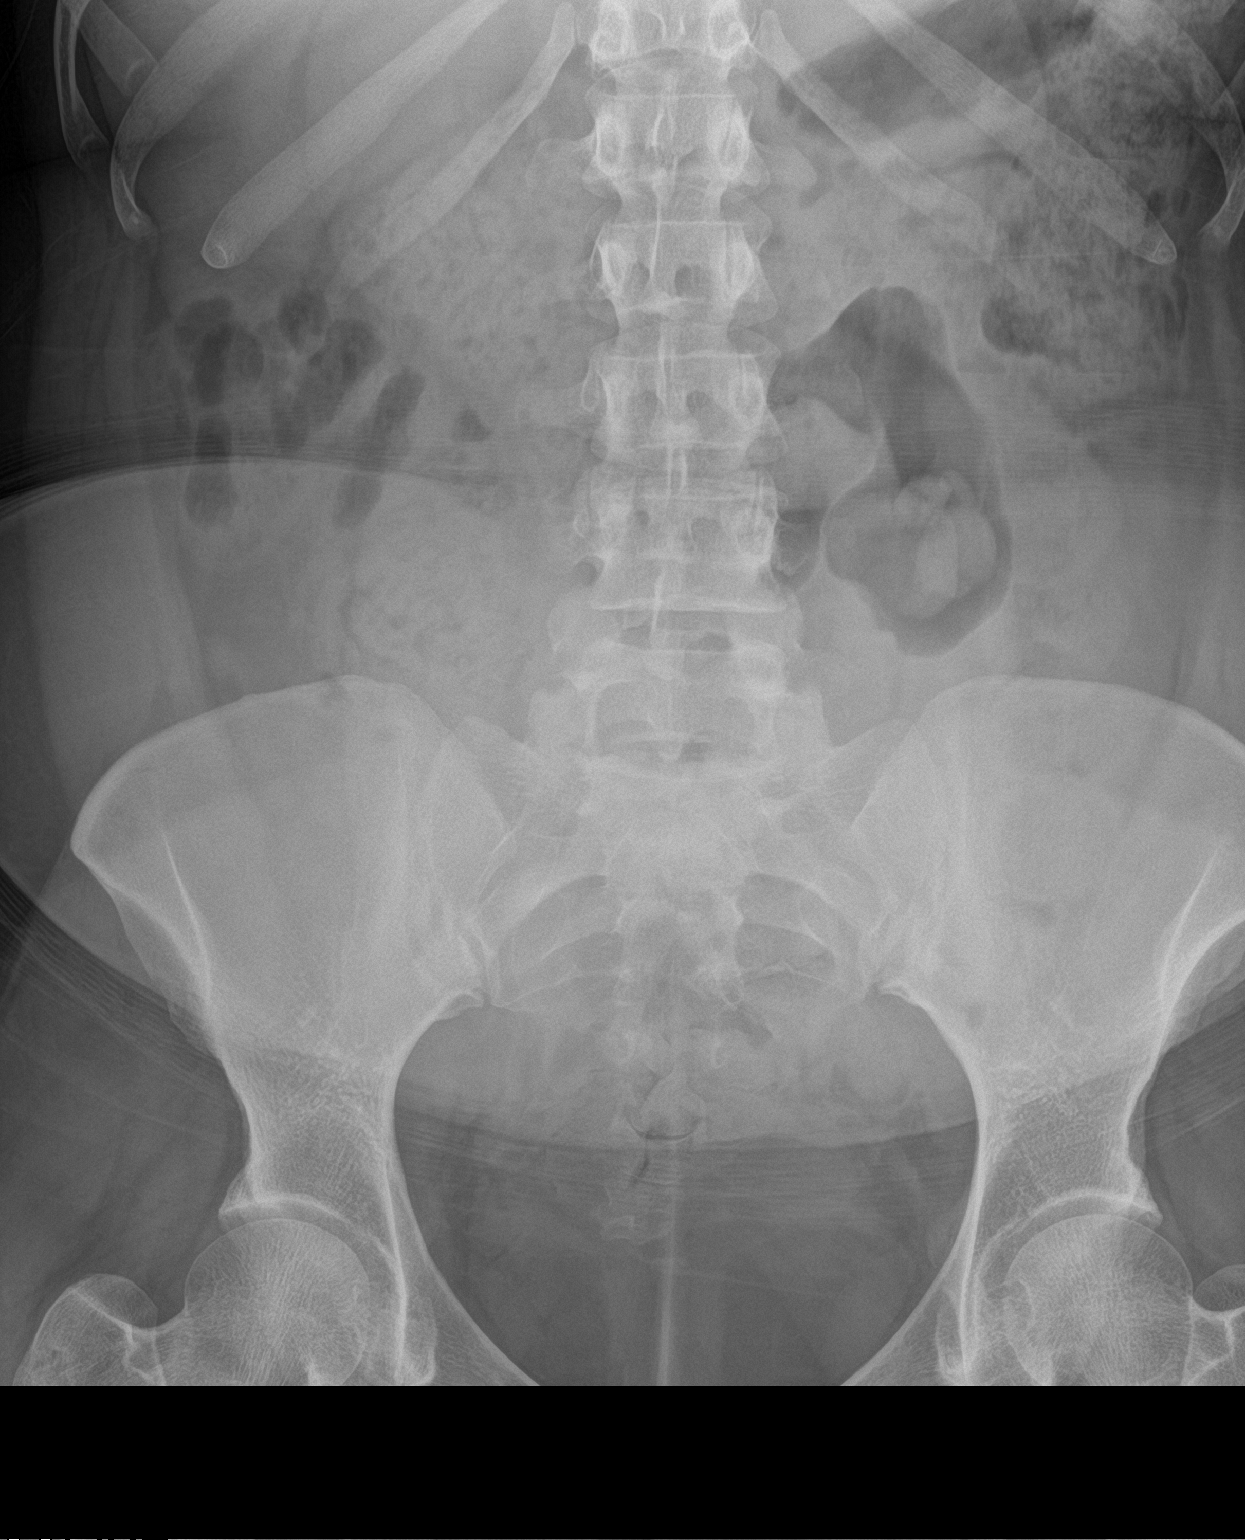

[2 of 2 positions shown; findings below may reference images not displayed]

FINDINGS: Scattered large and small bowel gas is noted. Fecal material is
noted throughout the colon consistent with constipation. No
obstructive changes are seen. No free air is noted. No bony
abnormality is seen. Postsurgical changes are noted in the left
breast consistent with the given clinical history.
IMPRESSION: Changes of constipation.

## 2019-04-01 NOTE — Progress Notes (Signed)
 Patient Care Team: Baity, Regina W, NP as PCP - General (Internal Medicine) Cousins, Sheronette, MD as Consulting Physician (Obstetrics and Gynecology) Gudena, Vinay, MD as Consulting Physician (Hematology and Oncology) Causey, Lindsey Cornetto, NP as Nurse Practitioner (Hematology and Oncology) Byerly, Faera, MD as Consulting Physician (General Surgery) Squire, Sarah, MD as Attending Physician (Radiation Oncology)  DIAGNOSIS:    ICD-10-CM   1. Malignant neoplasm of upper-outer quadrant of left breast in female, estrogen receptor negative (HCC)  C50.412    Z17.1     SUMMARY OF ONCOLOGIC HISTORY: Oncology History  Malignant neoplasm of upper-outer quadrant of left breast in female, estrogen receptor negative (HCC)  12/19/2015 Initial Diagnosis   Left breast biopsy: IDC with DCI S, grade 3, ER 0%, PR 0%, KI67 40%, her 2 positive ratio 6.15, copy number 16.5; palpable lump left breast 1.6 cm irregular oval mass, T1cN0 stage I a clinical stage   01/17/2016 Surgery   Left Lumpectomy: IDC 4.1 cm with DCIS, Margin 0.1 cm, 1/3 LN Positive, ER/PR Neg, Her 2 Pos with Ki 67 of 40%, T2N1 (stage 2B)   01/2016 Genetic Testing   Genetic tetsing was negative and found no deleterious mutations. Genes tested include: ATM, BARD1, BRCA1, BRCA2, BRIP1, CDH1, CHEK2, FANCC, MLH1, MSH2, MSH6, NBN, PALB2, PMS2, PTEN, RAD51C, RAD51D, TP53, and XRCC2.  This panel also includes deletion/duplication analysis (without sequencing) for one gene, EPCAM.     02/22/2016 -  Chemotherapy   Adjuvant chemotherapy with TCH Perjeta 6 cycles followed by Herceptin Perjeta maintenance    07/02/2016 - 08/13/2016 Radiation Therapy   Adjuvant radiation therapy     CHIEF COMPLIANT: Follow-up of left breast cancer  INTERVAL HISTORY: Brianna James is a 39 y.o. with above-mentioned history of left breast cancer treated with lumpectomy, adjuvant chemotherapy, Herceptin Perjeta maintenance, and who is currently on  surveillance. She presents to the clinic today for follow-up.   ALLERGIES:  is allergic to other; oxycodone; and peanuts [peanut oil].  MEDICATIONS:  Current Outpatient Medications  Medication Sig Dispense Refill  . albuterol (VENTOLIN HFA) 108 (90 Base) MCG/ACT inhaler Inhale 2 puffs into the lungs every 6 (six) hours as needed for wheezing or shortness of breath. 1 Inhaler 0  . fluticasone (FLONASE) 50 MCG/ACT nasal spray Place 2 sprays into both nostrils daily. 16 g 6  . loratadine (CLARITIN) 10 MG tablet Take 10 mg by mouth daily.     No current facility-administered medications for this visit.    PHYSICAL EXAMINATION: ECOG PERFORMANCE STATUS: 1 - Symptomatic but completely ambulatory  There were no vitals filed for this visit. There were no vitals filed for this visit.  BREAST: No palpable masses or nodules in either right or left breasts. No palpable axillary supraclavicular or infraclavicular adenopathy no breast tenderness or nipple discharge. (exam performed in the presence of a chaperone)  LABORATORY DATA:  I have reviewed the data as listed CMP Latest Ref Rng & Units 02/27/2017 01/02/2017 11/21/2016  Glucose 65 - 99 mg/dL 88 94 90  BUN 6 - 20 mg/dL 5(L) 8.7 11.0  Creatinine 0.44 - 1.00 mg/dL 0.97 1.0 1.1  Sodium 135 - 145 mmol/L 135 139 137  Potassium 3.5 - 5.1 mmol/L 3.9 4.4 4.2  Chloride 101 - 111 mmol/L 104 - -  CO2 22 - 32 mmol/L 22 27 28  Calcium 8.9 - 10.3 mg/dL 8.7(L) 9.0 9.7  Total Protein 6.4 - 8.3 g/dL - 7.3 7.5  Total Bilirubin 0.20 - 1.20 mg/dL - 0.39 0.52    Alkaline Phos 40 - 150 U/L - 81 83  AST 5 - 34 U/L - 19 20  ALT 0 - 55 U/L - 7 7    Lab Results  Component Value Date   WBC 8.0 02/27/2017   HGB 11.3 (L) 02/27/2017   HCT 35.4 (L) 02/27/2017   MCV 77.8 (L) 02/27/2017   PLT 247 02/27/2017   NEUTROABS 4.1 01/02/2017    ASSESSMENT & PLAN:  Malignant neoplasm of upper-outer quadrant of left breast in female, estrogen receptor negative  (HCC) 01/17/16: Left Lumpectomy: 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 Adjuvant chemotherapy with TCHPX 6 cycles followed by Herceptin-Perjetamaintenance for one year started 02/22/2016 completed 01/02/18 2. Followed by adjuvant radiation completed 08/13/2016 ---------------------------------------------------------------------------------------------------------------------------------------------------- Monitoring for chronic toxicities of chemotherapy 1. Severe fatigue: Resolved Patient is contemplating on getting pregnant. She had Covid 19 in December and has fully recovered without any residual effects from the virus.  Breast Cancer Surveillance: 1. Mammograms 02/17/2018: Benign    2. Breast Exam: 04/01/2019: Benign    RTC in 1 year for surveillance and follow up.  Patient may have moved to [place] and could transfer her care to breast clinic in [place].  We will send records as she requests.    No orders of the defined types were placed in this encounter.  The patient has a good understanding of the overall plan. she agrees with it. she will call with any problems that may develop before the next visit here.  Total time spent: 15 mins including face to face time and time spent for planning, charting and coordination of care  Gudena, Vinay, MD 04/02/2019  I, Molly Dorshimer, am acting as scribe for Dr. Vinay Gudena.  I have reviewed the above documentation for accuracy and completeness, and I agree with the above.       

## 2019-04-02 ENCOUNTER — Inpatient Hospital Stay: Payer: 59 | Attending: Hematology and Oncology | Admitting: Hematology and Oncology

## 2019-04-02 ENCOUNTER — Other Ambulatory Visit: Payer: Self-pay

## 2019-04-02 DIAGNOSIS — Z9221 Personal history of antineoplastic chemotherapy: Secondary | ICD-10-CM | POA: Diagnosis not present

## 2019-04-02 DIAGNOSIS — Z923 Personal history of irradiation: Secondary | ICD-10-CM | POA: Diagnosis not present

## 2019-04-02 DIAGNOSIS — C50412 Malignant neoplasm of upper-outer quadrant of left female breast: Secondary | ICD-10-CM | POA: Insufficient documentation

## 2019-04-02 DIAGNOSIS — Z171 Estrogen receptor negative status [ER-]: Secondary | ICD-10-CM

## 2019-04-02 DIAGNOSIS — Z79899 Other long term (current) drug therapy: Secondary | ICD-10-CM | POA: Insufficient documentation

## 2019-04-02 NOTE — Assessment & Plan Note (Signed)
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 Adjuvant chemotherapy with TCHPX 6 cycles followed by Herceptin-Perjetamaintenance for one year started 02/22/2016 completed 01/02/18 2. Followed by adjuvant radiation completed 08/13/2016 ---------------------------------------------------------------------------------------------------------------------------------------------------- Monitoring for chronic toxicities of chemotherapy 1. Severe fatigue: Much improved 2. Lack of sexual desire:Continues to be a problem  Breast Cancer Surveillance: 1. Mammograms 02/17/2018: Benign    2. Breast Exam: 04/01/2019: Benign    RTC in 1 year for surveillance and follow up.  Patient may have moved to [place] and could transfer her care to breast clinic in [place].  We will send records as she requests.

## 2019-05-10 ENCOUNTER — Other Ambulatory Visit: Payer: Self-pay | Admitting: Family

## 2020-04-03 ENCOUNTER — Telehealth: Payer: Self-pay | Admitting: Hematology and Oncology

## 2020-04-03 NOTE — Telephone Encounter (Signed)
Changed appt to mychart, called and spoke with pt, confirmed appt change

## 2020-04-03 NOTE — Telephone Encounter (Signed)
Contacted patient to verify mychart video visit for pre reg 

## 2020-04-03 NOTE — Progress Notes (Signed)
HEMATOLOGY-ONCOLOGY MYCHART VIDEO VISIT PROGRESS NOTE  I connected with Brianna James on 04/04/2020 at  9:00 AM EST by MyChart video conference and verified that I am speaking with the correct person using two identifiers.  I discussed the limitations, risks, security and privacy concerns of performing an evaluation and management service by MyChart and the availability of in person appointments.  I also discussed with the patient that there may be a patient responsible charge related to this service. The patient expressed understanding and agreed to proceed.  Patient's Location: Home Physician Location: Clinic  CHIEF COMPLIANT: Follow-upof left breast cancer  INTERVAL HISTORY: Brianna James is a 40 y.o. female with above-mentioned history of left breast cancer treated with lumpectomy,adjuvant chemotherapy,Herceptin Perjeta maintenance, and who is currently on surveillance.Mammogram on 07/02/19 showed no evidence of malignancy bilaterally. She presents over MyChart today for follow-up.  She is currently [redacted] weeks pregnant. She established oncology care in [place] with [facility]. She does not have any major problems or concerns. She has noticed some dark discoloration on her left breast. She will follow with her gynecologist/obstetrician on her next visit to have them take a look at it.  Oncology History  Malignant neoplasm of upper-outer quadrant of left breast in female, estrogen receptor negative ([place])  12/19/2015 Initial Diagnosis   Left breast biopsy: IDC with DCI S, grade 3, ER 0%, PR 0%, KI67 40%, her 2 positive ratio 6.15, copy number 16.5; palpable lump left breast 1.6 cm irregular oval mass, T1cN0 stage I a clinical stage   01/17/2016 Surgery   Left Lumpectomy: IDC 4.1 cm with DCIS, Margin 0.1 cm, 1/3 LN Positive, ER/PR Neg, Her 2 Pos with Ki 67 of 40%, T2N1 (stage 2B)   01/2016 Genetic Testing   Genetic tetsing was negative and found no deleterious mutations. Genes  tested include: ATM, BARD1, BRCA1, BRCA2, BRIP1, CDH1, CHEK2, FANCC, MLH1, MSH2, MSH6, NBN, PALB2, PMS2, PTEN, RAD51C, RAD51D, TP53, and XRCC2.  This panel also includes deletion/duplication analysis (without sequencing) for one gene, EPCAM.     02/22/2016 -  Chemotherapy   Adjuvant chemotherapy with TCH Perjeta 6 cycles followed by Herceptin Perjeta maintenance    07/02/2016 - 08/13/2016 Radiation Therapy   Adjuvant radiation therapy     Observations/Objective:  There were no vitals filed for this visit. There is no height or weight on file to calculate BMI.  I have reviewed the data as listed CMP Latest Ref Rng & Units 02/27/2017 01/02/2017 11/21/2016  Glucose 65 - 99 mg/dL 88 94 90  BUN 6 - 20 mg/dL 5(L) 8.7 11.0  Creatinine 0.44 - 1.00 mg/dL 0.97 1.0 1.1  Sodium 135 - 145 mmol/L 135 139 137  Potassium 3.5 - 5.1 mmol/L 3.9 4.4 4.2  Chloride 101 - 111 mmol/L 104 - -  CO2 22 - 32 mmol/L '22 27 28  ' Calcium 8.9 - 10.3 mg/dL 8.7(L) 9.0 9.7  Total Protein 6.4 - 8.3 g/dL - 7.3 7.5  Total Bilirubin 0.20 - 1.20 mg/dL - 0.39 0.52  Alkaline Phos 40 - 150 U/L - 81 83  AST 5 - 34 U/L - 19 20  ALT 0 - 55 U/L - 7 7    Lab Results  Component Value Date   WBC 8.0 02/27/2017   HGB 11.3 (L) 02/27/2017   HCT 35.4 (L) 02/27/2017   MCV 77.8 (L) 02/27/2017   PLT 247 02/27/2017   NEUTROABS 4.1 01/02/2017      Assessment Plan:  Malignant neoplasm of upper-outer  quadrant of left breast in female, estrogen receptor negative ([place]) 01/17/16: Left Lumpectomy: 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 Adjuvant chemotherapy with TCHPX 6 cycles followed by Herceptin-Perjetamaintenance for one year started 12/07/2017completed 01/02/18 2. Followed by adjuvant radiation completed  08/13/2016 ---------------------------------------------------------------------------------------------------------------------------------------------------- Patient has moved to [place] and is seeing Dr. Eber Jones at [facility] in [place]. She is currently [redacted] weeks pregnant and expecting to have a C-section in March 2022.Marland Kitchen She had Covid 19 in December 2020 and has fully recovered without any residual effects from the virus.  Breast Cancer Surveillance: 1. Mammograms 06/01/19 at [facility]: Benign    2. Breast Exam: 04/01/2019: Benign   Since she established oncology care in [place], we do not need to see her in the future. We wished her and her family the very best.    I discussed the assessment and treatment plan with the patient. The patient was provided an opportunity to ask questions and all were answered. The patient agreed with the plan and demonstrated an understanding of the instructions. The patient was advised to call back or seek an in-person evaluation if the symptoms worsen or if the condition fails to improve as anticipated.   I provided 20 minutes of face-to-face MyChart video visit time during this encounter.    Rulon Eisenmenger, MD 04/04/2020   I, Molly Dorshimer, am acting as scribe for Nicholas Lose, MD.  I have reviewed the above documentation for accuracy and completeness, and I agree with the above.

## 2020-04-03 NOTE — Assessment & Plan Note (Signed)
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 Adjuvant chemotherapy with TCHPX 6 cycles followed by Herceptin-Perjetamaintenance for one year started 12/07/2017completed 01/02/18 2. Followed by adjuvant radiation completed 08/13/2016 ---------------------------------------------------------------------------------------------------------------------------------------------------- Monitoring for chronic toxicities of chemotherapy 1. Severe fatigue: Resolved Patient is contemplating on getting pregnant. She had Covid 19 in December and has fully recovered without any residual effects from the virus.  Breast Cancer Surveillance: 1. Mammograms 02/17/2018: Benign    2. Breast Exam: 04/01/2019: Benign    RTC in 1 year for surveillance and follow up. Patient may have moved to [place] and could transfer her care to breast clinic in [place]. We will send records as she requests.

## 2020-04-04 ENCOUNTER — Inpatient Hospital Stay: Payer: 59 | Attending: Hematology and Oncology | Admitting: Hematology and Oncology

## 2020-04-04 DIAGNOSIS — C50412 Malignant neoplasm of upper-outer quadrant of left female breast: Secondary | ICD-10-CM

## 2020-04-04 DIAGNOSIS — Z171 Estrogen receptor negative status [ER-]: Secondary | ICD-10-CM

## 2021-02-14 ENCOUNTER — Telehealth: Payer: Self-pay | Admitting: *Deleted

## 2021-02-14 NOTE — Telephone Encounter (Signed)
Transition Care Management Unsuccessful Follow-up Telephone Call  Date of discharge and from where:  [facility]   02/04/2021  Attempts:  1st Attempt  Reason for unsuccessful TCM follow-up call:  No answer/busy  Jacqlyn Larsen [facility], BSN RN Case Manager [phone]

## 2021-02-15 ENCOUNTER — Telehealth: Payer: Self-pay | Admitting: *Deleted

## 2021-02-15 NOTE — Telephone Encounter (Signed)
Transition Care Management Unsuccessful Follow-up Telephone Call  Date of discharge and from where:  [facility]    02/04/2021  Attempts:  2nd Attempt  Reason for unsuccessful TCM follow-up call:  No answer/busy  Jacqlyn Larsen [facility], BSN RN Case Manager [phone]
